# Patient Record
Sex: Female | Born: 1950 | Race: Black or African American | Hispanic: No | Smoking: Never smoker
Health system: Southern US, Community
[De-identification: ages and names within clinical notes are randomized; demographics above are authoritative.]

## PROBLEM LIST (undated history)

## (undated) DIAGNOSIS — H409 Unspecified glaucoma: Secondary | ICD-10-CM

## (undated) DIAGNOSIS — G4730 Sleep apnea, unspecified: Secondary | ICD-10-CM

## (undated) DIAGNOSIS — Z9889 Other specified postprocedural states: Secondary | ICD-10-CM

## (undated) DIAGNOSIS — J309 Allergic rhinitis, unspecified: Secondary | ICD-10-CM

## (undated) DIAGNOSIS — E119 Type 2 diabetes mellitus without complications: Secondary | ICD-10-CM

## (undated) DIAGNOSIS — R112 Nausea with vomiting, unspecified: Secondary | ICD-10-CM

## (undated) DIAGNOSIS — J42 Unspecified chronic bronchitis: Secondary | ICD-10-CM

## (undated) DIAGNOSIS — K219 Gastro-esophageal reflux disease without esophagitis: Secondary | ICD-10-CM

## (undated) DIAGNOSIS — I10 Essential (primary) hypertension: Secondary | ICD-10-CM

## (undated) DIAGNOSIS — Z950 Presence of cardiac pacemaker: Secondary | ICD-10-CM

## (undated) DIAGNOSIS — I509 Heart failure, unspecified: Secondary | ICD-10-CM

## (undated) DIAGNOSIS — M109 Gout, unspecified: Secondary | ICD-10-CM

## (undated) DIAGNOSIS — M1990 Unspecified osteoarthritis, unspecified site: Secondary | ICD-10-CM

## (undated) DIAGNOSIS — R001 Bradycardia, unspecified: Secondary | ICD-10-CM

## (undated) HISTORY — DX: Unspecified glaucoma: H40.9

## (undated) HISTORY — PX: DILATION AND CURETTAGE OF UTERUS: SHX78

## (undated) HISTORY — DX: Allergic rhinitis, unspecified: J30.9

## (undated) HISTORY — DX: Heart failure, unspecified: I50.9

## (undated) HISTORY — DX: Gout, unspecified: M10.9

## (undated) HISTORY — PX: BREAST BIOPSY: SHX20

## (undated) HISTORY — PX: TUBAL LIGATION: SHX77

## (undated) HISTORY — PX: BREAST LUMPECTOMY: SHX2

## (undated) HISTORY — PX: ABDOMINAL HYSTERECTOMY: SHX81

---

## 1998-09-15 DIAGNOSIS — Z9189 Other specified personal risk factors, not elsewhere classified: Secondary | ICD-10-CM | POA: Insufficient documentation

## 1998-12-29 ENCOUNTER — Emergency Department (HOSPITAL_COMMUNITY): Admission: EM | Admit: 1998-12-29 | Discharge: 1998-12-29 | Payer: Self-pay | Admitting: Emergency Medicine

## 1999-02-01 ENCOUNTER — Emergency Department (HOSPITAL_COMMUNITY): Admission: EM | Admit: 1999-02-01 | Discharge: 1999-02-01 | Payer: Self-pay | Admitting: Emergency Medicine

## 1999-11-26 ENCOUNTER — Emergency Department (HOSPITAL_COMMUNITY): Admission: EM | Admit: 1999-11-26 | Discharge: 1999-11-26 | Payer: Self-pay | Admitting: Emergency Medicine

## 1999-11-26 ENCOUNTER — Encounter: Payer: Self-pay | Admitting: Emergency Medicine

## 2000-08-19 ENCOUNTER — Emergency Department (HOSPITAL_COMMUNITY): Admission: EM | Admit: 2000-08-19 | Discharge: 2000-08-19 | Payer: Self-pay | Admitting: Emergency Medicine

## 2001-01-03 HISTORY — PX: GANGLION CYST EXCISION: SHX1691

## 2001-01-16 ENCOUNTER — Encounter: Payer: Self-pay | Admitting: *Deleted

## 2001-01-16 ENCOUNTER — Ambulatory Visit (HOSPITAL_COMMUNITY): Admission: RE | Admit: 2001-01-16 | Discharge: 2001-01-16 | Payer: Self-pay | Admitting: *Deleted

## 2001-01-27 ENCOUNTER — Emergency Department (HOSPITAL_COMMUNITY): Admission: EM | Admit: 2001-01-27 | Discharge: 2001-01-27 | Payer: Self-pay | Admitting: Emergency Medicine

## 2001-01-27 ENCOUNTER — Encounter: Payer: Self-pay | Admitting: Emergency Medicine

## 2001-04-23 ENCOUNTER — Ambulatory Visit (HOSPITAL_BASED_OUTPATIENT_CLINIC_OR_DEPARTMENT_OTHER): Admission: RE | Admit: 2001-04-23 | Discharge: 2001-04-23 | Payer: Self-pay | Admitting: General Surgery

## 2001-04-23 ENCOUNTER — Encounter (INDEPENDENT_AMBULATORY_CARE_PROVIDER_SITE_OTHER): Payer: Self-pay | Admitting: *Deleted

## 2001-08-18 ENCOUNTER — Emergency Department (HOSPITAL_COMMUNITY): Admission: EM | Admit: 2001-08-18 | Discharge: 2001-08-18 | Payer: Self-pay | Admitting: Emergency Medicine

## 2001-08-18 ENCOUNTER — Encounter: Payer: Self-pay | Admitting: Emergency Medicine

## 2001-12-23 ENCOUNTER — Encounter: Payer: Self-pay | Admitting: Emergency Medicine

## 2001-12-23 ENCOUNTER — Emergency Department (HOSPITAL_COMMUNITY): Admission: EM | Admit: 2001-12-23 | Discharge: 2001-12-23 | Payer: Self-pay | Admitting: Emergency Medicine

## 2002-03-24 ENCOUNTER — Emergency Department (HOSPITAL_COMMUNITY): Admission: EM | Admit: 2002-03-24 | Discharge: 2002-03-24 | Payer: Self-pay | Admitting: Emergency Medicine

## 2002-03-24 ENCOUNTER — Encounter: Payer: Self-pay | Admitting: Emergency Medicine

## 2002-11-03 ENCOUNTER — Emergency Department (HOSPITAL_COMMUNITY): Admission: EM | Admit: 2002-11-03 | Discharge: 2002-11-04 | Payer: Self-pay | Admitting: Emergency Medicine

## 2002-12-22 ENCOUNTER — Emergency Department (HOSPITAL_COMMUNITY): Admission: EM | Admit: 2002-12-22 | Discharge: 2002-12-22 | Payer: Self-pay | Admitting: Emergency Medicine

## 2003-03-16 ENCOUNTER — Emergency Department (HOSPITAL_COMMUNITY): Admission: AD | Admit: 2003-03-16 | Discharge: 2003-03-16 | Payer: Self-pay | Admitting: Family Medicine

## 2003-07-13 ENCOUNTER — Emergency Department (HOSPITAL_COMMUNITY): Admission: EM | Admit: 2003-07-13 | Discharge: 2003-07-13 | Payer: Self-pay | Admitting: Emergency Medicine

## 2003-09-12 ENCOUNTER — Ambulatory Visit: Payer: Self-pay | Admitting: Internal Medicine

## 2003-09-30 ENCOUNTER — Ambulatory Visit: Payer: Self-pay | Admitting: *Deleted

## 2003-10-27 ENCOUNTER — Ambulatory Visit: Payer: Self-pay | Admitting: Family Medicine

## 2003-12-06 ENCOUNTER — Emergency Department (HOSPITAL_COMMUNITY): Admission: EM | Admit: 2003-12-06 | Discharge: 2003-12-06 | Payer: Self-pay | Admitting: Family Medicine

## 2004-02-22 ENCOUNTER — Emergency Department (HOSPITAL_COMMUNITY): Admission: EM | Admit: 2004-02-22 | Discharge: 2004-02-22 | Payer: Self-pay | Admitting: Emergency Medicine

## 2004-05-06 ENCOUNTER — Ambulatory Visit: Payer: Self-pay | Admitting: Family Medicine

## 2004-08-28 ENCOUNTER — Emergency Department (HOSPITAL_COMMUNITY): Admission: EM | Admit: 2004-08-28 | Discharge: 2004-08-28 | Payer: Self-pay | Admitting: Family Medicine

## 2004-08-28 ENCOUNTER — Emergency Department (HOSPITAL_COMMUNITY): Admission: EM | Admit: 2004-08-28 | Discharge: 2004-08-28 | Payer: Self-pay | Admitting: Emergency Medicine

## 2004-10-28 ENCOUNTER — Ambulatory Visit: Payer: Self-pay | Admitting: Family Medicine

## 2005-02-09 ENCOUNTER — Emergency Department (HOSPITAL_COMMUNITY): Admission: EM | Admit: 2005-02-09 | Discharge: 2005-02-09 | Payer: Self-pay | Admitting: Emergency Medicine

## 2005-04-07 ENCOUNTER — Emergency Department (HOSPITAL_COMMUNITY): Admission: EM | Admit: 2005-04-07 | Discharge: 2005-04-07 | Payer: Self-pay | Admitting: Emergency Medicine

## 2005-06-24 ENCOUNTER — Ambulatory Visit: Payer: Self-pay | Admitting: Internal Medicine

## 2005-10-26 ENCOUNTER — Ambulatory Visit: Payer: Self-pay | Admitting: Family Medicine

## 2006-01-04 ENCOUNTER — Emergency Department (HOSPITAL_COMMUNITY): Admission: EM | Admit: 2006-01-04 | Discharge: 2006-01-04 | Payer: Self-pay | Admitting: Family Medicine

## 2006-04-12 ENCOUNTER — Ambulatory Visit: Payer: Self-pay | Admitting: Internal Medicine

## 2006-09-15 DIAGNOSIS — J309 Allergic rhinitis, unspecified: Secondary | ICD-10-CM | POA: Insufficient documentation

## 2006-09-15 DIAGNOSIS — I10 Essential (primary) hypertension: Secondary | ICD-10-CM | POA: Insufficient documentation

## 2006-09-19 DIAGNOSIS — J45909 Unspecified asthma, uncomplicated: Secondary | ICD-10-CM

## 2006-09-20 ENCOUNTER — Encounter (INDEPENDENT_AMBULATORY_CARE_PROVIDER_SITE_OTHER): Payer: Self-pay | Admitting: *Deleted

## 2006-10-25 ENCOUNTER — Emergency Department (HOSPITAL_COMMUNITY): Admission: EM | Admit: 2006-10-25 | Discharge: 2006-10-25 | Payer: Self-pay | Admitting: Family Medicine

## 2006-11-01 ENCOUNTER — Encounter: Payer: Self-pay | Admitting: Internal Medicine

## 2006-11-01 ENCOUNTER — Ambulatory Visit: Payer: Self-pay | Admitting: Nurse Practitioner

## 2007-06-05 ENCOUNTER — Telehealth (INDEPENDENT_AMBULATORY_CARE_PROVIDER_SITE_OTHER): Payer: Self-pay | Admitting: Internal Medicine

## 2007-06-13 ENCOUNTER — Encounter (INDEPENDENT_AMBULATORY_CARE_PROVIDER_SITE_OTHER): Payer: Self-pay | Admitting: *Deleted

## 2007-07-09 ENCOUNTER — Emergency Department (HOSPITAL_COMMUNITY): Admission: EM | Admit: 2007-07-09 | Discharge: 2007-07-09 | Payer: Self-pay | Admitting: Emergency Medicine

## 2007-07-26 ENCOUNTER — Ambulatory Visit: Payer: Self-pay | Admitting: Internal Medicine

## 2007-07-26 LAB — CONVERTED CEMR LAB
Blood in Urine, dipstick: NEGATIVE
Glucose, Urine, Semiquant: NEGATIVE
Nitrite: NEGATIVE
Protein, U semiquant: 30
Specific Gravity, Urine: 1.025
Urobilinogen, UA: 0.2

## 2007-07-30 ENCOUNTER — Emergency Department (HOSPITAL_COMMUNITY): Admission: EM | Admit: 2007-07-30 | Discharge: 2007-07-30 | Payer: Self-pay | Admitting: Emergency Medicine

## 2008-03-14 ENCOUNTER — Ambulatory Visit: Payer: Self-pay | Admitting: Internal Medicine

## 2008-03-14 DIAGNOSIS — B370 Candidal stomatitis: Secondary | ICD-10-CM

## 2008-06-17 ENCOUNTER — Ambulatory Visit: Payer: Self-pay | Admitting: Internal Medicine

## 2008-06-17 DIAGNOSIS — J019 Acute sinusitis, unspecified: Secondary | ICD-10-CM

## 2008-09-27 ENCOUNTER — Emergency Department (HOSPITAL_COMMUNITY): Admission: EM | Admit: 2008-09-27 | Discharge: 2008-09-27 | Payer: Self-pay | Admitting: Emergency Medicine

## 2009-01-18 ENCOUNTER — Emergency Department (HOSPITAL_COMMUNITY): Admission: EM | Admit: 2009-01-18 | Discharge: 2009-01-18 | Payer: Self-pay | Admitting: Emergency Medicine

## 2009-02-10 ENCOUNTER — Ambulatory Visit: Payer: Self-pay | Admitting: Internal Medicine

## 2009-02-10 DIAGNOSIS — J209 Acute bronchitis, unspecified: Secondary | ICD-10-CM

## 2009-06-22 ENCOUNTER — Ambulatory Visit: Payer: Self-pay | Admitting: Internal Medicine

## 2009-09-24 ENCOUNTER — Ambulatory Visit: Payer: Self-pay | Admitting: Internal Medicine

## 2010-01-05 ENCOUNTER — Ambulatory Visit
Admission: RE | Admit: 2010-01-05 | Discharge: 2010-01-05 | Payer: Self-pay | Source: Home / Self Care | Attending: Internal Medicine | Admitting: Internal Medicine

## 2010-01-20 ENCOUNTER — Ambulatory Visit: Admit: 2010-01-20 | Payer: Self-pay | Admitting: Internal Medicine

## 2010-01-27 ENCOUNTER — Emergency Department (HOSPITAL_COMMUNITY)
Admission: EM | Admit: 2010-01-27 | Discharge: 2010-01-27 | Payer: Self-pay | Source: Home / Self Care | Admitting: Family Medicine

## 2010-02-04 NOTE — Assessment & Plan Note (Signed)
Summary: F/U 2 MONTHS ASTHMA & HTN PER DR MULBERRY / NS   Vital Signs:  Patient profile:   60 year old female Height:      64.5 inches Weight:      209 pounds BMI:     35.45 Temp:     97.4 degrees F oral Pulse rate:   72 / minute Pulse rhythm:   regular BP sitting:   124 / 80  (left arm) Cuff size:   large  Vitals Entered By: CMA Student Linzie Collin CC: F/U for HTN and Asthma, patient has been conjested for about a week with cough Is Patient Diabetic? No Pain Assessment Patient in pain? no       Does patient need assistance? Functional Status Self care Ambulation Normal   CC:  F/U for HTN and Asthma and patient has been conjested for about a week with cough.  History of Present Illness: 1.  Hypertension:  Taking Lisinopril.  No problems.  Later, find out she is still taking HCTZ--was to change to as needed use only.  For some reason, is no longer having problems with frequent urination with this, so no problem now.  Has also cut back on eating.  No significant physical activity.  2.  Asthma:  Using Advair two times a day now.  Has helped, but still with fairly significant symptoms.  States using Singulair, nasal steroids and Xyzal daily as well.  When temp dropped lower last week, developed cough, some congestion, dypsnea, chest feels tighter more of the time.  Asthma History    Asthma Control Assessment:    Age range: 12+ years    Symptoms: 0-2 days/week    Nighttime Awakenings: 1-3/week    Interferes w/ normal activity: some limitations    SABA use (not for EIB): >2 days/week    Asthma Control Assessment: Not Well Controlled   Current Medications (verified): 1)  Singulair 10 Mg  Tabs (Montelukast Sodium) 2)  Advair Diskus 250-50 Mcg/dose  Misc (Fluticasone-Salmeterol) .... One Puff Two Times A Day 3)  Hydrochlorothiazide 25 Mg  Tabs (Hydrochlorothiazide) .Marland Kitchen.. 1 Tab By Mouth Daily As Needed Edema--As Needed Only 4)  Allegra 180 Mg  Tabs (Fexofenadine Hcl) ....  One By Mouth Qam 5)  Norvasc 10 Mg  Tabs (Amlodipine Besylate) .Marland Kitchen.. 1 Tab By Mouth Daily 6)  Ventolin Hfa 108 (90 Base) Mcg/act  Aers (Albuterol Sulfate) .... 2 Puffs Q 4 Hours As Needed Wheeze and Chest Tightness 7)  Nasonex 50 Mcg/act Susp (Mometasone Furoate) .... 2 Sprays Each Nostril Daily 8)  Lisinopril 10 Mg Tabs (Lisinopril) .Marland Kitchen.. 1 Tab By Mouth Daily  Allergies (verified): 1)  ! Pcn 2)  ! * Avelox  Physical Exam  General:  Crying after receiving an emergency call from family--20 yo ?grandson apparently arrested--pt. very upset and needed to leave quickly Ears:  External ear exam shows no significant lesions or deformities.  Otoscopic examination reveals clear canals, tympanic membranes are intact bilaterally without bulging, retraction, inflammation or discharge. Hearing is grossly normal bilaterally. Nose:  clear discharge, mild swelling of mucosa Mouth:  pharynx pink and moist.   Neck:  No deformities, masses, or tenderness noted. Lungs:  Normal respiratory effort, chest expands symmetrically. Lungs are clear to auscultation, no crackles or wheezes.   Heart:  Normal rate and regular rhythm. S1 and S2 normal without gallop, murmur, click, rub or other extra sounds.   Impression & Recommendations:  Problem # 1:  HYPERTENSION (ICD-401.9) Better control--stay on all 3  meds--to return at later date for labs Her updated medication list for this problem includes:    Hydrochlorothiazide 25 Mg Tabs (Hydrochlorothiazide) .Marland Kitchen... 1 tab by mouth daily    Norvasc 10 Mg Tabs (Amlodipine besylate) .Marland Kitchen... 1 tab by mouth daily    Lisinopril 10 Mg Tabs (Lisinopril) .Marland Kitchen... 1 tab by mouth daily  Problem # 2:  ASTHMA (ICD-493.90) Not controlled Increase to Advair 500/50 two times a day  The following medications were removed from the medication list:    Advair Diskus 250-50 Mcg/dose Misc (Fluticasone-salmeterol) ..... One puff two times a day Her updated medication list for this problem  includes:    Singulair 10 Mg Tabs (Montelukast sodium)    Ventolin Hfa 108 (90 Base) Mcg/act Aers (Albuterol sulfate) .Marland Kitchen... 2 puffs q 4 hours as needed wheeze and chest tightness    Flovent Diskus 250 Mcg/blist Aepb (Fluticasone propionate (inhal)) .Marland Kitchen... 1 inhalation two times a day with advair 250 until can get 500 mg disc advair    Advair Diskus 500-50 Mcg/dose Aepb (Fluticasone-salmeterol) .Marland Kitchen... 1 inhalation two times a day  Complete Medication List: 1)  Singulair 10 Mg Tabs (Montelukast sodium) 2)  Hydrochlorothiazide 25 Mg Tabs (Hydrochlorothiazide) .Marland Kitchen.. 1 tab by mouth daily 3)  Xyzal 5 Mg Tabs (Levocetirizine dihydrochloride) .Marland Kitchen.. 1 tab by mouth daily 4)  Norvasc 10 Mg Tabs (Amlodipine besylate) .Marland Kitchen.. 1 tab by mouth daily 5)  Ventolin Hfa 108 (90 Base) Mcg/act Aers (Albuterol sulfate) .... 2 puffs q 4 hours as needed wheeze and chest tightness 6)  Nasonex 50 Mcg/act Susp (Mometasone furoate) .... 2 sprays each nostril daily 7)  Lisinopril 10 Mg Tabs (Lisinopril) .Marland Kitchen.. 1 tab by mouth daily 8)  Flovent Diskus 250 Mcg/blist Aepb (Fluticasone propionate (inhal)) .Marland Kitchen.. 1 inhalation two times a day with advair 250 until can get 500 mg disc advair 9)  Advair Diskus 500-50 Mcg/dose Aepb (Fluticasone-salmeterol) .Marland Kitchen.. 1 inhalation two times a day  Patient Instructions: 1)  Lab visit in next 2 weeks for BMET--v58.69 2)  Follow up with Dr. Delrae Alfred in 2 months --asthma 3)  NO ONE SHOULD SMOKE IN THE HOUSE OR CAR Prescriptions: HYDROCHLOROTHIAZIDE 25 MG  TABS (HYDROCHLOROTHIAZIDE) 1 tab by mouth daily  #30 x 11   Entered and Authorized by:   Julieanne Manson MD   Signed by:   Julieanne Manson MD on 09/24/2009   Method used:   Faxed to ...       [facility] - [facility] (retail)       [address] [place], [place]  [address]       Ph: [phone]       Fax: [phone]   RxID:   [id number] ADVAIR DISKUS 500-50 MCG/DOSE AEPB (FLUTICASONE-SALMETEROL) 1  inhalation two times a day  #1 x 11   Entered and Authorized by:   Julieanne Manson MD   Signed by:   Julieanne Manson MD on 09/24/2009   Method used:   Faxed to ...       [facility] - [facility] (retail)       [address] [place], [place]  [address]       Ph: [phone]       Fax: [phone]   RxID:   [id number] FLOVENT DISKUS 250 MCG/BLIST AEPB (FLUTICASONE PROPIONATE (INHAL)) 1 inhalation two times a day with Advair 250 until can get 500 mg disc Advair  #2 x 0  Entered and Authorized by:   Julieanne Manson MD   Signed by:   Julieanne Manson MD on 09/24/2009   Method used:   Samples Given   RxID:   [id number]

## 2010-02-04 NOTE — Assessment & Plan Note (Signed)
Summary: FOLLOW UP VISIT/ GK   Vital Signs:  Patient profile:   60 year old female Weight:      216.5 pounds Temp:     97.7 degrees F Pulse rate:   79 / minute Pulse rhythm:   regular Resp:     20 per minute BP sitting:   156 / 99  (left arm) Cuff size:   large  Vitals Entered By: Vesta Mixer CMA (February 10, 2009 3:54 PM) CC: Went to ED 01/18/09 was told she had bronchitis and feels like it has not gone away completely. Is Patient Diabetic? No Pain Assessment Patient in pain? no       Does patient need assistance? Ambulation Normal   CC:  Went to ED 01/18/09 was told she had bronchitis and feels like it has not gone away completely.Marland Kitchen  History of Present Illness: Went to ED at [facility] 01/18/2009 for cough and pt. also felt she was having palpitations and chest discomfort.  Pt. diagnosed with Asthmatic bronchitis.  Was given prednisone burst and taper, doxycycline and nebulized albuterol.  Interestingly, no note of wheezing in ED provider PE.  Was better when was taking the Prednisone.  Coughing has worsened since about 2 days after prednisone.  Looking through med refill record from pharmacy, pt. has not filled meds at least 4 months in last year--she states she was using meds from previous refills--but those were also at [facility] pharmacy--my refills should have cancelled out the old.    No fever.  No further palpitations.  Discussed her use of rescue inhalers may have caused the palpitations--EKG reportedly okay in ED--she was told this was from the asthma.  Pt. does admit that she was a bit shaky/tremulous as well.  Allergies (verified): 1)  ! Pcn 2)  ! * Avelox  Physical Exam  Ears:  External ear exam shows no significant lesions or deformities.  Otoscopic examination reveals clear canals, tympanic membranes are intact bilaterally without bulging, retraction, inflammation or discharge. Hearing is grossly normal bilaterally. Nose:  External nasal examination shows  no deformity or inflammation. Nasal mucosa are pink and moist without lesions or exudates. Mouth:  pharynx pink and moist.   Neck:  No deformities, masses, or tenderness noted. Lungs:  Normal respiratory effort, chest expands symmetrically. Lungs are clear to auscultation, no crackles or wheezes. Heart:  Normal rate and regular rhythm. S1 and S2 normal without gallop, murmur, click, rub or other extra sounds.  Radial pulses normal and equal.   Impression & Recommendations:  Problem # 1:  ACUTE BRONCHITIS (ICD-466.0) No wheezing on exam today-feel she is improving and needs something to tame dry cough--start Tessalon perles. Pt. to get back on meds and stay on them. Her updated medication list for this problem includes:    Singulair 10 Mg Tabs (Montelukast sodium)    Advair Diskus 250-50 Mcg/dose Misc (Fluticasone-salmeterol) ..... One puff two times a day    Ventolin Hfa 108 (90 Base) Mcg/act Aers (Albuterol sulfate) .Marland Kitchen... 2 puffs q 4 hours as needed wheeze and chest tightness    Tessalon Perles 100 Mg Caps (Benzonatate) .Marland Kitchen... 1 perle by mouth every 8 hours as needed for cough  Problem # 2:  ASTHMA (ICD-493.90) As above Her updated medication list for this problem includes:    Singulair 10 Mg Tabs (Montelukast sodium)    Advair Diskus 250-50 Mcg/dose Misc (Fluticasone-salmeterol) ..... One puff two times a day    Ventolin Hfa 108 (90 Base) Mcg/act Aers (Albuterol  sulfate) .Marland Kitchen... 2 puffs q 4 hours as needed wheeze and chest tightness  Problem # 3:  HYPERTENSION (ICD-401.9) Again, discussed compliance. Her updated medication list for this problem includes:    Hydrochlorothiazide 25 Mg Tabs (Hydrochlorothiazide) .Marland Kitchen... 1 tab by mouth daily as needed edema    Norvasc 10 Mg Tabs (Amlodipine besylate) .Marland Kitchen... 1 tab by mouth daily  Complete Medication List: 1)  Singulair 10 Mg Tabs (Montelukast sodium) 2)  Advair Diskus 250-50 Mcg/dose Misc (Fluticasone-salmeterol) .... One puff two times a  day 3)  Hydrochlorothiazide 25 Mg Tabs (Hydrochlorothiazide) .Marland Kitchen.. 1 tab by mouth daily as needed edema 4)  Allegra 180 Mg Tabs (Fexofenadine hcl) .... One by mouth qam 5)  Norvasc 10 Mg Tabs (Amlodipine besylate) .Marland Kitchen.. 1 tab by mouth daily 6)  Ventolin Hfa 108 (90 Base) Mcg/act Aers (Albuterol sulfate) .... 2 puffs q 4 hours as needed wheeze and chest tightness 7)  Nasonex 50 Mcg/act Susp (Mometasone furoate) .... 2 sprays each nostril daily 8)  Nystatin 100000 Unit/ml Susp (Nystatin) .... 5 cc swish and swallow 4 times daily for 10 days 9)  Tessalon Perles 100 Mg Caps (Benzonatate) .Marland Kitchen.. 1 perle by mouth every 8 hours as needed for cough  Patient Instructions: 1)  Follow up with Dr. Delrae Alfred in 4 months --asthma Prescriptions: TESSALON PERLES 100 MG CAPS (BENZONATATE) 1 perle by mouth every 8 hours as needed for cough  #30 x 0   Entered and Authorized by:   Julieanne Manson MD   Signed by:   Julieanne Manson MD on 02/10/2009   Method used:   Faxed to ...       [facility] - [facility] (retail)       [address] [place], [place]  [address]       Ph: [phone]       Fax: [phone]   RxID:   [id number]    Vital Signs:  Patient profile:   60 year old female Weight:      216.5 pounds Temp:     97.7 degrees F Pulse rate:   79 / minute Pulse rhythm:   regular Resp:     20 per minute BP sitting:   156 / 99  (left arm) Cuff size:   large  Vitals Entered By: Vesta Mixer CMA (February 10, 2009 3:54 PM)

## 2010-02-04 NOTE — Assessment & Plan Note (Signed)
Summary: 4 month fu onasthma////kt//RESCH//GK   Vital Signs:  Patient profile:   60 year old female Weight:      215 pounds BMI:     36.47 Temp:     97.7 degrees F Pulse rate:   59 / minute Pulse rhythm:   regular Resp:     20 per minute BP sitting:   140 / 79  (left arm) Cuff size:   large  Vitals Entered By: Vesta Mixer CMA (June 22, 2009 10:58 AM)  Serial Vital Signs/Assessments:  Comments: Peak flows:  350 L/min   370 L/min  390 L/min By: Julieanne Manson MD   CC: f/u onasthma Is Patient Diabetic? No Pain Assessment Patient in pain? no       Does patient need assistance? Ambulation Normal   CC:  f/u onasthma.  History of Present Illness: 1.  Hypertension:  MIsses HCTZ frequently as it makes her urinate and she's afraid to leave home.   Cannot recall what meds she has tried in past--had a beta blocker that caused nausea and vomiting.  Cannot recall the name Lisinopril.  2.  Asthma:  Advair--only using once daily, sometimes twice.  Thinks she would use a rescue inhaler every day if she had one--out currently.  Still coughing a fair amt.  Feels short of breath a bit every day.  3.  Allergies:  has a lot of trouble with allergies currently and particularly when very hot.  States she is using her Singulair, Allegra, Nasocort regularly.    Asthma History    Initial Asthma Severity Rating:    Age range: 12+ years    Symptoms: daily    Nighttime Awakenings: >1/week but not nightly    Interferes w/ normal activity: some limitations    SABA use (not for EIB): daily    Asthma Severity Assessment: Moderate Persistent    Allergies (verified): 1)  ! Pcn 2)  ! * Avelox  Physical Exam  General:  NAD Eyes:  No corneal or conjunctival inflammation noted. EOMI. Perrla. Funduscopic exam benign, without hemorrhages, exudates or papilledema. Vision grossly normal. Ears:  External ear exam shows no significant lesions or deformities.  Otoscopic examination reveals  clear canals, tympanic membranes are intact bilaterally without bulging, retraction, inflammation or discharge. Hearing is grossly normal bilaterally. Nose:  nasal dischargemucosal pallor.  Some dryness and irritation of mucosa Mouth:  pharynx pink and moist.   Neck:  No deformities, masses, or tenderness noted. Lungs:  Normal respiratory effort, chest expands symmetrically. Lungs are clear to auscultation, no crackles or wheezes.  Air movement currently good. Heart:  Normal rate and regular rhythm. S1 and S2 normal without gallop, murmur, click, rub or other extra sounds.   Impression & Recommendations:  Problem # 1:  HYPERTENSION (ICD-401.9) HCTZ to be just as needed for edema Start Lisinopril--hopefully will tolerate so she can take daily Her updated medication list for this problem includes:    Hydrochlorothiazide 25 Mg Tabs (Hydrochlorothiazide) .Marland Kitchen... 1 tab by mouth daily as needed edema--as needed only    Norvasc 10 Mg Tabs (Amlodipine besylate) .Marland Kitchen... 1 tab by mouth daily    Lisinopril 10 Mg Tabs (Lisinopril) .Marland Kitchen... 1 tab by mouth daily  Problem # 2:  ASTHMA (ICD-493.90) Discussed needs to use Advair two times a day every day Plan sheet printed and given Her updated medication list for this problem includes:    Singulair 10 Mg Tabs (Montelukast sodium)    Advair Diskus 250-50 Mcg/dose Misc (Fluticasone-salmeterol) ..... One  puff two times a day    Ventolin Hfa 108 (90 Base) Mcg/act Aers (Albuterol sulfate) .Marland Kitchen... 2 puffs q 4 hours as needed wheeze and chest tightness  Problem # 3:  ALLERGIC RHINITIS (ICD-477.9) Add saline nasal spray as needed  Her updated medication list for this problem includes:    Allegra 180 Mg Tabs (Fexofenadine hcl) ..... One by mouth qam    Nasonex 50 Mcg/act Susp (Mometasone furoate) .Marland Kitchen... 2 sprays each nostril daily  Complete Medication List: 1)  Singulair 10 Mg Tabs (Montelukast sodium) 2)  Advair Diskus 250-50 Mcg/dose Misc (Fluticasone-salmeterol)  .... One puff two times a day 3)  Hydrochlorothiazide 25 Mg Tabs (Hydrochlorothiazide) .Marland Kitchen.. 1 tab by mouth daily as needed edema--as needed only 4)  Allegra 180 Mg Tabs (Fexofenadine hcl) .... One by mouth qam 5)  Norvasc 10 Mg Tabs (Amlodipine besylate) .Marland Kitchen.. 1 tab by mouth daily 6)  Ventolin Hfa 108 (90 Base) Mcg/act Aers (Albuterol sulfate) .... 2 puffs q 4 hours as needed wheeze and chest tightness 7)  Nasonex 50 Mcg/act Susp (Mometasone furoate) .... 2 sprays each nostril daily 8)  Lisinopril 10 Mg Tabs (Lisinopril) .Marland Kitchen.. 1 tab by mouth daily  Asthma Management Plan    Asthma Severity: Moderate Persistent    Personal best PEF: 390 liters/minute    Predicted PEF: 442 liters/minute    Working PEF: 390 liters/minute    Plan based on PEF formula: [place] [employer] Zone: (Range: 350 to 450) SINGULAIR 10 MG  TABS ADVAIR DISKUS 250-50 MCG/DOSE  MISC   Yellow Zone: VENTOLIN HFA 108 (90 BASE) MCG/ACT  AERS:  2 puffs every 4 hours as needed  Red Zone: VENTOLIN HFA 108 (90 BASE) MCG/ACT  AERS Call your physician for shortness of breath.    Patient Instructions: 1)  BP check with BMET in 2 weeks. 2)  Follow up with Dr. Delrae Alfred in 2 months-asthma and htn Prescriptions: VENTOLIN HFA 108 (90 BASE) MCG/ACT  AERS (ALBUTEROL SULFATE) 2 puffs q 4 hours as needed wheeze and chest tightness  #1 x 0   Entered and Authorized by:   Julieanne Manson MD   Signed by:   Julieanne Manson MD on 06/22/2009   Method used:   Faxed to ...       [facility] - [facility] (retail)       [address] [place], [place]  [address]       Ph: [phone]       Fax: [phone]   RxID:   [id number] HYDROCHLOROTHIAZIDE 25 MG  TABS (HYDROCHLOROTHIAZIDE) 1 tab by mouth daily as needed edema--as needed only  #30 x 1   Entered and Authorized by:   Julieanne Manson MD   Signed by:   Julieanne Manson MD on 06/22/2009   Method used:   Faxed to ...       [facility] - [facility] (retail)       [address] [place], [place]  [address]       Ph: [phone]       Fax: [phone]   RxID:   [id number] LISINOPRIL 10 MG TABS (LISINOPRIL) 1 tab by mouth daily  #30 x 6   Entered and Authorized by:   Julieanne Manson MD   Signed by:   Julieanne Manson MD on 06/22/2009   Method used:   Faxed to .Marland KitchenMarland Kitchen  [facility] - [facility] (retail)       [address] [place], [place]  [address]       Ph: [phone]       Fax: [phone]   RxID:   [id number]   Appended Document: 4 month fu onasthma////kt//RESCH//GK nonsmoker

## 2010-02-04 NOTE — Assessment & Plan Note (Signed)
Summary: FU FOR ASTHMA AND BMET///KT   Vital Signs:  Patient profile:   60 year old female Weight:      209.13 pounds O2 Sat:      98 % on Room air Temp:     98.2 degrees F oral Pulse rate:   84 / minute Pulse rhythm:   regular Resp:     20 per minute BP sitting:   142 / 100  (left arm) Cuff size:   regular  Vitals Entered By: Hale Drone CMA (January 05, 2010 3:30 PM)  O2 Flow:  Room air CC: f/u on asthma and BMET.  Is Patient Diabetic? No Pain Assessment Patient in pain? no       Does patient need assistance? Functional Status Self care Ambulation Normal   Serial Vital Signs/Assessments:                                PEF    PreRx  PostRx Time      O2 Sat  O2 Type     L/min  L/min  L/min   By 3:35 PM   98  %   Room air                          Hale Drone CMA  Comments: 3:35 PM Peak Flow: 400 - 380 - 480 By: Hale Drone CMA    CC:  f/u on asthma and BMET. Marland Kitchen  History of Present Illness: 1.  Asthma:  Feels she is doing better.  Feels doing much better since started higher dosing of Advair.  Using rescue inhaler only 3-4 times weekly. Was using daily previously.  Denies missing asthma meds.    2.  Hypertension:  Has been missing meds and eating a lot of salty foods.    Asthma History    Asthma Control Assessment:    Age range: 12+ years    Symptoms: >2 days/week    Nighttime Awakenings: 0-2/month    Interferes w/ normal activity: no limitations    SABA use (not for EIB): >2 days/week    Asthma Control Assessment: Not Well Controlled    Current Medications (verified): 1)  Singulair 10 Mg  Tabs (Montelukast Sodium) 2)  Hydrochlorothiazide 25 Mg  Tabs (Hydrochlorothiazide) .Marland Kitchen.. 1 Tab By Mouth Daily 3)  Xyzal 5 Mg Tabs (Levocetirizine Dihydrochloride) .Marland Kitchen.. 1 Tab By Mouth Daily 4)  Norvasc 10 Mg  Tabs (Amlodipine Besylate) .Marland Kitchen.. 1 Tab By Mouth Daily 5)  Ventolin Hfa 108 (90 Base) Mcg/act  Aers (Albuterol Sulfate) .... 2 Puffs Q 4 Hours As Needed Wheeze and  Chest Tightness 6)  Nasonex 50 Mcg/act Susp (Mometasone Furoate) .... 2 Sprays Each Nostril Daily 7)  Lisinopril 10 Mg Tabs (Lisinopril) .Marland Kitchen.. 1 Tab By Mouth Daily 8)  Flovent Diskus 250 Mcg/blist Aepb (Fluticasone Propionate (Inhal)) .Marland Kitchen.. 1 Inhalation Two Times A Day With Advair 250 Until Can Get 500 Mg Disc Advair 9)  Advair Diskus 500-50 Mcg/dose Aepb (Fluticasone-Salmeterol) .Marland Kitchen.. 1 Inhalation Two Times A Day  Allergies (verified): 1)  ! Pcn 2)  ! * Avelox  Physical Exam  General:  NAD Lungs:  Normal respiratory effort, chest expands symmetrically. Lungs are clear to auscultation, no crackles or wheezes. Heart:  Normal rate and regular rhythm. S1 and S2 normal without gallop, murmur, click, rub or other extra sounds.  Radial pulses normal and equal  Impression & Recommendations:  Problem # 1:  ASTHMA (ICD-493.90) Definite improvement The following medications were removed from the medication list:    Flovent Diskus 250 Mcg/blist Aepb (Fluticasone propionate (inhal)) .Marland Kitchen... 1 inhalation two times a day with advair 250 until can get 500 mg disc advair Her updated medication list for this problem includes:    Singulair 10 Mg Tabs (Montelukast sodium)    Ventolin Hfa 108 (90 Base) Mcg/act Aers (Albuterol sulfate) .Marland Kitchen... 2 puffs q 4 hours as needed wheeze and chest tightness    Advair Diskus 500-50 Mcg/dose Aepb (Fluticasone-salmeterol) .Marland Kitchen... 1 inhalation two times a day  Orders: Peak Flow Rate (94150) Pulse Oximetry (single measurment) (96045)  Problem # 2:  HYPERTENSION (ICD-401.9) To be more compliant with meds Her updated medication list for this problem includes:    Hydrochlorothiazide 25 Mg Tabs (Hydrochlorothiazide) .Marland Kitchen... 1 tab by mouth daily    Norvasc 10 Mg Tabs (Amlodipine besylate) .Marland Kitchen... 1 tab by mouth daily    Lisinopril 10 Mg Tabs (Lisinopril) .Marland Kitchen... 1 tab by mouth daily  Complete Medication List: 1)  Singulair 10 Mg Tabs (Montelukast sodium) 2)  Hydrochlorothiazide  25 Mg Tabs (Hydrochlorothiazide) .Marland Kitchen.. 1 tab by mouth daily 3)  Loratadine 10 Mg Tabs (Loratadine) .Marland Kitchen.. 1 tab by mouth daily 4)  Norvasc 10 Mg Tabs (Amlodipine besylate) .Marland Kitchen.. 1 tab by mouth daily 5)  Ventolin Hfa 108 (90 Base) Mcg/act Aers (Albuterol sulfate) .... 2 puffs q 4 hours as needed wheeze and chest tightness 6)  Nasonex 50 Mcg/act Susp (Mometasone furoate) .... 2 sprays each nostril daily 7)  Lisinopril 10 Mg Tabs (Lisinopril) .Marland Kitchen.. 1 tab by mouth daily 8)  Advair Diskus 500-50 Mcg/dose Aepb (Fluticasone-salmeterol) .Marland Kitchen.. 1 inhalation two times a day  Asthma Management Plan    Asthma Severity: Moderate Persistent    Control Assessment: Not Well Controlled    Personal best PEF: 480 liters/minute    Predicted PEF: 439 liters/minute    Working PEF: 480 liters/minute    Plan based on PEF formula: [place] [employer] Zone: (Range: 380 to 480) SINGULAIR 10 MG  TABS ADVAIR DISKUS 500-50 MCG/DOSE AEPB VENTOLIN HFA 108 (90 BASE) MCG/ACT  AERS only as needed  Yellow Zone: VENTOLIN HFA 108 (90 BASE) MCG/ACT  AERS:  2 puffs every 4 hours as needed  Red Zone:  Call your physician for shortness of breath.   VENTOLIN HFA 108 (90 BASE) MCG/ACT  AERS:  2 puffs every 20 minutes x 3 doses as needed for acute flare  Patient Instructions: 1)  nurse visit for bp check in 2 weeks.  Needs CMET and UA same day. 2)  Follow up with Dr. Delrae Alfred in 4- 6 months Prescriptions: ADVAIR DISKUS 500-50 MCG/DOSE AEPB (FLUTICASONE-SALMETEROL) 1 inhalation two times a day  #1 x 11   Entered and Authorized by:   Julieanne Manson MD   Signed by:   Julieanne Manson MD on 01/05/2010   Method used:   Faxed to ...       [facility] (retail)       [address] [place], [place]  [address]       Ph: [phone]       Fax: [phone]   RxID:   [id number] LISINOPRIL 10 MG TABS (LISINOPRIL) 1 tab by mouth daily  #30 x 11   Entered and Authorized by:    Julieanne Manson MD   Signed by:   Lanora Manis  Jaheim Canino MD on 01/05/2010   Method used:   Faxed to ...       [facility] - [facility] (retail)       [address] [place], [place]  [address]       Ph: [phone]       Fax: [phone]   RxID:   [id number] NASONEX 50 MCG/ACT SUSP (MOMETASONE FUROATE) 2 sprays each nostril daily  #1 x 11   Entered and Authorized by:   Julieanne Manson MD   Signed by:   Julieanne Manson MD on 01/05/2010   Method used:   Faxed to ...       [facility] - [facility] (retail)       [address] [place], [place]  [address]       Ph: [phone]       Fax: [phone]   RxID:   [id number] VENTOLIN HFA 108 (90 BASE) MCG/ACT  AERS (ALBUTEROL SULFATE) 2 puffs q 4 hours as needed wheeze and chest tightness  #1 x 0   Entered and Authorized by:   Julieanne Manson MD   Signed by:   Julieanne Manson MD on 01/05/2010   Method used:   Faxed to ...       [facility] - [facility] (retail)       [address] [place], [place]  [address]       Ph: [phone]       Fax: [phone]   RxID:   [id number] NORVASC 10 MG  TABS (AMLODIPINE BESYLATE) 1 tab by mouth daily  #30 x 11   Entered and Authorized by:   Julieanne Manson MD   Signed by:   Julieanne Manson MD on 01/05/2010   Method used:   Faxed to ...       [facility] - [facility] (retail)       [address] [place], [place]  [address]       Ph: [phone]       Fax: [phone]   RxID:   [id number] HYDROCHLOROTHIAZIDE 25 MG  TABS (HYDROCHLOROTHIAZIDE) 1 tab by mouth daily  #30 x 11   Entered and Authorized by:   Julieanne Manson MD   Signed by:   Julieanne Manson MD on 01/05/2010   Method used:   Faxed to ...       [facility] - [facility] (retail)       [address] [place], [place]  [address]        Ph: [phone]       Fax: [phone]   RxID:   [id number] SINGULAIR 10 MG  TABS (MONTELUKAST SODIUM)   #30 x 11   Entered and Authorized by:   Julieanne Manson MD   Signed by:   Julieanne Manson MD on 01/05/2010   Method used:   Faxed to ...       [facility] - [facility] (retail)       [address] [place], [place]  [address]       Ph: [phone]       Fax: [phone]   RxID:   [id number] LORATADINE 10 MG TABS (LORATADINE) 1 tab by mouth daily  #30 x 11   Entered and  Authorized by:   Julieanne Manson MD   Signed by:   Julieanne Manson MD on 01/05/2010   Method used:   Faxed to ...       [facility] - [facility] (retail)       [address] [place], [place]  [address]       Ph: [phone]       Fax: [phone]   RxID:   [id number]    Orders Added: 1)  Peak Flow Rate [94150] 2)  Pulse Oximetry (single measurment) [94760] 3)  Est. Patient Level III [25366]   Immunization History:  Influenza Immunization History:    Influenza:  fluvax 3+ (09/03/2009)   Immunization History:  Influenza Immunization History:    Influenza:  Fluvax 3+ (09/03/2009)

## 2010-02-08 ENCOUNTER — Emergency Department (HOSPITAL_COMMUNITY): Payer: Self-pay

## 2010-02-08 ENCOUNTER — Emergency Department (HOSPITAL_COMMUNITY)
Admission: EM | Admit: 2010-02-08 | Discharge: 2010-02-08 | Disposition: A | Discharge status: Home / Self Care | Payer: Self-pay | Attending: Emergency Medicine | Admitting: Emergency Medicine

## 2010-02-08 DIAGNOSIS — J45909 Unspecified asthma, uncomplicated: Secondary | ICD-10-CM | POA: Insufficient documentation

## 2010-02-08 DIAGNOSIS — J40 Bronchitis, not specified as acute or chronic: Secondary | ICD-10-CM | POA: Insufficient documentation

## 2010-02-08 DIAGNOSIS — R079 Chest pain, unspecified: Secondary | ICD-10-CM | POA: Insufficient documentation

## 2010-02-08 DIAGNOSIS — I10 Essential (primary) hypertension: Secondary | ICD-10-CM | POA: Insufficient documentation

## 2010-02-08 DIAGNOSIS — Z79899 Other long term (current) drug therapy: Secondary | ICD-10-CM | POA: Insufficient documentation

## 2010-02-08 DIAGNOSIS — R0602 Shortness of breath: Secondary | ICD-10-CM | POA: Insufficient documentation

## 2010-03-10 ENCOUNTER — Telehealth (INDEPENDENT_AMBULATORY_CARE_PROVIDER_SITE_OTHER): Payer: Self-pay | Admitting: Internal Medicine

## 2010-03-16 ENCOUNTER — Encounter (INDEPENDENT_AMBULATORY_CARE_PROVIDER_SITE_OTHER): Payer: Self-pay | Admitting: Nurse Practitioner

## 2010-03-16 ENCOUNTER — Encounter: Payer: Self-pay | Admitting: Internal Medicine

## 2010-03-16 ENCOUNTER — Encounter (INDEPENDENT_AMBULATORY_CARE_PROVIDER_SITE_OTHER): Payer: Self-pay | Admitting: Internal Medicine

## 2010-03-16 LAB — CONVERTED CEMR LAB
ALT: 15 units/L (ref 0–35)
AST: 14 units/L (ref 0–37)
Albumin: 4.4 g/dL (ref 3.5–5.2)
Bilirubin Urine: NEGATIVE
Blood in Urine, dipstick: NEGATIVE
CO2: 26 meq/L (ref 19–32)
Calcium: 9.7 mg/dL (ref 8.4–10.5)
Glucose, Urine, Semiquant: NEGATIVE
Nitrite: NEGATIVE
Total Protein: 7.5 g/dL (ref 6.0–8.3)
Urobilinogen, UA: 0.2
pH: 5

## 2010-03-16 NOTE — Progress Notes (Signed)
Summary: call for what type appt needed next  Phone Note Call from Patient   Summary of Call: pat states needs labs ordered, is confused of what appts and when exactly needed Initial call taken by: Ernestine Mcmurray,  March 10, 2010 8:46 AM  Follow-up for Phone Call        Per last visit, pt. needed f/u with triage for BP recheck, CMET and UA.  Appt. scheduled 03/16/10.  Dutch Quint RN  March 11, 2010 2:53 PM

## 2010-03-22 LAB — POCT CARDIAC MARKERS
CKMB, poc: <1 ng/mL — ABNORMAL LOW (ref 1.0–8.0)
Myoglobin, poc: 45 ng/mL (ref 12–200)

## 2010-03-22 LAB — DIFFERENTIAL
Basophils Absolute: 0.1 10*3/uL (ref 0.0–0.1)
Basophils Relative: 2 % — ABNORMAL HIGH (ref 0–1)
Eosinophils Absolute: 0.1 10*3/uL (ref 0.0–0.7)
Lymphs Abs: 3 10*3/uL (ref 0.7–4.0)
Monocytes Absolute: 0.7 10*3/uL (ref 0.1–1.0)
Neutrophils Relative %: 49 % (ref 43–77)

## 2010-03-22 LAB — CBC
MCHC: 32.8 g/dL (ref 30.0–36.0)
Platelets: 311 10*3/uL (ref 150–400)
RBC: 5.33 MIL/uL — ABNORMAL HIGH (ref 3.87–5.11)

## 2010-03-22 LAB — COMPREHENSIVE METABOLIC PANEL
ALT: 21 U/L (ref 0–35)
AST: 17 U/L (ref 0–37)
CO2: 24 mEq/L (ref 19–32)
Calcium: 9.2 mg/dL (ref 8.4–10.5)
GFR calc Af Amer: >60 mL/min (ref >60)
Glucose, Bld: 125 mg/dL — ABNORMAL HIGH (ref 70–99)
Total Protein: 7.7 g/dL (ref 6.0–8.3)

## 2010-03-23 NOTE — Assessment & Plan Note (Signed)
Summary: BP recheck, CMET, UA  Nurse Visit   Vital Signs:  Patient profile:   60 year old female Weight:      206.8 pounds Temp:     97.7 degrees F oral Pulse rate:   64 / minute Pulse rhythm:   regular Resp:     24 per minute BP sitting:   143 / 73  (left arm) Cuff size:   regular  Vitals Entered By: Dutch Quint RN (March 16, 2010 11:45 AM)  Patient Instructions: 1)  Your blood pressure is much better 2)  Continue taking medications as ordered -- do not miss doses.  Make sure you get refills on time. 3)  Watch for "hidden" sources of salt -- canned foods, processed meats and cheeses, breads/rolls.  Read the labels to help you. 4)  We will let you know the results of your lab work. 5)  Keep scheduled appointment with Dr. Delrae Alfred. 6)  Call if anything changes or if you have any questions.   CC:  BP recheck, CMET, and UA.  History of Present Illness: 01/05/10  BP 142/100, P84.  Was advised to increase compliance with meds, no change at that time.  States she has been taking as ordered, doing "much better with it."   Review of Systems CV:  Denies CP or SOB, HA.  States "just tired from running after grandkids and great-grandkids.".   Impression & Recommendations:  Problem # 1:  HYPERTENSION (ICD-401.9)  BP much better Continue meds as ordered, maintain compliance Monitor sodium intake Keep scheduled appt. with provider CMET, UA done  Her updated medication list for this problem includes:    Hydrochlorothiazide 25 Mg Tabs (Hydrochlorothiazide) .Marland Kitchen... 1 tab by mouth daily    Norvasc 10 Mg Tabs (Amlodipine besylate) .Marland Kitchen... 1 tab by mouth daily    Lisinopril 10 Mg Tabs (Lisinopril) .Marland Kitchen... 1 tab by mouth daily  Orders: T-Comprehensive Metabolic Panel ([address]130-86578) UA Dipstick w/o Micro (automated)  (81003)  Complete Medication List: 1)  Singulair 10 Mg Tabs (Montelukast sodium) 2)  Hydrochlorothiazide 25 Mg Tabs (Hydrochlorothiazide) .Marland Kitchen.. 1 tab by mouth daily 3)   Loratadine 10 Mg Tabs (Loratadine) .Marland Kitchen.. 1 tab by mouth daily 4)  Norvasc 10 Mg Tabs (Amlodipine besylate) .Marland Kitchen.. 1 tab by mouth daily 5)  Ventolin Hfa 108 (90 Base) Mcg/act Aers (Albuterol sulfate) .... 2 puffs q 4 hours as needed wheeze and chest tightness 6)  Nasonex 50 Mcg/act Susp (Mometasone furoate) .... 2 sprays each nostril daily 7)  Lisinopril 10 Mg Tabs (Lisinopril) .Marland Kitchen.. 1 tab by mouth daily 8)  Advair Diskus 500-50 Mcg/dose Aepb (Fluticasone-salmeterol) .Marland Kitchen.. 1 inhalation two times a day  CC: BP recheck, CMET, UA Is Patient Diabetic? No Pain Assessment Patient in pain? no       Does patient need assistance? Functional Status Self care Ambulation Normal   Allergies: 1)  ! Pcn 2)  ! * Avelox Laboratory Results   Urine Tests  Date/Time Received: March 16, 2010 12:09 PM   Routine Urinalysis   Color: yellow Glucose: negative   (Normal Range: Negative) Bilirubin: negative   (Normal Range: Negative) Ketone: trace (5)   (Normal Range: Negative) Spec. Gravity: >=1.030   (Normal Range: 1.003-1.035) Blood: negative   (Normal Range: Negative) pH: 5.0   (Normal Range: 5.0-8.0) Protein: 30   (Normal Range: Negative) Urobilinogen: 0.2   (Normal Range: 0-1) Nitrite: negative   (Normal Range: Negative) Leukocyte Esterace: moderate   (Normal Range: Negative)  Orders Added: 1)  Est. Patient Level I [19147] 2)  T-Comprehensive Metabolic Panel [80053-22900] 3)  UA Dipstick w/o Micro (automated)  [81003]

## 2010-05-21 NOTE — Op Note (Signed)
[place]  Patient:    LARRA, CRUNKLETON Visit Number: [id number] MRN: [id number]          Service Type: DSU Location: [facility] Attending Physician:  Henrene Dodge Dictated by:   Anselm Pancoast. Zachery Dakins, M.D. Proc. Date: 04/23/01 Admit Date:  04/23/2001 Discharge Date: 04/23/2001                             Operative Report  PREOPERATIVE DIAGNOSIS:  Ganglion of right wrist, volar and distal radius.  POSTOPERATIVE DIAGNOSIS:  Ganglion of right wrist, volar and distal radius.  OPERATION:  Excision of ganglion.  ANESTHESIA:  Bier block with sedation, local supplemental at end.  SURGEON:  Anselm Pancoast. Zachery Dakins, M.D.  HISTORY:  The patient is a 60 year old patient referred from [facility] for a ganglion on the right wrist, volar aspect that has been aspirated on one occasion, but then recurred promptly.  The ganglion measures a little over 2 cm in size.  I cannot feel the radial artery and she occasionally gets some tingling in the median nerve distribution, but it is not __________.  I recommended that we excise this with Bier block anesthesia and she was in agreement.  DESCRIPTION OF PROCEDURE:  Preoperatively, she was positioned on the OR table. An IV had already been started in the hand plus the other arm for sedation and the tourniquet was inflated after the arm had been expressed and the Xylocaine introduced by the anesthesiologist.  I then prepped the hand with Betadine surgical scrub and solution, and draped in a sterile manner.  A radial incision right over the ganglion was made.  Sharp dissection identifying a lift in the subcutaneous tissue off of the ganglion, and then making an incision long enough that I had about 1 cm proximal and distal that I could work in.  I then freed up and went right down to the ganglion.  The little volar retinaculum was divided, probably about an 1/2 inch distally, and the pedicle of the ganglion  went down there and to the radial carpal area.  Right about this time, when I nearly had the area completely excised, the contents did start coming forth, but fortunately I was freeing up the more proximal area at that time, and I could free it up completely.  The radial artery was immediately under the slightly volar to the ganglion location, and it was protected throughout the dissection.  The ganglion was completely removed.  A few little superficial small veins were ligated with 4-0 Vicryl and then I injected about 4 cc of Marcaine in the skin and subcutaneous tissue and then released the tourniquet and took it completely off the proximal arm. Tourniquet time was about 40 minutes and there was no evidence of any arterial bleeding and a little bit of venous bleeding that was held compressed of about three to four minutes.  Then, I closed the subcutaneous tissue with interrupted 4-0 Vicryl and then closed the skin with interrupted 4-0 nylon and antibiotic ointment was applied.  I then put 4 x 4s and a Kerlix, and then took an ABD that had been kind of placed, shaped like a tube, and put it on the volar aspect to make the wrist stiff for the next 24 hours.  The patient was instructed to keep her arm elevated for the next couple of days.  I will see her in the office on Friday for wound  check and we will take the stitches out approximately a week later.  She had a radial artery pulse at the completion of surgery and the dressing was dry. Dictated by:   Anselm Pancoast. Zachery Dakins, M.D. Attending Physician:  Henrene Dodge DD:  04/23/01 TD:  04/24/01 Job: [id number]

## 2010-09-16 ENCOUNTER — Ambulatory Visit: Payer: Self-pay | Attending: Family Medicine

## 2010-09-16 DIAGNOSIS — M6281 Muscle weakness (generalized): Secondary | ICD-10-CM | POA: Insufficient documentation

## 2010-09-16 DIAGNOSIS — R293 Abnormal posture: Secondary | ICD-10-CM | POA: Insufficient documentation

## 2010-09-16 DIAGNOSIS — M25539 Pain in unspecified wrist: Secondary | ICD-10-CM | POA: Insufficient documentation

## 2010-09-16 DIAGNOSIS — IMO0001 Reserved for inherently not codable concepts without codable children: Secondary | ICD-10-CM | POA: Insufficient documentation

## 2010-09-16 DIAGNOSIS — M25519 Pain in unspecified shoulder: Secondary | ICD-10-CM | POA: Insufficient documentation

## 2010-09-21 ENCOUNTER — Ambulatory Visit: Payer: Self-pay | Admitting: Physical Therapy

## 2010-09-22 ENCOUNTER — Ambulatory Visit (HOSPITAL_COMMUNITY)
Admission: RE | Admit: 2010-09-22 | Discharge: 2010-09-22 | Disposition: A | Discharge status: Home / Self Care | Payer: Self-pay | Source: Ambulatory Visit | Attending: Interventional Cardiology | Admitting: Interventional Cardiology

## 2010-09-22 DIAGNOSIS — R072 Precordial pain: Secondary | ICD-10-CM | POA: Insufficient documentation

## 2010-09-22 DIAGNOSIS — I10 Essential (primary) hypertension: Secondary | ICD-10-CM | POA: Insufficient documentation

## 2010-09-28 ENCOUNTER — Ambulatory Visit: Payer: Self-pay

## 2010-09-29 ENCOUNTER — Encounter: Payer: Self-pay | Admitting: Physical Therapy

## 2010-09-30 LAB — COMPREHENSIVE METABOLIC PANEL
Albumin: 4
Alkaline Phosphatase: 140 — ABNORMAL HIGH
BUN: 14
CO2: 29
Calcium: 9.7
Chloride: 105
Potassium: 4

## 2010-09-30 LAB — CBC
HCT: 45.9
MCHC: 32.9
MCV: 86.1
Platelets: 326
WBC: 6.6

## 2010-09-30 LAB — DIFFERENTIAL
Basophils Absolute: 0.1
Eosinophils Absolute: 0.2
Eosinophils Relative: 2
Lymphocytes Relative: 40

## 2010-10-07 ENCOUNTER — Ambulatory Visit: Payer: Self-pay | Attending: Family Medicine

## 2010-10-07 DIAGNOSIS — R293 Abnormal posture: Secondary | ICD-10-CM | POA: Insufficient documentation

## 2010-10-07 DIAGNOSIS — M25539 Pain in unspecified wrist: Secondary | ICD-10-CM | POA: Insufficient documentation

## 2010-10-07 DIAGNOSIS — M6281 Muscle weakness (generalized): Secondary | ICD-10-CM | POA: Insufficient documentation

## 2010-10-07 DIAGNOSIS — IMO0001 Reserved for inherently not codable concepts without codable children: Secondary | ICD-10-CM | POA: Insufficient documentation

## 2010-10-07 DIAGNOSIS — M25519 Pain in unspecified shoulder: Secondary | ICD-10-CM | POA: Insufficient documentation

## 2010-10-13 LAB — POCT URINALYSIS DIP (DEVICE)
Hgb urine dipstick: NEGATIVE
Nitrite: NEGATIVE
Operator id: 23701
Specific Gravity, Urine: >=1.03

## 2010-10-20 ENCOUNTER — Ambulatory Visit: Payer: Self-pay | Admitting: Physical Therapy

## 2010-10-21 ENCOUNTER — Ambulatory Visit: Payer: Self-pay

## 2011-02-01 ENCOUNTER — Encounter (HOSPITAL_COMMUNITY): Payer: Self-pay | Admitting: Emergency Medicine

## 2011-02-01 ENCOUNTER — Emergency Department (INDEPENDENT_AMBULATORY_CARE_PROVIDER_SITE_OTHER)
Admission: EM | Admit: 2011-02-01 | Discharge: 2011-02-01 | Disposition: A | Discharge status: Home / Self Care | Payer: Self-pay | Source: Home / Self Care | Attending: Family Medicine | Admitting: Family Medicine

## 2011-02-01 DIAGNOSIS — J209 Acute bronchitis, unspecified: Secondary | ICD-10-CM

## 2011-02-01 HISTORY — DX: Essential (primary) hypertension: I10

## 2011-02-01 MED ORDER — SULFAMETHOXAZOLE-TRIMETHOPRIM 800-160 MG PO TABS: 1.0000 | ORAL_TABLET | Freq: Two times a day (BID) | ORAL | Status: AC

## 2011-02-01 MED ORDER — NYSTATIN 100000 UNIT/ML MT SUSP: 500000.0000 [IU] | Freq: Four times a day (QID) | OROMUCOSAL | Status: AC

## 2011-02-01 NOTE — ED Provider Notes (Signed)
History     CSN: [id number]  Arrival date & time 02/01/11  1248   First MD Initiated Contact with Patient 02/01/11 1325      Chief Complaint  Patient presents with  . Cough    (Consider location/radiation/quality/duration/timing/severity/associated sxs/prior treatment) Patient is a 61 y.o. female presenting with cough. The history is provided by the patient.  Cough This is a new problem. The current episode started more than 1 week ago. The problem has been gradually worsening. The cough is productive of sputum. There has been no fever. Associated symptoms include rhinorrhea. Pertinent negatives include no wheezing. The treatment provided no relief. She is not a smoker. Her past medical history is significant for bronchitis.    Past Medical History  Diagnosis Date  . Hypertension   . Asthma     Past Surgical History  Procedure Date  . Abdominal hysterectomy   . Breast surgery     No family history on file.  History  Substance Use Topics  . Smoking status: Never Smoker   . Smokeless tobacco: Not on file  . Alcohol Use: No    OB History    Grav Para Term Preterm Abortions TAB SAB Ect Mult Living                  Review of Systems  Constitutional: Negative.   HENT: Positive for congestion, rhinorrhea, mouth sores and postnasal drip.   Respiratory: Positive for cough. Negative for wheezing.   Cardiovascular: Negative.   Gastrointestinal: Negative.   Skin: Negative.     Allergies  Moxifloxacin and Penicillins  Home Medications   Current Outpatient Rx  Name Route Sig Dispense Refill  . VENTOLIN HFA IN Inhalation Inhale into the lungs.    . AMLODIPINE BESYLATE 10 MG PO TABS Oral Take 10 mg by mouth daily.    Marland Kitchen FLUTICASONE-SALMETEROL 500-50 MCG/DOSE IN AEPB Inhalation Inhale 1 puff into the lungs every 12 (twelve) hours.    Marland Kitchen HYDROCHLOROTHIAZIDE 25 MG PO TABS Oral Take 25 mg by mouth daily.    Marland Kitchen LISINOPRIL PO Oral Take by mouth.    Marland Kitchen LORATADINE 10 MG PO  TABS Oral Take 10 mg by mouth daily.    . MOMETASONE FUROATE 50 MCG/ACT NA SUSP Nasal Place 2 sprays into the nose daily.    Marland Kitchen MONTELUKAST SODIUM 10 MG PO TABS Oral Take 10 mg by mouth at bedtime.    . NYSTATIN 100000 UNIT/ML MT SUSP Oral Take 5 mLs (500,000 Units total) by mouth 4 (four) times daily. 120 mL 0  . SULFAMETHOXAZOLE-TRIMETHOPRIM 800-160 MG PO TABS Oral Take 1 tablet by mouth 2 (two) times daily. 20 tablet 0    BP 134/76  Pulse 89  Temp(Src) 98.9 F (37.2 C) (Oral)  Resp 18  SpO2 99%  Physical Exam  Nursing note and vitals reviewed. Constitutional: She is oriented to person, place, and time. She appears well-developed and well-nourished.  HENT:  Head: Normocephalic.  Right Ear: External ear normal.  Left Ear: External ear normal.  Nose: Nose normal.  Mouth/Throat: Oropharynx is clear and moist.  Eyes: Pupils are equal, round, and reactive to light.  Neck: Normal range of motion. Neck supple.  Cardiovascular: Normal rate, regular rhythm, normal heart sounds and intact distal pulses.   Pulmonary/Chest: Effort normal and breath sounds normal.  Abdominal: Soft. Bowel sounds are normal.  Lymphadenopathy:    She has no cervical adenopathy.  Neurological: She is alert and oriented to person, place, and time.  Skin: Skin is warm and dry.  Psychiatric: She has a normal mood and affect.    ED Course  Procedures (including critical care time)  Labs Reviewed - No data to display No results found.   1. Bronchitis, acute       MDM          Barkley Bruns, MD 02/01/11 1354

## 2011-02-01 NOTE — ED Notes (Addendum)
Headache, coughing, sore throat, coughing up phlegm.  Onset last week.

## 2011-02-01 NOTE — ED Notes (Signed)
Patient coughing up phlegm, reports phlegm is greenish per patient.

## 2011-06-15 ENCOUNTER — Encounter (HOSPITAL_COMMUNITY): Payer: Self-pay | Admitting: *Deleted

## 2011-06-15 ENCOUNTER — Emergency Department (INDEPENDENT_AMBULATORY_CARE_PROVIDER_SITE_OTHER)
Admission: EM | Admit: 2011-06-15 | Discharge: 2011-06-15 | Disposition: A | Discharge status: Home / Self Care | Payer: Self-pay | Source: Home / Self Care | Attending: Emergency Medicine | Admitting: Emergency Medicine

## 2011-06-15 DIAGNOSIS — L309 Dermatitis, unspecified: Secondary | ICD-10-CM

## 2011-06-15 DIAGNOSIS — L259 Unspecified contact dermatitis, unspecified cause: Secondary | ICD-10-CM

## 2011-06-15 MED ORDER — GRISEOFULVIN MICROSIZE 500 MG PO TABS: 500.0000 mg | ORAL_TABLET | Freq: Every day | ORAL | Status: AC

## 2011-06-15 NOTE — Discharge Instructions (Signed)
Rash A rash is a change in the color or texture of your skin. There are many different types of rashes. You may have other problems that accompany your rash. CAUSES   Infections.   Allergic reactions. This can include allergies to pets or foods.   Certain medicines.   Exposure to certain chemicals, soaps, or cosmetics.   Heat.   Exposure to poisonous plants.   Tumors, both cancerous and noncancerous.  SYMPTOMS   Redness.   Scaly skin.   Itchy skin.   Dry or cracked skin.   Bumps.   Blisters.   Pain.  DIAGNOSIS  Your caregiver may do a physical exam to determine what type of rash you have. A skin sample (biopsy) may be taken and examined under a microscope. TREATMENT  Treatment depends on the type of rash you have. Your caregiver may prescribe certain medicines. For serious conditions, you may need to see a skin doctor (dermatologist). HOME CARE INSTRUCTIONS   Avoid the substance that caused your rash.   Do not scratch your rash. This can cause infection.   You may take cool baths to help stop itching.   Only take over-the-counter or prescription medicines as directed by your caregiver.   Keep all follow-up appointments as directed by your caregiver.  SEEK IMMEDIATE MEDICAL CARE IF:  You have increasing pain, swelling, or redness.   You have a fever.   You have new or severe symptoms.   You have body aches, diarrhea, or vomiting.   Your rash is not better after 3 days.  MAKE SURE YOU:  Understand these instructions.   Will watch your condition.   Will get help right away if you are not doing well or get worse.  Document Released: 12/10/2001 Document Revised: 12/09/2010 Document Reviewed: 10/04/2010 [employer] Patient Information 2012 [employer]. 

## 2011-06-15 NOTE — ED Provider Notes (Signed)
Chief Complaint  Patient presents with  . Recurrent Skin Infections    History of Present Illness:   The patient is a 61 year old female who presents today with a 4 week history of a rash that began on her chest and spread to her legs and her head. It's intensely itchy his brace at nighttime. She's been exposed to 2 great grandchildren who have a similar rash and has been diagnosed as being due to ringworm. She's come in she's got ringworm and wants to be treated for that.  Review of Systems:  Other than noted above, the patient denies any of the following symptoms: Systemic:  No fever, chills, sweats, weight loss, or fatigue. ENT:  No nasal congestion, rhinorrhea, sore throat, swelling of lips, tongue or throat. Resp:  No cough, wheezing, or shortness of breath. Skin:  No rash, itching, nodules, or suspicious lesions.  PMFSH:  Past medical history, family history, social history, meds, and allergies were reviewed.  Physical Exam:   Vital signs:  BP 153/79  Pulse 64  Temp 98.4 F (36.9 C) (Oral)  Resp 16  SpO2 98% Gen:  Alert, oriented, in no distress. ENT:  Pharynx clear, no intraoral lesions, moist mucous membranes. Lungs:  Clear to auscultation. Skin:  She has some scattered, scaly papules on her chest, abdomen, and legs. They're not in her arms, none of the wrist or in the web spaces between her fingers. There is nothing that looks like tenia corporis.  Assessment:  The encounter diagnosis was Dermatitis. I told her I thought this did not look like ringworm, but she was adamant that she wanted treatment for ringworm. I suggested it could be due to scabies but she vehemently denied it and stated she would not use any medication for scabies. I reluctantly gave her a prescription for griseofulvin, although I told her I did not think this would work. I gave her the option of returning in a month if no better.  Plan:   1.  The following meds were prescribed:   New Prescriptions   GRISEOFULVIN (GRIFULVIN V) 500 MG TABLET    Take 1 tablet (500 mg total) by mouth daily.   2.  The patient was instructed in symptomatic care and handouts were given. 3.  The patient was told to return if becoming worse in any way, if no better in 3 or 4 days, and given some red flag symptoms that would indicate earlier return.     Reuben Likes, MD 06/15/11 1754

## 2011-06-15 NOTE — ED Notes (Signed)
Pt  Reports  A  Rash    On  Chest  And  abd      X  1  Month  After she  Thinks  She  Was  Exposed  To     Ringworm   From  A   Grandchild   She  Reports  The  Rash  Itches          -  She  Is  In no   Severe  Distress   Displays  No  Angioedema        No  Shortness  Of  Breath        Skin is  Warm  And  Dry

## 2011-07-29 ENCOUNTER — Emergency Department (INDEPENDENT_AMBULATORY_CARE_PROVIDER_SITE_OTHER)
Admission: EM | Admit: 2011-07-29 | Discharge: 2011-07-29 | Disposition: A | Discharge status: Home / Self Care | Payer: Self-pay | Source: Home / Self Care

## 2011-07-29 ENCOUNTER — Encounter (HOSPITAL_COMMUNITY): Payer: Self-pay | Admitting: Emergency Medicine

## 2011-07-29 DIAGNOSIS — B370 Candidal stomatitis: Secondary | ICD-10-CM

## 2011-07-29 DIAGNOSIS — J329 Chronic sinusitis, unspecified: Secondary | ICD-10-CM

## 2011-07-29 MED ORDER — NYSTATIN 100000 UNIT/ML MT SUSP: 500000.0000 [IU] | Freq: Four times a day (QID) | OROMUCOSAL | Status: AC

## 2011-07-29 MED ORDER — DOXYCYCLINE HYCLATE 100 MG PO CAPS: 100.0000 mg | ORAL_CAPSULE | Freq: Two times a day (BID) | ORAL | Status: AC

## 2011-07-29 NOTE — ED Notes (Signed)
Pt here with c/o sinus/uri sx that started x 2 weeks ago with tension h/a then progressed with yellow sputum with cough,chest congestion,sore throat and slight l ear pain.pt has been taking otc zyrtec,lotradine and allegra but no relief.hx asthma.sats 100% r/a

## 2011-08-02 NOTE — ED Provider Notes (Signed)
History     CSN: [id number]  Arrival date & time 07/29/11  1338   None     Chief Complaint  Patient presents with  . Sinusitis  . URI    (Consider location/radiation/quality/duration/timing/severity/associated sxs/prior treatment) HPI Comments: PT sick for 2 weeks, started as a cold, tried allergy medicines but they didn't help.  Now has maxillary and frontal facial pain, yellow drainage from nose and cough. Also requests rx for thrush medicine- uses advair and although rinses mouth, feels like is getting yeast infection in mouth  Patient is a 61 y.o. female presenting with sinusitis. The history is provided by the patient.  Sinusitis  This is a new problem. Episode onset: 2 weeks ago. The problem has been gradually worsening. There has been no fever. The pain is at a severity of 5/10. The pain is moderate. The pain has been constant since onset. Associated symptoms include congestion, sinus pressure and cough. Pertinent negatives include no chills, no sore throat and no shortness of breath. Treatments tried: allergy medicines. The treatment provided no relief.    Past Medical History  Diagnosis Date  . Hypertension   . Asthma     Past Surgical History  Procedure Date  . Abdominal hysterectomy   . Breast surgery     History reviewed. No pertinent family history.  History  Substance Use Topics  . Smoking status: Never Smoker   . Smokeless tobacco: Not on file  . Alcohol Use: No    OB History    Grav Para Term Preterm Abortions TAB SAB Ect Mult Living                  Review of Systems  Constitutional: Negative for fever and chills.  HENT: Positive for congestion, postnasal drip and sinus pressure. Negative for sore throat and rhinorrhea.   Respiratory: Positive for cough. Negative for shortness of breath.   Neurological: Negative for headaches.    Allergies  Moxifloxacin and Penicillins  Home Medications   Current Outpatient Rx  Name Route Sig Dispense  Refill  . VENTOLIN HFA IN Inhalation Inhale into the lungs.    . AMLODIPINE BESYLATE 10 MG PO TABS Oral Take 10 mg by mouth daily.    Marland Kitchen DOXYCYCLINE HYCLATE 100 MG PO CAPS Oral Take 1 capsule (100 mg total) by mouth 2 (two) times daily. 20 capsule 0  . FLUTICASONE-SALMETEROL 500-50 MCG/DOSE IN AEPB Inhalation Inhale 1 puff into the lungs every 12 (twelve) hours.    Marland Kitchen HYDROCHLOROTHIAZIDE 25 MG PO TABS Oral Take 25 mg by mouth daily.    Marland Kitchen LISINOPRIL PO Oral Take by mouth.    Marland Kitchen LORATADINE 10 MG PO TABS Oral Take 10 mg by mouth daily.    . MOMETASONE FUROATE 50 MCG/ACT NA SUSP Nasal Place 2 sprays into the nose daily.    Marland Kitchen MONTELUKAST SODIUM 10 MG PO TABS Oral Take 10 mg by mouth at bedtime.    . NYSTATIN 100000 UNIT/ML MT SUSP Oral Take 5 mLs (500,000 Units total) by mouth 4 (four) times daily. swish the medicine in your mouth for as long as possible 200 mL 0    BP 143/67  Pulse 66  Temp 98 F (36.7 C) (Oral)  Resp 16  SpO2 100%  Physical Exam  Constitutional: She appears well-developed and well-nourished. No distress.  HENT:  Right Ear: Tympanic membrane, external ear and ear canal normal.  Left Ear: Tympanic membrane, external ear and ear canal normal.  Nose: No mucosal  edema or rhinorrhea. Right sinus exhibits maxillary sinus tenderness and frontal sinus tenderness. Left sinus exhibits maxillary sinus tenderness and frontal sinus tenderness.  Mouth/Throat: Oropharynx is clear and moist.       White patches on oral mucosa c/w thrush  Cardiovascular: Normal rate and regular rhythm.   Pulmonary/Chest: Effort normal and breath sounds normal.  Lymphadenopathy:       Head (right side): No submental, no submandibular, no tonsillar, no preauricular and no posterior auricular adenopathy present.       Head (left side): No submental, no tonsillar, no preauricular and no posterior auricular adenopathy present.    She has no cervical adenopathy.    ED Course  Procedures (including critical  care time)  Labs Reviewed - No data to display No results found.   1. Sinusitis   2. Thrush       MDM          Cathlyn Parsons, NP 08/02/11 1436

## 2011-08-04 NOTE — ED Provider Notes (Signed)
Medical screening examination/treatment/procedure(s) were performed by non-physician practitioner and as supervising physician I was immediately available for consultation/collaboration.  Leslee Home, M.D.   Reuben Likes, MD 08/04/11 2021

## 2012-08-17 DIAGNOSIS — J4540 Moderate persistent asthma, uncomplicated: Secondary | ICD-10-CM | POA: Insufficient documentation

## 2012-09-27 ENCOUNTER — Other Ambulatory Visit (HOSPITAL_COMMUNITY): Payer: Self-pay | Admitting: Nurse Practitioner

## 2012-09-27 ENCOUNTER — Ambulatory Visit (HOSPITAL_COMMUNITY)
Admission: RE | Admit: 2012-09-27 | Discharge: 2012-09-27 | Disposition: A | Discharge status: Home / Self Care | Payer: Self-pay | Source: Ambulatory Visit | Attending: Nurse Practitioner | Admitting: Nurse Practitioner

## 2012-09-27 DIAGNOSIS — M1710 Unilateral primary osteoarthritis, unspecified knee: Secondary | ICD-10-CM | POA: Insufficient documentation

## 2012-09-27 DIAGNOSIS — IMO0002 Reserved for concepts with insufficient information to code with codable children: Secondary | ICD-10-CM | POA: Insufficient documentation

## 2012-09-27 DIAGNOSIS — R52 Pain, unspecified: Secondary | ICD-10-CM

## 2012-10-15 DIAGNOSIS — E119 Type 2 diabetes mellitus without complications: Secondary | ICD-10-CM | POA: Insufficient documentation

## 2013-01-25 ENCOUNTER — Encounter (HOSPITAL_COMMUNITY): Payer: Self-pay | Admitting: Emergency Medicine

## 2013-01-25 ENCOUNTER — Emergency Department (HOSPITAL_COMMUNITY)
Admission: EM | Admit: 2013-01-25 | Discharge: 2013-01-25 | Disposition: A | Discharge status: Home / Self Care | Payer: No Typology Code available for payment source | Attending: Emergency Medicine | Admitting: Emergency Medicine

## 2013-01-25 ENCOUNTER — Other Ambulatory Visit: Payer: Self-pay

## 2013-01-25 DIAGNOSIS — I10 Essential (primary) hypertension: Secondary | ICD-10-CM | POA: Insufficient documentation

## 2013-01-25 DIAGNOSIS — R5381 Other malaise: Secondary | ICD-10-CM | POA: Insufficient documentation

## 2013-01-25 DIAGNOSIS — Z79899 Other long term (current) drug therapy: Secondary | ICD-10-CM | POA: Insufficient documentation

## 2013-01-25 DIAGNOSIS — I498 Other specified cardiac arrhythmias: Secondary | ICD-10-CM

## 2013-01-25 DIAGNOSIS — J45901 Unspecified asthma with (acute) exacerbation: Secondary | ICD-10-CM | POA: Insufficient documentation

## 2013-01-25 DIAGNOSIS — I441 Atrioventricular block, second degree: Secondary | ICD-10-CM | POA: Insufficient documentation

## 2013-01-25 DIAGNOSIS — IMO0002 Reserved for concepts with insufficient information to code with codable children: Secondary | ICD-10-CM | POA: Insufficient documentation

## 2013-01-25 DIAGNOSIS — R5383 Other fatigue: Secondary | ICD-10-CM

## 2013-01-25 DIAGNOSIS — Z880 Allergy status to penicillin: Secondary | ICD-10-CM | POA: Insufficient documentation

## 2013-01-25 LAB — BASIC METABOLIC PANEL
BUN: 31 mg/dL — ABNORMAL HIGH (ref 6–23)
CHLORIDE: 102 meq/L (ref 96–112)
CO2: 26 meq/L (ref 19–32)
CREATININE: 1.32 mg/dL — AB (ref 0.50–1.10)
Calcium: 9.6 mg/dL (ref 8.4–10.5)
GFR calc Af Amer: 48 mL/min — ABNORMAL LOW (ref >90)
GFR calc non Af Amer: 42 mL/min — ABNORMAL LOW (ref >90)
GLUCOSE: 101 mg/dL — AB (ref 70–99)
POTASSIUM: 3.8 meq/L (ref 3.7–5.3)
Sodium: 143 mEq/L (ref 137–147)

## 2013-01-25 LAB — CBC
HCT: 44.4 % (ref 36.0–46.0)
HEMOGLOBIN: 14.9 g/dL (ref 12.0–15.0)
MCH: 28.2 pg (ref 26.0–34.0)
MCHC: 33.6 g/dL (ref 30.0–36.0)
MCV: 84.1 fL (ref 78.0–100.0)
Platelets: 360 10*3/uL (ref 150–400)
RBC: 5.28 MIL/uL — ABNORMAL HIGH (ref 3.87–5.11)
RDW: 14.1 % (ref 11.5–15.5)
WBC: 7.8 10*3/uL (ref 4.0–10.5)

## 2013-01-25 LAB — POCT I-STAT TROPONIN I: TROPONIN I, POC: 0.01 ng/mL (ref 0.00–0.08)

## 2013-01-25 NOTE — ED Notes (Signed)
Pt reports that she was called today by her PCP and told to come in for a K of 7.5 Pt reports that she has felt weak over the past couple of days. Reports some mild chest pain at triage.

## 2013-01-25 NOTE — ED Notes (Signed)
Dr. Brackbill at bedside 

## 2013-01-25 NOTE — Progress Notes (Signed)
I was asked to see this patient in the emergency room.  She had been seen yesterday at her PCPs office.  Blood work was drawn.  The blood work which was drawn apparently had a spuriously high potassium and the patient has been told to come to the emergency room.  When she arrived at the emergency room her potassium was repeated and was normal.  However her EKG showed an irregular pulse for which we were asked to see her.  Her EKG shows normal sinus rhythm with second degree AV block.  It appears to be predominantly Mobitz 1 block.  Ventricular rate is 46.  The patient has not been experiencing any dizzy spells or syncope.  He has not had any chest pain or shortness of breath.  Her physical examination is unremarkable except for her bradycardia.  Review of her medications reveals that she has been taking a large quantity of generic Flexeril.  Flexeril (cyclobenzaprine) can cause cardiac conduction disturbance and arrhythmia.  The patient is anxious to be allowed to go home if possible.  I believe that this is reasonable and I have instructed her carefully not to take any more cyclobenzaprine.  I would like to see her in my office for followup office visit and EKG with myself or NP/PA next week.  Okay for discharge from the emergency room tonight.

## 2013-01-25 NOTE — Discharge Instructions (Signed)
Followup with cardiology on Monday as scheduled by Dr. Mare Ferrari.  Return to the emergency department if you develop chest pain, difficulty breathing, passing out spells, or other new or concerning symptoms.   Second Degree Atrioventricular Block Second degree atrioventricular block is a type of heart block. The heartbeat is a coordinated contraction between the upper and lower chambers of the heart. This coordinated contraction happens because of an electrical impulse that is sent from the upper chambers of the heart to the lower chambers of the heart. The electrical impulse causes the heart to beat and pump blood. Normally, this electrical impulse is transmitted without delay. In a second degree heart block, an interruption occurs in the heart's electrical impulse between the upper and lower chambers of the heart. When this happens, the heart does not beat in a timely manner, which affects the amount of blood pumped by the heart. There are two types of 2nd degree heart block:   Mobitz Type 1. In this type of 2nd degree heart block, the electrical impulse is gradually delayed more and more until the heart misses a beat. This type of 2nd degree heart block is less serious than Mobitz type 2.  Mobitz Type 2. This type of 2nd degree heart block is more serious and can become a more severe form of heart block. With Mobitz type 2, some of the electrical signals are blocked and do not reach the lower chambers of the heart. This can occur suddenly and without warning. Some people may need a permanent pacemaker with this type of heart block. CAUSES  Second degree heart block may be a result of:  Age. The heart's electrical system can degenerate due to the aging process.  Heart attack. A heart attack can cause scarring which can damage the heart's electrical system.  Open heart surgery can damage and scar areas of the heart which affect the heart' s electrical system.  Heart medications such as beta and  calcium channel blockers. These kinds of medications can affect the electrical impulse of the heart and can slow the heart rate if the dosage is too high. SYMPTOMS   Mobitz type 1 - Usually, no symptoms are noticed, but a person may have the same symptoms listed under Mobitz type 2.  Mobitz type 2 - Compared to Mobitz type 1, there is a greater likelihood of experiencing the following symptoms:  Fatigue.  Shortness of breath.  Dizziness or lightheadedness.  Fainting.  Chest pain. DIAGNOSIS   Electrocardiogram (EKG). An EKG is a tracing of the heartbeat and can show a 2nd degree heart block.  Holter monitor. A holter monitor is a continuous heart rhythm recording for 24 hours. This can be helpful in determining the kind of heart block you have and how it can be treated.  Electrophysiology (EP) study. This is a procedure that tests the electrical pathway of your heart. This type of test is done by a specialist who places long thin tubes (catheters) in your heart. The catheters are used to study your heart and record your heart's electrical signals. TREATMENT   Mobitz Type 1. Generally, no treatment is needed.  Mobitz Type 2. A permanent pacemaker may be needed.  Heart medications such as beta blockers or calcium channel blockers can slow the heart rate. Your caregiver may need to adjust your heart medication if this is the cause of your heart block. Adjusting your heart medication may reverse the heart block. SEEK MEDICAL CARE IF:   You have unexplained fatigue.  You feel lightheaded.  You feel faint.  You feel your heart skipping beats or your heart beats very fast. SEEK IMMEDIATE MEDICAL CARE IF:   You have severe chest pain, especially if the pain is crushing or pressure-like and spreads to the arms, back, neck, or jaw. This is an emergency. Do not wait to see if the pain will go away. Get medical help at once. Call your local emergency services (911 in the [place] Do not  drive yourself to the hospital.  You notice increasing shortness of breath during rest, sleeping, or with activity.  You "black out" or faint. MAKE SURE YOU:   Understand these instructions.  Will watch your condition.  Will get help right away if you are not doing well or get worse. Document Released: 12/03/2007 Document Revised: 03/14/2011 Document Reviewed: 12/03/2007 [facility] Patient Information 2014 [place].

## 2013-01-25 NOTE — ED Provider Notes (Signed)
CSN: [id number]     Arrival date & time 01/25/13  1541 History   First MD Initiated Contact with Patient 01/25/13 1608     Chief Complaint  Patient presents with  . Abnormal Lab   (Consider location/radiation/quality/duration/timing/severity/associated sxs/prior Treatment) HPI Comments: Patient is a 63 year old female with history of hypertension and asthma. She was sent here at the request of her primary Dr. to do an abnormal lab result. She states that she has felt very tired recently and was seen for this. After she left the office, she was called and informed that she had a high potassium and that she needed to come to the hospital. She denies she is having any chest pain.   Patient is a 63 y.o. female presenting with weakness. The history is provided by the patient.  Weakness This is a new problem. Episode onset: One week ago. The problem occurs constantly. The problem has been gradually worsening. Associated symptoms include shortness of breath. Pertinent negatives include no chest pain. Nothing aggravates the symptoms. Nothing relieves the symptoms. She has tried nothing for the symptoms. The treatment provided no relief.    Past Medical History  Diagnosis Date  . Hypertension   . Asthma    Past Surgical History  Procedure Laterality Date  . Abdominal hysterectomy    . Breast surgery     History reviewed. No pertinent family history. History  Substance Use Topics  . Smoking status: Never Smoker   . Smokeless tobacco: Not on file  . Alcohol Use: No   OB History   Grav Para Term Preterm Abortions TAB SAB Ect Mult Living                 Review of Systems  Respiratory: Positive for shortness of breath.   Cardiovascular: Negative for chest pain.  Neurological: Positive for weakness.  All other systems reviewed and are negative.    Allergies  Moxifloxacin and Penicillins  Home Medications   Current Outpatient Rx  Name  Route  Sig  Dispense  Refill  . Albuterol  Sulfate (VENTOLIN HFA IN)   Inhalation   Inhale into the lungs.         Marland Kitchen amLODipine (NORVASC) 10 MG tablet   Oral   Take 10 mg by mouth daily.         . Fluticasone-Salmeterol (ADVAIR) 500-50 MCG/DOSE AEPB   Inhalation   Inhale 1 puff into the lungs every 12 (twelve) hours.         . hydrochlorothiazide (HYDRODIURIL) 25 MG tablet   Oral   Take 25 mg by mouth daily.         Marland Kitchen LISINOPRIL PO   Oral   Take by mouth.         . loratadine (CLARITIN) 10 MG tablet   Oral   Take 10 mg by mouth daily.         . mometasone (NASONEX) 50 MCG/ACT nasal spray   Nasal   Place 2 sprays into the nose daily.         . montelukast (SINGULAIR) 10 MG tablet   Oral   Take 10 mg by mouth at bedtime.          There were no vitals taken for this visit. Physical Exam  Nursing note and vitals reviewed. Constitutional: She is oriented to person, place, and time. She appears well-developed and well-nourished. No distress.  HENT:  Head: Normocephalic and atraumatic.  Mouth/Throat: Oropharynx is clear and moist.  Neck: Normal range of motion. Neck supple.  Cardiovascular: Normal rate and regular rhythm.  Exam reveals no gallop and no friction rub.   No murmur heard. Pulmonary/Chest: Effort normal and breath sounds normal. No respiratory distress. She has no wheezes.  Abdominal: Soft. Bowel sounds are normal. She exhibits no distension. There is no tenderness.  Musculoskeletal: Normal range of motion. She exhibits no edema.  Neurological: She is alert and oriented to person, place, and time. No cranial nerve deficit. She exhibits normal muscle tone. Coordination normal.  Skin: Skin is warm and dry. She is not diaphoretic.    ED Course  Procedures (including critical care time) Labs Review Labs Reviewed  CBC  BASIC METABOLIC PANEL   Imaging Review No results found.  EKG Interpretation    Date/Time:  Friday January 25 2013 15:55:11 EST Ventricular Rate:  46 PR  Interval:  126 QRS Duration: 122 QT Interval:  458 QTC Calculation: 400 R Axis:   -37 Text Interpretation:  AV Block Sinus rhythm with 2nd degree A-V block (Mobitz II) with 2:1 A-V conduction Left axis deviation Right bundle branch block Moderate voltage criteria for LVH, may be normal variant Abnormal ECG Confirmed by DELOS  MD, Rivaan Kendall ([id number]) on 01/25/2013 4:19:32 PM            MDM  No diagnosis found. Patient is a 63 year old female who was sent here by her primary Dr. for evaluation of elevated potassium. This was drawn today because she presented there with complaints of weakness. She was told to come here for further evaluation. Her repeat potassium is 3.8 and I suspect that the prior result was a hemolyzed specimen. EKG did reveal a second-degree heart block with a rate of 45. I've discussed this with Dr. Mare Ferrari who will evaluate the patient in the emergency department.  The patient was evaluated by Dr. Mare Ferrari and he has determined that her heart block is likely the result of excessive Flexeril use. He feels as though she is stable for discharge to home. He has made arrangements for her to followup in the clinic on Monday for reevaluation. She understands to return if her symptoms worsen or change.    Veryl Speak, MD 01/25/13 2350

## 2013-01-25 NOTE — ED Notes (Signed)
MD at bedside. 

## 2013-01-29 ENCOUNTER — Encounter: Payer: Self-pay | Admitting: *Deleted

## 2013-01-31 ENCOUNTER — Encounter: Payer: Self-pay | Admitting: Cardiology

## 2013-01-31 ENCOUNTER — Encounter: Payer: Self-pay | Admitting: *Deleted

## 2013-01-31 ENCOUNTER — Ambulatory Visit (HOSPITAL_COMMUNITY): Payer: No Typology Code available for payment source | Attending: Cardiology | Admitting: Radiology

## 2013-01-31 ENCOUNTER — Other Ambulatory Visit: Payer: Self-pay

## 2013-01-31 ENCOUNTER — Ambulatory Visit (INDEPENDENT_AMBULATORY_CARE_PROVIDER_SITE_OTHER): Payer: No Typology Code available for payment source | Admitting: Cardiology

## 2013-01-31 ENCOUNTER — Other Ambulatory Visit: Payer: Self-pay | Admitting: *Deleted

## 2013-01-31 VITALS — BP 120/90 | HR 36 | Ht 64.5 in | Wt 205.8 lb

## 2013-01-31 DIAGNOSIS — I10 Essential (primary) hypertension: Secondary | ICD-10-CM

## 2013-01-31 DIAGNOSIS — I442 Atrioventricular block, complete: Secondary | ICD-10-CM

## 2013-01-31 DIAGNOSIS — E669 Obesity, unspecified: Secondary | ICD-10-CM | POA: Insufficient documentation

## 2013-01-31 DIAGNOSIS — F1411 Cocaine abuse, in remission: Secondary | ICD-10-CM | POA: Insufficient documentation

## 2013-01-31 DIAGNOSIS — I509 Heart failure, unspecified: Secondary | ICD-10-CM | POA: Insufficient documentation

## 2013-01-31 LAB — CBC WITH DIFFERENTIAL/PLATELET
Basophils Absolute: 0 10*3/uL (ref 0.0–0.1)
Basophils Relative: 0.5 % (ref 0.0–3.0)
Eosinophils Absolute: 0.1 10*3/uL (ref 0.0–0.7)
Eosinophils Relative: 1.5 % (ref 0.0–5.0)
HEMATOCRIT: 44.7 % (ref 36.0–46.0)
Hemoglobin: 14.3 g/dL (ref 12.0–15.0)
LYMPHS ABS: 3.8 10*3/uL (ref 0.7–4.0)
LYMPHS PCT: 51.6 % — AB (ref 12.0–46.0)
MCHC: 32 g/dL (ref 30.0–36.0)
MCV: 87 fl (ref 78.0–100.0)
MONOS PCT: 8.7 % (ref 3.0–12.0)
Monocytes Absolute: 0.6 10*3/uL (ref 0.1–1.0)
Neutro Abs: 2.8 10*3/uL (ref 1.4–7.7)
Neutrophils Relative %: 37.7 % — ABNORMAL LOW (ref 43.0–77.0)
PLATELETS: 318 10*3/uL (ref 150.0–400.0)
RBC: 5.14 Mil/uL — ABNORMAL HIGH (ref 3.87–5.11)
RDW: 14.3 % (ref 11.5–14.6)
WBC: 7.4 10*3/uL (ref 4.5–10.5)

## 2013-01-31 LAB — BRAIN NATRIURETIC PEPTIDE: PRO B NATRI PEPTIDE: 133 pg/mL — AB (ref 0.0–100.0)

## 2013-01-31 LAB — BASIC METABOLIC PANEL
BUN: 17 mg/dL (ref 6–23)
CO2: 27 meq/L (ref 19–32)
Calcium: 9.1 mg/dL (ref 8.4–10.5)
Chloride: 102 mEq/L (ref 96–112)
Creatinine, Ser: 1 mg/dL (ref 0.4–1.2)
GFR: 72.62 mL/min (ref >60.00)
Glucose, Bld: 129 mg/dL — ABNORMAL HIGH (ref 70–99)
Potassium: 3.2 mEq/L — ABNORMAL LOW (ref 3.5–5.1)
Sodium: 137 mEq/L (ref 135–145)

## 2013-01-31 LAB — TSH: TSH: 0.9 u[IU]/mL (ref 0.35–5.50)

## 2013-01-31 MED ORDER — POTASSIUM CHLORIDE CRYS ER 20 MEQ PO TBCR: 20.0000 meq | EXTENDED_RELEASE_TABLET | Freq: Every day | ORAL | Status: DC

## 2013-01-31 NOTE — Patient Instructions (Addendum)
Your physician has requested that you have an echocardiogram. Echocardiography is a painless test that uses sound waves to create images of your heart. It provides your doctor with information about the size and shape of your heart and how well your heart's chambers and valves are working. This procedure takes approximately one hour. There are no restrictions for this procedure. TODAY  Your physician recommends that you return for lab work today--BMET/BNP/TSH/CBCd  Claiborne Billings, Dr Hillery Jacks nurse will talk with you today about scheduling you to have a pacemaker.

## 2013-01-31 NOTE — Progress Notes (Signed)
Patient ID: Jessica Carpenter, female   DOB: 09-Feb-1950, 63 y.o.   MRN: [id number] 63 yo with history of HTN and diabetes presents to the office today for followup of bradycardia.  Patient says that she has been feeling "weak" for about 3 months now.  She tires easily with walking.  She can walk 100-200 feet then has to stop.  She tires with hills or stairs.  No lightheadedness, no syncope.  No chest pain. She gets occasional GERD-type symptoms at night when lying in bed after dinner.   She saw her PCP earlier this month and labs showed high potassium.  It turns out that this was likely hemolysis.  However, patient was sent to the ER for a recheck.  The K was normal, but patient was noted to be bradycardic. ECG showed 2:1 AV block with RBBB.  ECG was reviewed with cardiology and the patient was discharged.  She was told not to take anymore cyclobenzaprine.  She was given a followup in the office for today.  Today, patient continues to report fatigue with exertion.  HR today is 36 with complete heart block and a wide LBBB-type escape rhythm.  Blood pressure is preserved and patient has had no dizziness/lightheadedness or syncope.    ECG (1/23): sinus rhythm with 2:1 AV block and RBBB ECG (1/29): sinus rhythm with complete heart block and LBBB escape, HR 36  Labs (1/15): K 3.8, creatinine 1.32, HCT 44.4  PMH: 1. HTN 2. Type II diabetes 3. Asthma 4. Stress echo (9/12) was normal 5. Heart block: 2:1 heart block on ECG in 1/15, followup ECG with complete heart block.   SH: Nonsmoker, lives in [place], no ETOH.   FH: Sister and mother both with "heart disease."  Mother has had CVA.   ROS: All systems reviewed and negative except as per HPI.    Current Outpatient Prescriptions  Medication Sig Dispense Refill  . albuterol (PROVENTIL HFA;VENTOLIN HFA) 108 (90 BASE) MCG/ACT inhaler Inhale 1-2 puffs into the lungs every 4 (four) hours as needed for wheezing or shortness of breath.      Marland Kitchen amLODipine  (NORVASC) 10 MG tablet Take 10 mg by mouth daily.      Marland Kitchen aspirin EC 81 MG tablet Take 81 mg by mouth daily.      . cyclobenzaprine (FLEXERIL) 10 MG tablet Take 10 mg by mouth at bedtime as needed for muscle spasms.      . fluticasone (FLONASE) 50 MCG/ACT nasal spray Place 1 spray into both nostrils daily.      . Fluticasone-Salmeterol (ADVAIR) 250-50 MCG/DOSE AEPB Inhale 1 puff into the lungs 2 (two) times daily.      . hydrochlorothiazide (HYDRODIURIL) 25 MG tablet Take 12.5 mg by mouth daily.       Marland Kitchen loratadine (CLARITIN) 10 MG tablet Take 10 mg by mouth daily.      Marland Kitchen losartan (COZAAR) 25 MG tablet Take 25 mg by mouth daily.      . meloxicam (MOBIC) 7.5 MG tablet Take 7.5 mg by mouth daily.      . montelukast (SINGULAIR) 10 MG tablet Take 10 mg by mouth at bedtime.      Marland Kitchen nystatin (MYCOSTATIN) 100000 UNIT/ML suspension Take 4 mLs by mouth 4 (four) times daily.       No current facility-administered medications for this visit.    BP 120/90  Pulse 36  Ht 5' 4.5" (1.638 m)  Wt 93.35 kg (205 lb 12.8 oz)  BMI 34.79 kg/m2  General: NAD Neck: JVP 8-9 cm, no thyromegaly or thyroid nodule.  Lungs: Clear to auscultation bilaterally with normal respiratory effort. CV: Nondisplaced PMI.  Heart bradycardic,  regular S1/S2, no S3/S4, no murmur.  1+ ankle edema.  No carotid bruit.  Normal pedal pulses.  Abdomen: Soft, nontender, no hepatosplenomegaly, no distention.  Skin: Intact without lesions or rashes.  Neurologic: Alert and oriented x 3.  Psych: Normal affect. Extremities: No clubbing or cyanosis.  HEENT: Normal.   Assessment/Plan: 1. Bradycardia: Patient appears to have progressed from 2:1 AV block on 1/23 to complete heart block today with LBBB escape and HR 36. She is fatigued with exertion but has had no lightheadedness or syncope and blood pressure is preserved.   - She was seen today by Dr. Lovena Le.  He is going to set her up for pacemaker placement ASAP (his nurse will arrange). If she  develops lightheadedness or worsening symptoms in the interim, she was instructed to go immediately to the emergency room.  - No nodal blockers - Will get echo to assess EF.  If EF < 50%, will have LV lead placed.  - Check TSH today.  - Reasonable to assess for coronary disease with CHB.  If her EF is normal by echo, will plan on getting a stress Cardiolite after her PCM is placed.  2. CHF: Patient is mildly volume overloaded on exam.  Suspect she may have a mild degree of CHF due to bradycardia.  As above, she will be getting an echo today to look at her LV function. - Hold off on Lasix for now until she has PCM, will see how she is doing after that.  - Will check BNP today.  3. HTN: Stable blood pressure.    Loralie Champagne 01/31/2013 5:00 PM

## 2013-01-31 NOTE — Progress Notes (Signed)
Echocardiogram performed.  

## 2013-02-01 ENCOUNTER — Other Ambulatory Visit: Payer: Self-pay | Admitting: *Deleted

## 2013-02-01 DIAGNOSIS — I442 Atrioventricular block, complete: Secondary | ICD-10-CM

## 2013-02-03 DIAGNOSIS — J45909 Unspecified asthma, uncomplicated: Secondary | ICD-10-CM | POA: Diagnosis not present

## 2013-02-03 DIAGNOSIS — I10 Essential (primary) hypertension: Secondary | ICD-10-CM | POA: Diagnosis not present

## 2013-02-03 DIAGNOSIS — Z7982 Long term (current) use of aspirin: Secondary | ICD-10-CM | POA: Diagnosis not present

## 2013-02-03 DIAGNOSIS — E119 Type 2 diabetes mellitus without complications: Secondary | ICD-10-CM | POA: Diagnosis not present

## 2013-02-03 DIAGNOSIS — I498 Other specified cardiac arrhythmias: Secondary | ICD-10-CM | POA: Diagnosis present

## 2013-02-03 DIAGNOSIS — I442 Atrioventricular block, complete: Secondary | ICD-10-CM | POA: Diagnosis not present

## 2013-02-03 DIAGNOSIS — I509 Heart failure, unspecified: Secondary | ICD-10-CM | POA: Diagnosis not present

## 2013-02-03 MED ORDER — VANCOMYCIN HCL IN DEXTROSE 1-5 GM/200ML-% IV SOLN
1000.0000 mg | INTRAVENOUS | Status: DC
  Filled 2013-02-03: qty 200

## 2013-02-03 MED ORDER — SODIUM CHLORIDE 0.9 % IR SOLN
80.0000 mg | Status: DC
  Filled 2013-02-03: qty 2

## 2013-02-04 ENCOUNTER — Encounter (HOSPITAL_COMMUNITY)
Admission: RE | Disposition: A | Discharge status: Home / Self Care | Payer: Self-pay | Source: Ambulatory Visit | Attending: Internal Medicine

## 2013-02-04 ENCOUNTER — Encounter (HOSPITAL_COMMUNITY): Payer: Self-pay | Admitting: General Practice

## 2013-02-04 ENCOUNTER — Ambulatory Visit (HOSPITAL_COMMUNITY)
Admission: RE | Admit: 2013-02-04 | Discharge: 2013-02-05 | Disposition: A | Discharge status: Home / Self Care | Payer: Medicaid Other | Source: Ambulatory Visit | Attending: Internal Medicine | Admitting: Internal Medicine

## 2013-02-04 DIAGNOSIS — E119 Type 2 diabetes mellitus without complications: Secondary | ICD-10-CM | POA: Insufficient documentation

## 2013-02-04 DIAGNOSIS — J45909 Unspecified asthma, uncomplicated: Secondary | ICD-10-CM | POA: Insufficient documentation

## 2013-02-04 DIAGNOSIS — I498 Other specified cardiac arrhythmias: Secondary | ICD-10-CM | POA: Insufficient documentation

## 2013-02-04 DIAGNOSIS — I442 Atrioventricular block, complete: Secondary | ICD-10-CM | POA: Diagnosis not present

## 2013-02-04 DIAGNOSIS — Z7982 Long term (current) use of aspirin: Secondary | ICD-10-CM | POA: Insufficient documentation

## 2013-02-04 DIAGNOSIS — I10 Essential (primary) hypertension: Secondary | ICD-10-CM | POA: Insufficient documentation

## 2013-02-04 DIAGNOSIS — I509 Heart failure, unspecified: Secondary | ICD-10-CM | POA: Insufficient documentation

## 2013-02-04 HISTORY — DX: Type 2 diabetes mellitus without complications: E11.9

## 2013-02-04 HISTORY — PX: PERMANENT PACEMAKER INSERTION: SHX5480

## 2013-02-04 HISTORY — DX: Gastro-esophageal reflux disease without esophagitis: K21.9

## 2013-02-04 HISTORY — DX: Unspecified chronic bronchitis: J42

## 2013-02-04 HISTORY — DX: Unspecified osteoarthritis, unspecified site: M19.90

## 2013-02-04 HISTORY — DX: Presence of cardiac pacemaker: Z95.0

## 2013-02-04 HISTORY — DX: Other specified postprocedural states: Z98.890

## 2013-02-04 HISTORY — PX: PACEMAKER INSERTION: SHX728

## 2013-02-04 HISTORY — DX: Other specified postprocedural states: R11.2

## 2013-02-04 HISTORY — DX: Bradycardia, unspecified: R00.1

## 2013-02-04 LAB — POTASSIUM: Potassium: 4 mEq/L (ref 3.7–5.3)

## 2013-02-04 LAB — GLUCOSE, CAPILLARY
Glucose-Capillary: 87 mg/dL (ref 70–99)
Glucose-Capillary: 116 mg/dL — ABNORMAL HIGH (ref 70–99)

## 2013-02-04 LAB — SURGICAL PCR SCREEN
MRSA, PCR: NEGATIVE
Staphylococcus aureus: NEGATIVE

## 2013-02-04 SURGERY — PERMANENT PACEMAKER INSERTION
Anesthesia: LOCAL

## 2013-02-04 MED ORDER — POTASSIUM CHLORIDE CRYS ER 20 MEQ PO TBCR
20.0000 meq | EXTENDED_RELEASE_TABLET | Freq: Every day | ORAL | Status: DC
  Administered 2013-02-04: 21:00:00 20 meq via ORAL
  Filled 2013-02-04 (×2): qty 1

## 2013-02-04 MED ORDER — MUPIROCIN 2 % EX OINT
TOPICAL_OINTMENT | CUTANEOUS | Status: AC
  Administered 2013-02-04: 1 via NASAL
  Filled 2013-02-04: qty 22

## 2013-02-04 MED ORDER — VANCOMYCIN HCL 10 G IV SOLR
1500.0000 mg | Freq: Two times a day (BID) | INTRAVENOUS | Status: AC
  Administered 2013-02-05: 04:00:00 1500 mg via INTRAVENOUS
  Filled 2013-02-04: qty 1500

## 2013-02-04 MED ORDER — LORATADINE 10 MG PO TABS
10.0000 mg | ORAL_TABLET | Freq: Every day | ORAL | Status: DC
  Filled 2013-02-04: qty 1

## 2013-02-04 MED ORDER — NYSTATIN 100000 UNIT/ML MT SUSP
4.0000 mL | Freq: Four times a day (QID) | OROMUCOSAL | Status: DC
  Administered 2013-02-04: 4000000 [IU] via ORAL
  Filled 2013-02-04 (×6): qty 5

## 2013-02-04 MED ORDER — ACETAMINOPHEN 325 MG PO TABS
325.0000 mg | ORAL_TABLET | ORAL | Status: DC | PRN
  Administered 2013-02-04 – 2013-02-05 (×2): 650 mg via ORAL
  Filled 2013-02-04 (×2): qty 2

## 2013-02-04 MED ORDER — ALBUTEROL SULFATE (2.5 MG/3ML) 0.083% IN NEBU: 3.0000 mL | INHALATION_SOLUTION | RESPIRATORY_TRACT | Status: DC | PRN

## 2013-02-04 MED ORDER — AMLODIPINE BESYLATE 10 MG PO TABS
10.0000 mg | ORAL_TABLET | Freq: Every day | ORAL | Status: DC
  Administered 2013-02-04: 10 mg via ORAL
  Filled 2013-02-04 (×3): qty 1

## 2013-02-04 MED ORDER — MELOXICAM 7.5 MG PO TABS
7.5000 mg | ORAL_TABLET | Freq: Every day | ORAL | Status: DC
  Administered 2013-02-04: 19:00:00 7.5 mg via ORAL
  Filled 2013-02-04 (×2): qty 1

## 2013-02-04 MED ORDER — LOSARTAN POTASSIUM 25 MG PO TABS
25.0000 mg | ORAL_TABLET | Freq: Every day | ORAL | Status: DC
  Administered 2013-02-04: 25 mg via ORAL
  Filled 2013-02-04 (×2): qty 1

## 2013-02-04 MED ORDER — FLUTICASONE PROPIONATE 50 MCG/ACT NA SUSP
1.0000 | Freq: Every day | NASAL | Status: DC
  Administered 2013-02-04: 1 via NASAL
  Filled 2013-02-04: qty 16

## 2013-02-04 MED ORDER — MONTELUKAST SODIUM 10 MG PO TABS
10.0000 mg | ORAL_TABLET | Freq: Every day | ORAL | Status: DC
  Administered 2013-02-04: 10 mg via ORAL
  Filled 2013-02-04 (×2): qty 1

## 2013-02-04 MED ORDER — SODIUM CHLORIDE 0.9 % IV SOLN
INTRAVENOUS | Status: DC
  Administered 2013-02-04: 11:00:00 via INTRAVENOUS

## 2013-02-04 MED ORDER — MOMETASONE FURO-FORMOTEROL FUM 100-5 MCG/ACT IN AERO
2.0000 | INHALATION_SPRAY | Freq: Two times a day (BID) | RESPIRATORY_TRACT | Status: DC
  Administered 2013-02-04 – 2013-02-05 (×2): 2 via RESPIRATORY_TRACT
  Filled 2013-02-04: qty 8.8

## 2013-02-04 MED ORDER — CHLORHEXIDINE GLUCONATE 4 % EX LIQD: 60.0000 mL | Freq: Once | CUTANEOUS | Status: DC

## 2013-02-04 MED ORDER — ASPIRIN EC 81 MG PO TBEC
81.0000 mg | DELAYED_RELEASE_TABLET | Freq: Every day | ORAL | Status: DC
  Administered 2013-02-04: 81 mg via ORAL
  Filled 2013-02-04 (×2): qty 1

## 2013-02-04 MED ORDER — HYDROCHLOROTHIAZIDE 12.5 MG PO CAPS
12.5000 mg | ORAL_CAPSULE | Freq: Every day | ORAL | Status: DC
  Filled 2013-02-04: qty 1

## 2013-02-04 MED ORDER — MIDAZOLAM HCL 5 MG/5ML IJ SOLN
INTRAMUSCULAR | Status: AC
Start: 2013-02-04 — End: 2013-02-04
  Filled 2013-02-04: qty 5

## 2013-02-04 MED ORDER — MUPIROCIN 2 % EX OINT
TOPICAL_OINTMENT | Freq: Two times a day (BID) | CUTANEOUS | Status: DC
  Administered 2013-02-04: 1 via NASAL

## 2013-02-04 MED ORDER — CYCLOBENZAPRINE HCL 10 MG PO TABS
10.0000 mg | ORAL_TABLET | Freq: Every evening | ORAL | Status: DC | PRN
  Administered 2013-02-04: 10 mg via ORAL
  Filled 2013-02-04: qty 1

## 2013-02-04 MED ORDER — FENTANYL CITRATE 0.05 MG/ML IJ SOLN
INTRAMUSCULAR | Status: AC
  Filled 2013-02-04: qty 2

## 2013-02-04 MED ORDER — HYDROCHLOROTHIAZIDE 25 MG PO TABS
12.5000 mg | ORAL_TABLET | Freq: Every day | ORAL | Status: DC
  Administered 2013-02-04: 12.5 mg via ORAL
  Filled 2013-02-04: qty 0.5

## 2013-02-04 MED ORDER — HEPARIN (PORCINE) IN NACL 2-0.9 UNIT/ML-% IJ SOLN
INTRAMUSCULAR | Status: AC
Start: 2013-02-04 — End: 2013-02-04
  Filled 2013-02-04: qty 500

## 2013-02-04 MED ORDER — ONDANSETRON HCL 4 MG/2ML IJ SOLN: 4.0000 mg | Freq: Four times a day (QID) | INTRAMUSCULAR | Status: DC | PRN

## 2013-02-04 MED ORDER — LIDOCAINE HCL (PF) 1 % IJ SOLN
INTRAMUSCULAR | Status: AC
  Filled 2013-02-04: qty 60

## 2013-02-04 MED ORDER — YOU HAVE A PACEMAKER BOOK
Freq: Once | Status: AC
  Administered 2013-02-04: 21:00:00
  Filled 2013-02-04: qty 1

## 2013-02-04 MED ORDER — LIVING WELL WITH DIABETES BOOK
Freq: Once | Status: AC
  Administered 2013-02-04: 21:00:00
  Filled 2013-02-04: qty 1

## 2013-02-04 NOTE — Discharge Summary (Signed)
ELECTROPHYSIOLOGY PROCEDURE DISCHARGE SUMMARY    Patient ID: Jessica Carpenter,  MRN: [id number], DOB/AGE: 1950/08/19 63 y.o.  Admit date: 02/04/2013 Discharge date: 02/05/2013  Primary Care Physician: Wendie Simmer, MD Primary Cardiologist: Loralie Champagne, MD  Primary Discharge Diagnosis:  Symptomatic bradycardia  Secondary Discharge Diagnosis:  1.  Hypertension 2.  Type II diabetes 3.  Asthma  Allergies  Allergen Reactions  . Moxifloxacin Shortness Of Breath and Other (See Comments)    Caused syncope and throat tightness  . Penicillins Hives, Shortness Of Breath and Other (See Comments)    Caused syncope and throat tightness     Procedures This Admission:  1.  Implantation of dual chamber pacemaker on 02-04-2013 by Dr Lovena Le.  The patient received a STJ Assurity pacemaker with model number 1688 right atrial and right ventricular leads.  There were no early apparent complications. 2.  CXR on 02-05-2013 demonstrated  Brief HPI: Jessica Carpenter is a 63 year old female with a past medical history as outlined above.  She has had progressive weakness and was found to have 2:1 AV block with no reversible causes.  She was evaluated by Dr Lovena Le who recommended pacemaker implantation.  Risks, benefits, and alternatives were reviewed with the patient who wished to proceed.    Hospital Course:  The patient was admitted and underwent implantation of a STJ dual chamber pacemaker with details as outlined above.   She was monitored on telemetry overnight which demonstrated atrial pacing with intrinsic ventricular conduction.  Left chest was without hematoma or ecchymosis.  The device was interrogated and found to be functioning normally.  CXR was obtained and demonstrated no pneumothorax status post device implantation.  Wound care, arm mobility, and restrictions were reviewed with the patient.  Dr Lovena Le examined the patient and considered them stable for discharge to home.    Discharge  Vitals: Blood pressure 131/58, pulse 60, temperature 97.7 F (36.5 C), temperature source Oral, resp. rate 17, height 5' 4.5" (1.638 m), weight 206 lb 2.1 oz (93.5 kg), SpO2 94.00%.    CV - rrr, Lungs - clear, incision - no hematoma  Labs:   Lab Results  Component Value Date   WBC 7.4 01/31/2013   HGB 14.3 01/31/2013   HCT 44.7 01/31/2013   MCV 87.0 01/31/2013   PLT 318.0 01/31/2013     Recent Labs Lab 01/31/13 1246 02/04/13 1025  NA 137  --   K 3.2* 4.0  CL 102  --   CO2 27  --   BUN 17  --   CREATININE 1.0  --   CALCIUM 9.1  --   GLUCOSE 129*  --     Discharge Medications:    Medication List    ASK your doctor about these medications       albuterol 108 (90 BASE) MCG/ACT inhaler  Commonly known as:  PROVENTIL HFA;VENTOLIN HFA  Inhale 1-2 puffs into the lungs every 4 (four) hours as needed for wheezing or shortness of breath.     amLODipine 10 MG tablet  Commonly known as:  NORVASC  Take 10 mg by mouth daily.     aspirin EC 81 MG tablet  Take 81 mg by mouth daily.     cyclobenzaprine 10 MG tablet  Commonly known as:  FLEXERIL  Take 10 mg by mouth at bedtime as needed for muscle spasms.     fluticasone 50 MCG/ACT nasal spray  Commonly known as:  FLONASE  Place 1 spray into both  nostrils daily.     Fluticasone-Salmeterol 250-50 MCG/DOSE Aepb  Commonly known as:  ADVAIR  Inhale 1 puff into the lungs 2 (two) times daily.     hydrochlorothiazide 25 MG tablet  Commonly known as:  HYDRODIURIL  Take 12.5 mg by mouth daily.     loratadine 10 MG tablet  Commonly known as:  CLARITIN  Take 10 mg by mouth daily.     losartan 25 MG tablet  Commonly known as:  COZAAR  Take 25 mg by mouth daily.     meloxicam 7.5 MG tablet  Commonly known as:  MOBIC  Take 7.5 mg by mouth daily.     montelukast 10 MG tablet  Commonly known as:  SINGULAIR  Take 10 mg by mouth at bedtime.     nystatin 100000 UNIT/ML suspension  Commonly known as:  MYCOSTATIN  Take 4 mLs by  mouth 4 (four) times daily.     potassium chloride SA 20 MEQ tablet  Commonly known as:  K-DUR,KLOR-CON  Take 1 tablet (20 mEq total) by mouth daily.        Disposition:   Future Appointments Provider Department Dept Phone   02/14/2013 3:30 PM [facility] [place] [facility] [phone]   05/07/2013 3:15 PM Evans Lance, MD [place] Office [phone]       Duration of Discharge Encounter: Greater than 30 minutes including physician time.  Signed,  Mikle Bosworth.D.

## 2013-02-04 NOTE — Interval H&P Note (Signed)
History and Physical Interval Note: patient seen and examined. Agree with Dr. Oleh Genin note. I have discussed the treatment options with the patient regarding the risks/benefits/goals/expectations of DDD PM and she wishes to proceed.   02/04/2013 3:20 PM  Jessica Carpenter  has presented today for surgery, with the diagnosis of Complete heart block  The various methods of treatment have been discussed with the patient and family. After consideration of risks, benefits and other options for treatment, the patient has consented to  Procedure(s): PERMANENT PACEMAKER INSERTION (N/A) as a surgical intervention .  The patient's history has been reviewed, patient examined, no change in status, stable for surgery.  I have reviewed the patient's chart and labs.  Questions were answered to the patient's satisfaction.     Mikle Bosworth.D.

## 2013-02-04 NOTE — CV Procedure (Signed)
SURGEON:  Cristopher Peru, MD     PREPROCEDURE DIAGNOSIS:  Symptomatic Bradycardia due to complete heart block    POSTPROCEDURE DIAGNOSIS:  Same as preprocedure diagnosis     PROCEDURES:    1. Pacemaker implantation.     INTRODUCTION: Jessica Carpenter is a 63 y.o. female  with a history of bradycardia due to complete heart block who presents today for pacemaker implantation.  The patient reports intermittent episodes of dizziness and sob over the past few weeks.  No reversible causes have been identified.  The patient therefore presents today for pacemaker implantation.     DESCRIPTION OF PROCEDURE:  Informed written consent was obtained, and   the patient was brought to the electrophysiology lab in a fasting state.  The patient was sedated with IV fentanyl and versed.  The patients left chest was prepped and draped in the usual sterile fashion by the EP lab staff. The skin overlying the left deltopectoral region was infiltrated with lidocaine for local analgesia.  A 5-cm incision was made over the left deltopectoral region.  A left subcutaneous pacemaker pocket was fashioned using a combination of sharp and blunt dissection. Electrocautery was required to assure hemostasis.     RA/RV Lead Placement: The left subclavian vein was punctured twice and to 7 french sheaths were placed.  Through the left sublcavian vein, a St. Jude Tendril model X233739, (serial number [id number]) right atrial lead and a St. Jude Tendril X233739 (serial number [id number]) right ventricular lead were advanced with fluoroscopic visualization into the right atrial appendage and right ventricular apical septal positions respectively.  Initial atrial lead P- waves measured 3.6 mV with impedance of 462 ohms and a threshold of 0.8 V at 0.5 msec.  Right ventricular lead R-waves measured 9.8 mV with an impedance of 714 ohms and a threshold of 0.8 V at 0.5 msec.  Both leads were secured to the pectoralis fascia using #2-0 silk over the suture  sleeves.   Device Placement:  The leads were then connected to a St. Jude Assurity DDD (serial number [id number]) pacemaker.  The pocket was irrigated with copious gentamicin solution.  The pacemaker was then placed into the pocket.  The pocket was then closed in 2 layers with 2.0 Vicryl suture for the subcutaneous and subcuticular layers.  Steri-Strips and a sterile dressing were then applied.  There were no early apparent complications.     CONCLUSIONS:   1. Successful implantation of a St. Jude dual-chamber pacemaker for symptomatic bradycardia due to intermittant complete heart block  2. No early apparent complications.           Cristopher Peru, MD 02/04/2013 4:43 PM

## 2013-02-04 NOTE — H&P (View-Only) (Signed)
Patient ID: Jessica Carpenter, female   DOB: 09-Feb-1950, 63 y.o.   MRN: [id number] 63 y.o. with history of HTN and diabetes presents to the office today for followup of bradycardia.  Patient says that she has been feeling "weak" for about 3 months now.  She tires easily with walking.  She can walk 100-200 feet then has to stop.  She tires with hills or stairs.  No lightheadedness, no syncope.  No chest pain. She gets occasional GERD-type symptoms at night when lying in bed after dinner.   She saw her PCP earlier this month and labs showed high potassium.  It turns out that this was likely hemolysis.  However, patient was sent to the ER for a recheck.  The K was normal, but patient was noted to be bradycardic. ECG showed 2:1 AV block with RBBB.  ECG was reviewed with cardiology and the patient was discharged.  She was told not to take anymore cyclobenzaprine.  She was given a followup in the office for today.  Today, patient continues to report fatigue with exertion.  HR today is 36 with complete heart block and a wide LBBB-type escape rhythm.  Blood pressure is preserved and patient has had no dizziness/lightheadedness or syncope.    ECG (1/23): sinus rhythm with 2:1 AV block and RBBB ECG (1/29): sinus rhythm with complete heart block and LBBB escape, HR 36  Labs (1/15): K 3.8, creatinine 1.32, HCT 44.4  PMH: 1. HTN 2. Type II diabetes 3. Asthma 4. Stress echo (9/12) was normal 5. Heart block: 2:1 heart block on ECG in 1/15, followup ECG with complete heart block.   SH: Nonsmoker, lives in [place], no ETOH.   FH: Sister and mother both with "heart disease."  Mother has had CVA.   ROS: All systems reviewed and negative except as per HPI.    Current Outpatient Prescriptions  Medication Sig Dispense Refill  . albuterol (PROVENTIL HFA;VENTOLIN HFA) 108 (90 BASE) MCG/ACT inhaler Inhale 1-2 puffs into the lungs every 4 (four) hours as needed for wheezing or shortness of breath.      Marland Kitchen amLODipine  (NORVASC) 10 MG tablet Take 10 mg by mouth daily.      Marland Kitchen aspirin EC 81 MG tablet Take 81 mg by mouth daily.      . cyclobenzaprine (FLEXERIL) 10 MG tablet Take 10 mg by mouth at bedtime as needed for muscle spasms.      . fluticasone (FLONASE) 50 MCG/ACT nasal spray Place 1 spray into both nostrils daily.      . Fluticasone-Salmeterol (ADVAIR) 250-50 MCG/DOSE AEPB Inhale 1 puff into the lungs 2 (two) times daily.      . hydrochlorothiazide (HYDRODIURIL) 25 MG tablet Take 12.5 mg by mouth daily.       Marland Kitchen loratadine (CLARITIN) 10 MG tablet Take 10 mg by mouth daily.      Marland Kitchen losartan (COZAAR) 25 MG tablet Take 25 mg by mouth daily.      . meloxicam (MOBIC) 7.5 MG tablet Take 7.5 mg by mouth daily.      . montelukast (SINGULAIR) 10 MG tablet Take 10 mg by mouth at bedtime.      Marland Kitchen nystatin (MYCOSTATIN) 100000 UNIT/ML suspension Take 4 mLs by mouth 4 (four) times daily.       No current facility-administered medications for this visit.    BP 120/90  Pulse 36  Ht 5' 4.5" (1.638 m)  Wt 93.35 kg (205 lb 12.8 oz)  BMI 34.79 kg/m2  General: NAD Neck: JVP 8-9 cm, no thyromegaly or thyroid nodule.  Lungs: Clear to auscultation bilaterally with normal respiratory effort. CV: Nondisplaced PMI.  Heart bradycardic,  regular S1/S2, no S3/S4, no murmur.  1+ ankle edema.  No carotid bruit.  Normal pedal pulses.  Abdomen: Soft, nontender, no hepatosplenomegaly, no distention.  Skin: Intact without lesions or rashes.  Neurologic: Alert and oriented x 3.  Psych: Normal affect. Extremities: No clubbing or cyanosis.  HEENT: Normal.   Assessment/Plan: 1. Bradycardia: Patient appears to have progressed from 2:1 AV block on 1/23 to complete heart block today with LBBB escape and HR 36. She is fatigued with exertion but has had no lightheadedness or syncope and blood pressure is preserved.   - She was seen today by Dr. Taylor.  He is going to set her up for pacemaker placement ASAP (his nurse will arrange). If she  develops lightheadedness or worsening symptoms in the interim, she was instructed to go immediately to the emergency room.  - No nodal blockers - Will get echo to assess EF.  If EF < 50%, will have LV lead placed.  - Check TSH today.  - Reasonable to assess for coronary disease with CHB.  If her EF is normal by echo, will plan on getting a stress Cardiolite after her PCM is placed.  2. CHF: Patient is mildly volume overloaded on exam.  Suspect she may have a mild degree of CHF due to bradycardia.  As above, she will be getting an echo today to look at her LV function. - Hold off on Lasix for now until she has PCM, will see how she is doing after that.  - Will check BNP today.  3. HTN: Stable blood pressure.    Jerauld Bostwick 01/31/2013 5:00 PM   

## 2013-02-05 ENCOUNTER — Ambulatory Visit (HOSPITAL_COMMUNITY): Payer: Medicaid Other

## 2013-02-05 DIAGNOSIS — I442 Atrioventricular block, complete: Secondary | ICD-10-CM | POA: Diagnosis not present

## 2013-02-05 DIAGNOSIS — E119 Type 2 diabetes mellitus without complications: Secondary | ICD-10-CM | POA: Diagnosis not present

## 2013-02-05 DIAGNOSIS — I498 Other specified cardiac arrhythmias: Secondary | ICD-10-CM | POA: Diagnosis not present

## 2013-02-05 DIAGNOSIS — I10 Essential (primary) hypertension: Secondary | ICD-10-CM | POA: Diagnosis not present

## 2013-02-05 LAB — GLUCOSE, CAPILLARY
Glucose-Capillary: 126 mg/dL — ABNORMAL HIGH (ref 70–99)
Glucose-Capillary: 151 mg/dL — ABNORMAL HIGH (ref 70–99)

## 2013-02-05 NOTE — Discharge Instructions (Signed)
° °  Supplemental Discharge Instructions for  Pacemaker/Defibrillator Patients  Activity No heavy lifting or vigorous activity with your left/right arm for 6 to 8 weeks.  Do not raise your left/right arm above your head for one week.  Gradually raise your affected arm as drawn below.           02/05                      02/06                       02/07                      02/08       NO DRIVING for 1 week; you may begin driving on 37/90/2409. WOUND CARE   Keep the wound area clean and dry.  Do not get this area wet for one week. No showers for one week; you may shower on 02/13/2013.   The tape/steri-strips on your wound will fall off; do not pull them off.  No bandage is needed on the site.  DO  NOT apply any creams, oils, or ointments to the wound area.   If you notice any drainage or discharge from the wound, any swelling or bruising at the site, or you develop a fever > 101? F after you are discharged home, call the office at once.  Special Instructions   You are still able to use cellular telephones; use the ear opposite the side where you have your pacemaker/defibrillator.  Avoid carrying your cellular phone near your device.   When traveling through airports, show security personnel your identification card to avoid being screened in the metal detectors.  Ask the security personnel to use the hand wand.   Avoid arc welding equipment, MRI testing (magnetic resonance imaging), TENS units (transcutaneous nerve stimulators).  Call the office for questions about other devices.   Avoid electrical appliances that are in poor condition or are not properly grounded.   Microwave ovens are safe to be near or to operate.

## 2013-02-08 ENCOUNTER — Encounter (HOSPITAL_COMMUNITY): Payer: Self-pay | Admitting: *Deleted

## 2013-02-14 ENCOUNTER — Ambulatory Visit (INDEPENDENT_AMBULATORY_CARE_PROVIDER_SITE_OTHER): Payer: No Typology Code available for payment source | Admitting: *Deleted

## 2013-02-14 DIAGNOSIS — I442 Atrioventricular block, complete: Secondary | ICD-10-CM

## 2013-02-14 LAB — MDC_IDC_ENUM_SESS_TYPE_INCLINIC
Battery Remaining Longevity: 97.2 mo
Battery Voltage: 3.08 V
Date Time Interrogation Session: 20150212154854
Implantable Pulse Generator Model: 2240
Implantable Pulse Generator Serial Number: 7588973
Lead Channel Impedance Value: 462.5 Ohm
Lead Channel Impedance Value: 487.5 Ohm
Lead Channel Pacing Threshold Amplitude: 0.5 V
Lead Channel Pacing Threshold Amplitude: 0.5 V
Lead Channel Pacing Threshold Amplitude: 0.75 V
Lead Channel Pacing Threshold Pulse Width: 0.4 ms
Lead Channel Pacing Threshold Pulse Width: 0.4 ms
Lead Channel Pacing Threshold Pulse Width: 0.4 ms
Lead Channel Sensing Intrinsic Amplitude: 6.7 mV
Lead Channel Setting Pacing Amplitude: 3.5 V
Lead Channel Setting Pacing Amplitude: 3.5 V
Lead Channel Setting Pacing Pulse Width: 0.4 ms
MDC IDC MSMT LEADCHNL RA PACING THRESHOLD AMPLITUDE: 0.75 V
MDC IDC MSMT LEADCHNL RA PACING THRESHOLD PULSEWIDTH: 0.4 ms
MDC IDC MSMT LEADCHNL RA SENSING INTR AMPL: 5 mV
MDC IDC SET LEADCHNL RV SENSING SENSITIVITY: 2 mV
MDC IDC STAT BRADY RA PERCENT PACED: 34 %
MDC IDC STAT BRADY RV PERCENT PACED: 37 %

## 2013-02-14 NOTE — Progress Notes (Signed)

## 2013-02-28 ENCOUNTER — Telehealth: Payer: Self-pay | Admitting: Physician Assistant

## 2013-02-28 NOTE — Telephone Encounter (Signed)
Jessica Carpenter is a 63 y.o. female is s/p recent pacemaker implant. She called today b/c her power just went out due to the recent snowfall.  Her remote transmitter is out.  I reassured her.  She can call [place] or our office once her power comes on to reboot the machine. She has no complaints.  Richardson Dopp, PA-C   02/28/2013 7:32 AM

## 2013-03-05 ENCOUNTER — Encounter: Payer: Self-pay | Admitting: Internal Medicine

## 2013-05-07 ENCOUNTER — Encounter: Payer: Self-pay | Admitting: Internal Medicine

## 2013-05-07 ENCOUNTER — Ambulatory Visit (INDEPENDENT_AMBULATORY_CARE_PROVIDER_SITE_OTHER): Payer: No Typology Code available for payment source | Admitting: Internal Medicine

## 2013-05-07 VITALS — BP 128/75 | HR 72 | Ht 64.5 in | Wt 206.0 lb

## 2013-05-07 DIAGNOSIS — I442 Atrioventricular block, complete: Secondary | ICD-10-CM

## 2013-05-07 DIAGNOSIS — I10 Essential (primary) hypertension: Secondary | ICD-10-CM

## 2013-05-07 DIAGNOSIS — Z950 Presence of cardiac pacemaker: Secondary | ICD-10-CM | POA: Insufficient documentation

## 2013-05-07 LAB — MDC_IDC_ENUM_SESS_TYPE_INCLINIC
Battery Remaining Longevity: 116.4 mo
Battery Voltage: 3.02 V
Brady Statistic RA Percent Paced: 32 %
Brady Statistic RV Percent Paced: 76 %
Implantable Pulse Generator Model: 2240
Implantable Pulse Generator Serial Number: 7588973
Lead Channel Impedance Value: 537.5 Ohm
Lead Channel Pacing Threshold Amplitude: 0.75 V
Lead Channel Pacing Threshold Amplitude: 0.75 V
Lead Channel Pacing Threshold Pulse Width: 0.4 ms
Lead Channel Pacing Threshold Pulse Width: 0.4 ms
Lead Channel Sensing Intrinsic Amplitude: 7.7 mV
Lead Channel Setting Pacing Pulse Width: 0.4 ms
MDC IDC MSMT LEADCHNL RA IMPEDANCE VALUE: 525 Ohm
MDC IDC MSMT LEADCHNL RA PACING THRESHOLD PULSEWIDTH: 0.4 ms
MDC IDC MSMT LEADCHNL RA SENSING INTR AMPL: 5 mV
MDC IDC MSMT LEADCHNL RV PACING THRESHOLD AMPLITUDE: 0.75 V
MDC IDC MSMT LEADCHNL RV PACING THRESHOLD AMPLITUDE: 0.75 V
MDC IDC MSMT LEADCHNL RV PACING THRESHOLD PULSEWIDTH: 0.4 ms
MDC IDC SESS DTM: 20150505172439
MDC IDC SET LEADCHNL RA PACING AMPLITUDE: 2 V
MDC IDC SET LEADCHNL RV PACING AMPLITUDE: 2.5 V
MDC IDC SET LEADCHNL RV SENSING SENSITIVITY: 2 mV

## 2013-05-07 NOTE — Patient Instructions (Addendum)
Remote monitoring is used to monitor your pacemaker from home. This monitoring reduces the number of office visits required to check your device to one time per year. It allows [place] to keep an eye on the functioning of your device to ensure it is working properly. You are scheduled for a device check from home on 08-08-2013. You may send your transmission at any time that day. If you have a wireless device, the transmission will be sent automatically. After your physician reviews your transmission, you will receive a postcard with your next transmission date.  Your physician recommends that you schedule a follow-up appointment in: Febuary 2016 with Dr.Gregg Lovena Le

## 2013-05-07 NOTE — Assessment & Plan Note (Signed)
Her St. Jude DDD PM is working normally. Will recheck in several months.  

## 2013-05-07 NOTE — Assessment & Plan Note (Signed)
Her blood pressure is well controlled. She will continue her current meds. I have asked the patient to lose weight and reduce her sodium intake.

## 2013-05-07 NOTE — Progress Notes (Signed)
HPI Jessica Carpenter returns today for followup. She is a pleasant 63 yo woman with a h/o HTN who developed CHB, and is s/p PPM insertion. In the interim, she has done well although she has recently been bothered by a cough and bronchitis. No fever or chills.  Allergies  Allergen Reactions  . Moxifloxacin Shortness Of Breath and Other (See Comments)    Caused syncope and throat tightness  . Penicillins Hives, Shortness Of Breath and Other (See Comments)    Caused syncope and throat tightness     Current Outpatient Prescriptions  Medication Sig Dispense Refill  . albuterol (PROVENTIL HFA;VENTOLIN HFA) 108 (90 BASE) MCG/ACT inhaler Inhale 1-2 puffs into the lungs every 4 (four) hours as needed for wheezing or shortness of breath.      Marland Kitchen amLODipine (NORVASC) 10 MG tablet Take 10 mg by mouth daily.      Marland Kitchen aspirin EC 81 MG tablet Take 81 mg by mouth daily.      . cyclobenzaprine (FLEXERIL) 10 MG tablet Take 10 mg by mouth at bedtime as needed for muscle spasms.      . fluticasone (FLONASE) 50 MCG/ACT nasal spray Place 1 spray into both nostrils daily.      . Fluticasone-Salmeterol (ADVAIR) 250-50 MCG/DOSE AEPB Inhale 1 puff into the lungs 2 (two) times daily.      . hydrochlorothiazide (HYDRODIURIL) 25 MG tablet Take 12.5 mg by mouth daily.       Marland Kitchen loratadine (CLARITIN) 10 MG tablet Take 10 mg by mouth daily.      Marland Kitchen losartan (COZAAR) 25 MG tablet Take 25 mg by mouth daily.      . meloxicam (MOBIC) 7.5 MG tablet Take 7.5 mg by mouth daily.      . montelukast (SINGULAIR) 10 MG tablet Take 10 mg by mouth at bedtime.      Marland Kitchen nystatin (MYCOSTATIN) 100000 UNIT/ML suspension Take 4 mLs by mouth 4 (four) times daily.      . potassium chloride SA (K-DUR,KLOR-CON) 20 MEQ tablet Take 20 mEq by mouth daily. Pt states she has been taking 2 tablets at bedtime (05/07/13)       No current facility-administered medications for this visit.     Past Medical History  Diagnosis Date  . Hypertension   .  Asthma   . Gout     "used to; haven't had it lately" (02/04/2013)  . PONV (postoperative nausea and vomiting)   . Chronic bronchitis     "usually get it q yr" (02/04/2013)  . Type II diabetes mellitus   . GERD (gastroesophageal reflux disease)   . Arthritis     "left leg" (02/04/2013)  . Symptomatic bradycardia     s/p STJ dual chamber pacemaker by Dr Lovena Le 02/2013    ROS:   All systems reviewed and negative except as noted in the HPI.   Past Surgical History  Procedure Laterality Date  . Ganglion cyst excision Right 2003    Ganglion of wrist, volar and distal radius  . Pacemaker insertion  02/04/2013    STJ Assurity dual chamber pacemaker implanted by Dr Lovena Le for symptomatic bradycardia  . Abdominal hysterectomy  ?1980's  . Dilation and curettage of uterus    . Tubal ligation    . Breast biopsy Right     "benign"  . Breast lumpectomy Right     "benign"     No family history on file.   History   Social History  .  Marital Status: Divorced    Spouse Name: N/A    Number of Children: N/A  . Years of Education: N/A   Occupational History  . Not on file.   Social History Main Topics  . Smoking status: Never Smoker   . Smokeless tobacco: Never Used  . Alcohol Use: Yes     Comment: 02/04/2013 "2 shots of liquor; maybe q 3 wks"  . Drug Use: No  . Sexual Activity: No   Other Topics Concern  . Not on file   Social History Narrative  . No narrative on file     BP 128/75  Pulse 72  Ht 5' 4.5" (1.638 m)  Wt 206 lb (93.441 kg)  BMI 34.83 kg/m2  Physical Exam:  Well appearing middle aged woman, NAD HEENT: Unremarkable Neck:  No JVD, no thyromegally Back:  No CVA tenderness Lungs:  Clear with no wheezes HEART:  Regular rate rhythm, no murmurs, no rubs, no clicks Abd:  soft, positive bowel sounds, no organomegally, no rebound, no guarding Ext:  2 plus pulses, no edema, no cyanosis, no clubbing Skin:  No rashes no nodules Neuro:  CN II through XII intact, motor  grossly intact   DEVICE  Normal device function.  See PaceArt for details.   Assess/Plan:

## 2013-07-04 ENCOUNTER — Ambulatory Visit (HOSPITAL_COMMUNITY)
Admission: RE | Admit: 2013-07-04 | Discharge: 2013-07-04 | Disposition: A | Discharge status: Home / Self Care | Payer: Medicaid Other | Source: Ambulatory Visit | Attending: Nurse Practitioner | Admitting: Nurse Practitioner

## 2013-07-04 ENCOUNTER — Other Ambulatory Visit (HOSPITAL_COMMUNITY): Payer: Self-pay | Admitting: Nurse Practitioner

## 2013-07-04 DIAGNOSIS — M79609 Pain in unspecified limb: Secondary | ICD-10-CM | POA: Diagnosis not present

## 2013-07-04 DIAGNOSIS — R52 Pain, unspecified: Secondary | ICD-10-CM

## 2013-08-07 ENCOUNTER — Other Ambulatory Visit: Payer: Self-pay | Admitting: Nurse Practitioner

## 2013-08-07 DIAGNOSIS — Z1231 Encounter for screening mammogram for malignant neoplasm of breast: Secondary | ICD-10-CM

## 2013-08-08 ENCOUNTER — Ambulatory Visit (INDEPENDENT_AMBULATORY_CARE_PROVIDER_SITE_OTHER): Payer: No Typology Code available for payment source | Admitting: *Deleted

## 2013-08-08 ENCOUNTER — Encounter: Payer: Self-pay | Admitting: Internal Medicine

## 2013-08-08 DIAGNOSIS — I442 Atrioventricular block, complete: Secondary | ICD-10-CM

## 2013-08-08 LAB — MDC_IDC_ENUM_SESS_TYPE_REMOTE
Battery Remaining Longevity: 109 mo
Brady Statistic AP VS Percent: 1 %
Brady Statistic AS VP Percent: 83 %
Brady Statistic AS VS Percent: 1 %
Brady Statistic RA Percent Paced: 16 %
Lead Channel Impedance Value: 490 Ohm
Lead Channel Pacing Threshold Amplitude: 0.75 V
Lead Channel Sensing Intrinsic Amplitude: 7.5 mV
Lead Channel Setting Pacing Amplitude: 2 V
Lead Channel Setting Pacing Amplitude: 2.5 V
Lead Channel Setting Pacing Pulse Width: 0.4 ms
Lead Channel Setting Sensing Sensitivity: 2 mV
MDC IDC MSMT BATTERY REMAINING PERCENTAGE: 95.5 %
MDC IDC MSMT BATTERY VOLTAGE: 3.02 V
MDC IDC MSMT LEADCHNL RA IMPEDANCE VALUE: 490 Ohm
MDC IDC MSMT LEADCHNL RA PACING THRESHOLD AMPLITUDE: 0.75 V
MDC IDC MSMT LEADCHNL RA PACING THRESHOLD PULSEWIDTH: 0.4 ms
MDC IDC MSMT LEADCHNL RA SENSING INTR AMPL: 5 mV
MDC IDC MSMT LEADCHNL RV PACING THRESHOLD PULSEWIDTH: 0.4 ms
MDC IDC PG SERIAL: 7588973
MDC IDC SESS DTM: 20150806074343
MDC IDC STAT BRADY AP VP PERCENT: 16 %
MDC IDC STAT BRADY RV PERCENT PACED: 99 %

## 2013-08-08 NOTE — Progress Notes (Signed)
Remote pacemaker transmission.   

## 2013-08-15 ENCOUNTER — Encounter (HOSPITAL_COMMUNITY): Payer: Self-pay | Admitting: Emergency Medicine

## 2013-08-15 ENCOUNTER — Emergency Department (HOSPITAL_COMMUNITY)
Admission: EM | Admit: 2013-08-15 | Discharge: 2013-08-15 | Disposition: A | Discharge status: Home / Self Care | Payer: PRIVATE HEALTH INSURANCE | Source: Home / Self Care | Attending: Emergency Medicine | Admitting: Emergency Medicine

## 2013-08-15 DIAGNOSIS — J40 Bronchitis, not specified as acute or chronic: Secondary | ICD-10-CM

## 2013-08-15 MED ORDER — AZITHROMYCIN 250 MG PO TABS: 250.0000 mg | ORAL_TABLET | Freq: Every day | ORAL | Status: DC

## 2013-08-15 MED ORDER — TRAMADOL HCL 50 MG PO TABS: 50.0000 mg | ORAL_TABLET | Freq: Two times a day (BID) | ORAL | Status: DC | PRN

## 2013-08-15 NOTE — Discharge Instructions (Signed)

## 2013-08-15 NOTE — ED Notes (Signed)
Patient c/o "bronchitis" and aching onset 2 weeks ago

## 2013-08-15 NOTE — ED Provider Notes (Signed)
CSN: [id number]     Arrival date & time 08/15/13  2409 History   First MD Initiated Contact with Patient 08/15/13 [phone]     Chief Complaint  Patient presents with  . Bronchitis   (Consider location/radiation/quality/duration/timing/severity/associated sxs/prior Treatment) HPI Comments: Non-smoker PCP: [facility] @ [facility]  Patient is a 63 y.o. female presenting with URI. The history is provided by the patient.  URI Presenting symptoms: congestion, cough, fatigue and rhinorrhea   Severity:  Moderate Onset quality:  Gradual Duration:  2 weeks Timing:  Constant Progression:  Unchanged Chronicity:  New Ineffective treatments:  Inhaler and OTC medications Associated symptoms: no arthralgias, no headaches, no myalgias, no neck pain, no sinus pain, no sneezing, no swollen glands and no wheezing   Associated symptoms comment:  +states back and shoulders are sore from coughing   Past Medical History  Diagnosis Date  . Hypertension   . Asthma   . Gout     "used to; haven't had it lately" (02/04/2013)  . PONV (postoperative nausea and vomiting)   . Chronic bronchitis     "usually get it q yr" (02/04/2013)  . Type II diabetes mellitus   . GERD (gastroesophageal reflux disease)   . Arthritis     "left leg" (02/04/2013)  . 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 Ganglion cyst excision Right 2003    Ganglion of wrist, volar and distal radius  . Pacemaker insertion  02/04/2013    STJ Assurity dual chamber pacemaker implanted by Dr Lovena Le for symptomatic bradycardia  . Abdominal hysterectomy  ?1980's  . Dilation and curettage of uterus    . Tubal ligation    . Breast biopsy Right     "benign"  . Breast lumpectomy Right     "benign"   History reviewed. No pertinent family history. History  Substance Use Topics  . Smoking status: Never Smoker   . Smokeless tobacco: Never Used  . Alcohol Use: Yes      Comment: 02/04/2013 "2 shots of liquor; maybe q 3 wks"   OB History   Grav Para Term Preterm Abortions TAB SAB Ect Mult Living                 Review of Systems  Constitutional: Positive for fatigue.  HENT: Positive for congestion and rhinorrhea. Negative for sneezing.   Respiratory: Positive for cough. Negative for wheezing.   Musculoskeletal: Negative for arthralgias, myalgias and neck pain.  Neurological: Negative for headaches.  All other systems reviewed and are negative.   Allergies  Moxifloxacin and Penicillins  Home Medications   Prior to Admission medications   Medication Sig Start Date End Date Taking? Authorizing Provider  METFORMIN HCL PO Take by mouth.   Yes Historical Provider, MD  albuterol (PROVENTIL HFA;VENTOLIN HFA) 108 (90 BASE) MCG/ACT inhaler Inhale 1-2 puffs into the lungs every 4 (four) hours as needed for wheezing or shortness of breath.    Historical Provider, MD  amLODipine (NORVASC) 10 MG tablet Take 10 mg by mouth daily.    Historical Provider, MD  aspirin EC 81 MG tablet Take 81 mg by mouth daily.    Historical Provider, MD  azithromycin (ZITHROMAX) 250 MG tablet Take 1 tablet (250 mg total) by mouth daily. Take first 2 tablets together, then 1 every day until finished. 08/15/13   Jessica Hives Knoxx Boeding, PA  cyclobenzaprine (FLEXERIL) 10 MG tablet  Take 10 mg by mouth at bedtime as needed for muscle spasms.    Historical Provider, MD  fluticasone (FLONASE) 50 MCG/ACT nasal spray Place 1 spray into both nostrils daily.    Historical Provider, MD  Fluticasone-Salmeterol (ADVAIR) 250-50 MCG/DOSE AEPB Inhale 1 puff into the lungs 2 (two) times daily.    Historical Provider, MD  hydrochlorothiazide (HYDRODIURIL) 25 MG tablet Take 12.5 mg by mouth daily.     Historical Provider, MD  loratadine (CLARITIN) 10 MG tablet Take 10 mg by mouth daily.    Historical Provider, MD  losartan (COZAAR) 25 MG tablet Take 25 mg by mouth daily.    Historical Provider, MD   meloxicam (MOBIC) 7.5 MG tablet Take 7.5 mg by mouth daily.    Historical Provider, MD  montelukast (SINGULAIR) 10 MG tablet Take 10 mg by mouth at bedtime.    Historical Provider, MD  nystatin (MYCOSTATIN) 100000 UNIT/ML suspension Take 4 mLs by mouth 4 (four) times daily.    Historical Provider, MD  potassium chloride SA (K-DUR,KLOR-CON) 20 MEQ tablet Take 20 mEq by mouth daily. Pt states she has been taking 2 tablets at bedtime (05/07/13) 01/31/13   Larey Dresser, MD  traMADol (ULTRAM) 50 MG tablet Take 1 tablet (50 mg total) by mouth every 12 (twelve) hours as needed for moderate pain (as for persistent cough). 08/15/13   Jessica Gula H Neamiah Sciarra, PA   BP 159/86  Pulse 72  Temp(Src) 98 F (36.7 C) (Oral)  Resp 20  SpO2 99% Physical Exam  Nursing note and vitals reviewed. Constitutional: She is oriented to person, place, and time. She appears well-developed and well-nourished.  HENT:  Head: Normocephalic and atraumatic.  Right Ear: External ear normal.  Left Ear: External ear normal.  Nose: Nose normal.  Mouth/Throat: Oropharynx is clear and moist.  Eyes: Conjunctivae are normal. No scleral icterus.  Cardiovascular: Normal rate, regular rhythm and normal heart sounds.   Pulmonary/Chest: Effort normal and breath sounds normal. No respiratory distress. She has no wheezes.  Abdominal: Soft. Bowel sounds are normal. She exhibits no distension. There is no tenderness.  Musculoskeletal: Normal range of motion.  Neurological: She is alert and oriented to person, place, and time.  Skin: Skin is warm and dry. No rash noted. No erythema.  Psychiatric: She has a normal mood and affect. Her behavior is normal.    ED Course  Procedures (including critical care time) Labs Review Labs Reviewed - No data to display  Imaging Review No results found.   MDM   1. Bronchitis    Given prolonged course of illness and underlying co-morbidities, will provide patient with 5 day course of  azithromycin for possible atypical pulmonary infection and tramadol for shoulder discomfort and cough suppression. Advise close follow up with PCP if no improvement.     Lutricia Feil, [place] 08/15/13 1019

## 2013-08-15 NOTE — ED Provider Notes (Signed)
Medical screening examination/treatment/procedure(s) were performed by resident physician or non-physician practitioner and as supervising physician I was immediately available for consultation/collaboration.  Maryruth Eve, MD     Melony Overly, MD 08/15/13 [phone]

## 2013-08-16 ENCOUNTER — Encounter: Payer: Self-pay | Admitting: Cardiology

## 2013-11-11 ENCOUNTER — Telehealth: Payer: Self-pay | Admitting: Cardiology

## 2013-11-11 ENCOUNTER — Ambulatory Visit (INDEPENDENT_AMBULATORY_CARE_PROVIDER_SITE_OTHER): Payer: PRIVATE HEALTH INSURANCE | Admitting: *Deleted

## 2013-11-11 DIAGNOSIS — I442 Atrioventricular block, complete: Secondary | ICD-10-CM

## 2013-11-11 LAB — MDC_IDC_ENUM_SESS_TYPE_REMOTE
Battery Remaining Percentage: 95.5 %
Battery Voltage: 3.02 V
Brady Statistic AP VP Percent: 16 %
Brady Statistic RA Percent Paced: 16 %
Date Time Interrogation Session: 20151109195805
Implantable Pulse Generator Serial Number: 7588973
Lead Channel Impedance Value: 460 Ohm
Lead Channel Sensing Intrinsic Amplitude: 3.4 mV
Lead Channel Sensing Intrinsic Amplitude: 6.9 mV
Lead Channel Setting Pacing Amplitude: 2 V
Lead Channel Setting Pacing Amplitude: 2.5 V
Lead Channel Setting Pacing Pulse Width: 0.4 ms
Lead Channel Setting Sensing Sensitivity: 2 mV
MDC IDC MSMT BATTERY REMAINING LONGEVITY: 107 mo
MDC IDC MSMT LEADCHNL RV IMPEDANCE VALUE: 490 Ohm
MDC IDC STAT BRADY AP VS PERCENT: 1 %
MDC IDC STAT BRADY AS VP PERCENT: 83 %
MDC IDC STAT BRADY AS VS PERCENT: 1 %
MDC IDC STAT BRADY RV PERCENT PACED: 99 %

## 2013-11-11 NOTE — Progress Notes (Signed)
Remote pacemaker transmission.   

## 2013-11-11 NOTE — Telephone Encounter (Signed)
Spoke with pt and reminded pt of remote transmission that is due today. Pt verbalized understanding.   

## 2013-11-11 NOTE — Telephone Encounter (Signed)
Left message with pt friend for pt to return call.

## 2013-11-21 ENCOUNTER — Encounter: Payer: Self-pay | Admitting: Cardiology

## 2013-11-26 ENCOUNTER — Encounter: Payer: Self-pay | Admitting: Internal Medicine

## 2013-12-12 ENCOUNTER — Encounter (HOSPITAL_COMMUNITY): Payer: Self-pay | Admitting: Internal Medicine

## 2014-01-28 ENCOUNTER — Other Ambulatory Visit: Payer: Self-pay | Admitting: Primary Care

## 2014-01-28 ENCOUNTER — Ambulatory Visit
Admission: RE | Admit: 2014-01-28 | Discharge: 2014-01-28 | Disposition: A | Discharge status: Home / Self Care | Payer: Medicaid Other | Source: Ambulatory Visit | Attending: Primary Care | Admitting: Primary Care

## 2014-01-28 DIAGNOSIS — R059 Cough, unspecified: Secondary | ICD-10-CM

## 2014-01-28 DIAGNOSIS — R05 Cough: Secondary | ICD-10-CM

## 2014-02-04 ENCOUNTER — Ambulatory Visit (INDEPENDENT_AMBULATORY_CARE_PROVIDER_SITE_OTHER): Payer: Medicare Other | Admitting: Internal Medicine

## 2014-02-04 ENCOUNTER — Encounter: Payer: Self-pay | Admitting: Internal Medicine

## 2014-02-04 VITALS — BP 144/86 | HR 76 | Ht 64.0 in | Wt 211.0 lb

## 2014-02-04 DIAGNOSIS — J209 Acute bronchitis, unspecified: Secondary | ICD-10-CM

## 2014-02-04 DIAGNOSIS — Z950 Presence of cardiac pacemaker: Secondary | ICD-10-CM

## 2014-02-04 DIAGNOSIS — I10 Essential (primary) hypertension: Secondary | ICD-10-CM

## 2014-02-04 DIAGNOSIS — I442 Atrioventricular block, complete: Secondary | ICD-10-CM

## 2014-02-04 LAB — MDC_IDC_ENUM_SESS_TYPE_INCLINIC
Battery Voltage: 3.02 V
Brady Statistic RV Percent Paced: 99.37 %
Date Time Interrogation Session: 20160202104655
Implantable Pulse Generator Model: 2240
Implantable Pulse Generator Serial Number: 7588973
Lead Channel Impedance Value: 550 Ohm
Lead Channel Pacing Threshold Amplitude: 0.375 V
Lead Channel Pacing Threshold Amplitude: 0.75 V
Lead Channel Pacing Threshold Pulse Width: 0.4 ms
Lead Channel Sensing Intrinsic Amplitude: 5 mV
Lead Channel Sensing Intrinsic Amplitude: 7.5 mV
Lead Channel Setting Pacing Amplitude: 0.625
Lead Channel Setting Pacing Pulse Width: 0.4 ms
MDC IDC MSMT BATTERY REMAINING LONGEVITY: 134.4 mo
MDC IDC MSMT LEADCHNL RA IMPEDANCE VALUE: 475 Ohm
MDC IDC MSMT LEADCHNL RA PACING THRESHOLD AMPLITUDE: 0.75 V
MDC IDC MSMT LEADCHNL RA PACING THRESHOLD PULSEWIDTH: 0.4 ms
MDC IDC MSMT LEADCHNL RV PACING THRESHOLD PULSEWIDTH: 0.4 ms
MDC IDC SET LEADCHNL RA PACING AMPLITUDE: 2 V
MDC IDC SET LEADCHNL RV SENSING SENSITIVITY: 2 mV
MDC IDC STAT BRADY RA PERCENT PACED: 16 %

## 2014-02-04 NOTE — Progress Notes (Signed)
HPI Jessica Carpenter returns today for followup. She is a pleasant 64 yo woman with a h/o HTN who developed CHB, and is s/p PPM insertion. In the interim, she has done well. No fever or chills.  Allergies  Allergen Reactions  . Moxifloxacin Shortness Of Breath and Other (See Comments)    Caused syncope and throat tightness  . Penicillins Hives, Shortness Of Breath and Other (See Comments)    Caused syncope and throat tightness     Current Outpatient Prescriptions  Medication Sig Dispense Refill  . albuterol (PROVENTIL HFA;VENTOLIN HFA) 108 (90 BASE) MCG/ACT inhaler Inhale 1-2 puffs into the lungs every 4 (four) hours as needed for wheezing or shortness of breath.    Marland Kitchen amLODipine (NORVASC) 10 MG tablet Take 10 mg by mouth daily.    Marland Kitchen aspirin EC 81 MG tablet Take 81 mg by mouth daily.    . cyclobenzaprine (FLEXERIL) 10 MG tablet Take 10 mg by mouth at bedtime as needed for muscle spasms.    . fluticasone (FLONASE) 50 MCG/ACT nasal spray Place 1 spray into both nostrils daily.    . Fluticasone-Salmeterol (ADVAIR) 250-50 MCG/DOSE AEPB Inhale 1 puff into the lungs 2 (two) times daily.    . hydrochlorothiazide (HYDRODIURIL) 25 MG tablet Take 12.5 mg by mouth daily.     Marland Kitchen losartan (COZAAR) 100 MG tablet Take 100 mg by mouth daily.    . meloxicam (MOBIC) 7.5 MG tablet Take 7.5 mg by mouth daily.    . metFORMIN (GLUCOPHAGE-XR) 500 MG 24 hr tablet Take 500 mg by mouth daily with breakfast.    . nystatin (MYCOSTATIN) 100000 UNIT/ML suspension Take 4 mLs by mouth 4 (four) times daily as needed (mouth sores).     . traMADol (ULTRAM) 50 MG tablet Take 1 tablet (50 mg total) by mouth every 12 (twelve) hours as needed for moderate pain (as for persistent cough). 15 tablet 0   No current facility-administered medications for this visit.     Past Medical History  Diagnosis Date  . Hypertension   . Asthma   . Gout     "used to; haven't had it lately" (02/04/2013)  . PONV (postoperative nausea and  vomiting)   . Chronic bronchitis     "usually get it q yr" (02/04/2013)  . Type II diabetes mellitus   . GERD (gastroesophageal reflux disease)   . Arthritis     "left leg" (02/04/2013)  . Symptomatic bradycardia     s/p STJ dual chamber pacemaker by Dr Lovena Le 02/2013    ROS:   All systems reviewed and negative except as noted in the HPI.   Past Surgical History  Procedure Laterality Date  . Ganglion cyst excision Right 2003    Ganglion of wrist, volar and distal radius  . Pacemaker insertion  02/04/2013    STJ Assurity dual chamber pacemaker implanted by Dr Lovena Le for symptomatic bradycardia  . Abdominal hysterectomy  ?1980's  . Dilation and curettage of uterus    . Tubal ligation    . Breast biopsy Right     "benign"  . Breast lumpectomy Right     "benign"  . Permanent pacemaker insertion N/A 02/04/2013    Procedure: PERMANENT PACEMAKER INSERTION;  Surgeon: Evans Lance, MD;  Location: [facility] CATH LAB;  Service: Cardiovascular;  Laterality: N/A;     No family history on file.   History   Social History  . Marital Status: Divorced    Spouse Name: N/A  Number of Children: N/A  . Years of Education: N/A   Occupational History  . Not on file.   Social History Main Topics  . Smoking status: Never Smoker   . Smokeless tobacco: Never Used  . Alcohol Use: Yes     Comment: 02/04/2013 "2 shots of liquor; maybe q 3 wks"  . Drug Use: No  . Sexual Activity: No   Other Topics Concern  . Not on file   Social History Narrative     BP 144/86 mmHg  Pulse 76  Ht 5\' 4"  (1.626 m)  Wt 211 lb (95.709 kg)  BMI 36.20 kg/m2  Physical Exam:  Well appearing middle aged woman, NAD HEENT: Unremarkable Neck:  No JVD, no thyromegally Back:  No CVA tenderness Lungs:  Clear with no wheezes HEART:  Regular rate rhythm, no murmurs, no rubs, no clicks Abd:  soft, positive bowel sounds, no organomegally, no rebound, no guarding Ext:  2 plus pulses, no edema, no cyanosis, no  clubbing Skin:  No rashes no nodules Neuro:  CN II through XII intact, motor grossly intact   DEVICE  Normal device function.  See PaceArt for details.   Assess/Plan:

## 2014-02-04 NOTE — Patient Instructions (Signed)
Your physician wants you to follow-up in: 12 months with Dr. Knox Saliva will receive a reminder letter in the mail two months in advance. If you don't receive a letter, please call our office to schedule the follow-up appointment.  Remote monitoring is used to monitor your Pacemaker of ICD from home. This monitoring reduces the number of office visits required to check your device to one time per year. It allows [place] to keep an eye on the functioning of your device to ensure it is working properly. You are scheduled for a device check from home on 05/06/14. You may send your transmission at any time that day. If you have a wireless device, the transmission will be sent automatically. After your physician reviews your transmission, you will receive a postcard with your next transmission date.

## 2014-02-04 NOTE — Assessment & Plan Note (Signed)
She is currently without productive cough. She will continue her bronchodilators.

## 2014-02-04 NOTE — Assessment & Plan Note (Signed)
Her St. Jude DDD PM is working normally. Will recheck in several months.  

## 2014-02-04 NOTE — Assessment & Plan Note (Signed)
Her blood pressure is slightly elevated. She will continue her current meds. I have asked her to try and lose weight and reduce the sodium intake.

## 2014-02-05 ENCOUNTER — Encounter: Payer: Self-pay | Admitting: Internal Medicine

## 2014-02-22 ENCOUNTER — Emergency Department (INDEPENDENT_AMBULATORY_CARE_PROVIDER_SITE_OTHER)
Admission: EM | Admit: 2014-02-22 | Discharge: 2014-02-22 | Disposition: A | Discharge status: Home / Self Care | Payer: Medicare Other | Source: Home / Self Care | Attending: Emergency Medicine | Admitting: Emergency Medicine

## 2014-02-22 ENCOUNTER — Encounter (HOSPITAL_COMMUNITY): Payer: Self-pay | Admitting: *Deleted

## 2014-02-22 DIAGNOSIS — J40 Bronchitis, not specified as acute or chronic: Secondary | ICD-10-CM | POA: Diagnosis not present

## 2014-02-22 MED ORDER — PREDNISONE 10 MG PO TABS: ORAL_TABLET | ORAL | Status: DC

## 2014-02-22 MED ORDER — NYSTATIN 100000 UNIT/ML MT SUSP: 5.0000 mL | Freq: Four times a day (QID) | OROMUCOSAL | Status: DC | PRN

## 2014-02-22 NOTE — ED Provider Notes (Signed)
CSN: [id number]     Arrival date & time 02/22/14  4132 History   First MD Initiated Contact with Patient 02/22/14 1018     Chief Complaint  Patient presents with  . Cough   (Consider location/radiation/quality/duration/timing/severity/associated sxs/prior Treatment) HPI She is a 64 year old woman here for evaluation of cough. She states her cough started about 2 weeks ago. At that time she was treated with a Z-Pak, which did not seem to help much. She reports some mild rhinorrhea. Her primary complaint is the cough and chest congestion. She will cough up green sputum. She has been taking Mucinex with temporary improvement. She has a history of asthma and has been using her Advair daily and her albuterol inhaler as needed.  She also complains of mouth and tongue soreness which she attributes to her Advair inhaler.  Past Medical History  Diagnosis Date  . Hypertension   . Asthma   . Gout     "used to; haven't had it lately" (02/04/2013)  . PONV (postoperative nausea and vomiting)   . Chronic bronchitis     "usually get it q yr" (02/04/2013)  . Type II diabetes mellitus   . GERD (gastroesophageal reflux disease)   . Arthritis     "left leg" (02/04/2013)  . 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 Ganglion cyst excision Right 2003    Ganglion of wrist, volar and distal radius  . Pacemaker insertion  02/04/2013    STJ Assurity dual chamber pacemaker implanted by Dr Lovena Le for symptomatic bradycardia  . Abdominal hysterectomy  ?1980's  . Dilation and curettage of uterus    . Tubal ligation    . Breast biopsy Right     "benign"  . Breast lumpectomy Right     "benign"  . Permanent pacemaker insertion N/A 02/04/2013    Procedure: PERMANENT PACEMAKER INSERTION;  Surgeon: Evans Lance, MD;  Location: [facility] CATH LAB;  Service: Cardiovascular;  Laterality: N/A;   No family history on file. History   Substance Use Topics  . Smoking status: Never Smoker   . Smokeless tobacco: Never Used  . Alcohol Use: Yes   OB History    No data available     Review of Systems  Constitutional: Negative for fever.  HENT: Positive for rhinorrhea. Negative for congestion and sore throat.   Respiratory: Positive for cough. Negative for shortness of breath and wheezing.     Allergies  Moxifloxacin and Penicillins  Home Medications   Prior to Admission medications   Medication Sig Start Date End Date Taking? Authorizing Provider  albuterol (PROVENTIL HFA;VENTOLIN HFA) 108 (90 BASE) MCG/ACT inhaler Inhale 1-2 puffs into the lungs every 4 (four) hours as needed for wheezing or shortness of breath.   Yes Historical Provider, MD  amLODipine (NORVASC) 10 MG tablet Take 10 mg by mouth daily.   Yes Historical Provider, MD  aspirin EC 81 MG tablet Take 81 mg by mouth daily.   Yes Historical Provider, MD  Fluticasone-Salmeterol (ADVAIR) 250-50 MCG/DOSE AEPB Inhale 1 puff into the lungs 2 (two) times daily.   Yes Historical Provider, MD  hydrochlorothiazide (HYDRODIURIL) 25 MG tablet Take 12.5 mg by mouth daily.    Yes Historical Provider, MD  losartan (COZAAR) 100 MG tablet Take 100 mg by mouth daily.   Yes Historical Provider, MD  meloxicam (MOBIC) 7.5 MG tablet Take 7.5 mg by mouth daily.  Yes Historical Provider, MD  metFORMIN (GLUCOPHAGE-XR) 500 MG 24 hr tablet Take 500 mg by mouth daily with breakfast.   Yes Historical Provider, MD  cyclobenzaprine (FLEXERIL) 10 MG tablet Take 10 mg by mouth at bedtime as needed for muscle spasms.    Historical Provider, MD  fluticasone (FLONASE) 50 MCG/ACT nasal spray Place 1 spray into both nostrils daily.    Historical Provider, MD  nystatin (MYCOSTATIN) 100000 UNIT/ML suspension Take 5 mLs (500,000 Units total) by mouth 4 (four) times daily as needed (mouth sores). 02/22/14   Melony Overly, MD  predniSONE (DELTASONE) 10 MG tablet Take 4 tablets for 3 days, then 3  tablets for 3 days, then 2 tablets for 3 days, then 1 tablet for 3 days. 02/22/14   Melony Overly, MD  traMADol (ULTRAM) 50 MG tablet Take 1 tablet (50 mg total) by mouth every 12 (twelve) hours as needed for moderate pain (as for persistent cough). 08/15/13   Annett Gula H Presson, PA   BP 148/84 mmHg  Pulse 60  Temp(Src) 98.1 F (36.7 C) (Oral)  Resp 20  SpO2 98% Physical Exam  Constitutional: She is oriented to person, place, and time. She appears well-developed and well-nourished. No distress.  HENT:  Mouth/Throat: Oropharynx is clear and moist.  Neck: Neck supple.  Cardiovascular: Normal rate, regular rhythm and normal heart sounds.   No murmur heard. Pulmonary/Chest: Effort normal and breath sounds normal. No respiratory distress. She has no wheezes. She has no rales.  Neurological: She is alert and oriented to person, place, and time.    ED Course  Procedures (including critical care time) Labs Review Labs Reviewed - No data to display  Imaging Review No results found.   MDM   1. Bronchitis    Her current symptoms are likely due to residual airway inflammation. We'll treat with a prednisone taper. Also provided refill of nystatin solution to use for the next week for possible thrush. Follow-up as needed.   Melony Overly, MD 02/22/14 [phone]

## 2014-02-22 NOTE — ED Notes (Signed)
C/O cough x 2 wks - completed Z-pak at start of cough without any improvement.  Has also been taking Mucinex and using albuterol regularly without significant relief.  Denies fevers.  C/O productive cough with "dark" sputum.

## 2014-02-22 NOTE — Discharge Instructions (Signed)
You likely have some lingering airway inflammation from your bronchitis. Take that prednisone taper as prescribed. Use the nystatin powder 4 times a day for the next week. After you use Advair, please rinse your mouth out to prevent thrush in the future. Follow-up as needed.

## 2014-05-06 ENCOUNTER — Ambulatory Visit (INDEPENDENT_AMBULATORY_CARE_PROVIDER_SITE_OTHER): Payer: Medicare Other | Admitting: *Deleted

## 2014-05-06 DIAGNOSIS — I442 Atrioventricular block, complete: Secondary | ICD-10-CM | POA: Diagnosis not present

## 2014-05-06 NOTE — Progress Notes (Signed)
Remote pacemaker transmission.   

## 2014-05-10 LAB — CUP PACEART REMOTE DEVICE CHECK
Battery Remaining Longevity: 130 mo
Battery Remaining Percentage: 95.5 %
Brady Statistic AP VS Percent: 1 %
Brady Statistic AS VP Percent: 84 %
Brady Statistic AS VS Percent: 1 %
Brady Statistic RA Percent Paced: 15 %
Brady Statistic RV Percent Paced: 99 %
Date Time Interrogation Session: 20160503074902
Lead Channel Impedance Value: 490 Ohm
Lead Channel Pacing Threshold Amplitude: 0.5 V
Lead Channel Pacing Threshold Amplitude: 0.75 V
Lead Channel Pacing Threshold Pulse Width: 0.4 ms
Lead Channel Sensing Intrinsic Amplitude: 8.1 mV
Lead Channel Setting Pacing Amplitude: 0.75 V
Lead Channel Setting Pacing Amplitude: 2 V
Lead Channel Setting Sensing Sensitivity: 2 mV
MDC IDC MSMT BATTERY VOLTAGE: 3.02 V
MDC IDC MSMT LEADCHNL RA IMPEDANCE VALUE: 460 Ohm
MDC IDC MSMT LEADCHNL RA SENSING INTR AMPL: 4.8 mV
MDC IDC MSMT LEADCHNL RV PACING THRESHOLD PULSEWIDTH: 0.4 ms
MDC IDC SET LEADCHNL RV PACING PULSEWIDTH: 0.4 ms
MDC IDC STAT BRADY AP VP PERCENT: 15 %
Pulse Gen Model: 2240
Pulse Gen Serial Number: 7588973

## 2014-05-15 ENCOUNTER — Encounter: Payer: Self-pay | Admitting: Cardiology

## 2014-05-22 ENCOUNTER — Encounter: Payer: Self-pay | Admitting: Internal Medicine

## 2014-06-24 ENCOUNTER — Ambulatory Visit: Payer: Medicare Other | Admitting: Emergency Medicine

## 2014-06-24 ENCOUNTER — Encounter: Payer: Self-pay | Admitting: *Deleted

## 2014-06-24 VITALS — BP 118/80 | HR 64 | Ht 64.0 in | Wt 206.0 lb

## 2014-06-24 DIAGNOSIS — J4520 Mild intermittent asthma, uncomplicated: Secondary | ICD-10-CM

## 2014-06-24 MED ORDER — OMEPRAZOLE 20 MG PO CPDR: 20.0000 mg | DELAYED_RELEASE_CAPSULE | Freq: Two times a day (BID) | ORAL | Status: DC

## 2014-06-24 NOTE — Patient Instructions (Signed)
Please continue your Advair twice a day as you have been taking it. Remember to gargle and rinse after using this medicine Stop Qvar for now Continue your Proventil 2 puffs as needed Please start omeprazole 20 mg twice a day. Take this medication either 1 hour before or 1 hour after eating Restart your fluticasone nasal spray, 2 sprays each nostril once a day We will perform full pulmonary function testing at your next office visit Follow with Dr Lamonte Sakai in 1 month with full PFT

## 2014-06-24 NOTE — Progress Notes (Signed)
Subjective:    Patient ID: Jessica Carpenter, female    DOB: 09/30/1950, 64 y.o.   MRN: [id number]  HPI 64 year old never smoker with a history of childhood asthma,  hypertension, symptomatic bradycardia status post pacemaker placement, allergic rhinitis, GERD. She carries a history of asthma and chronic bronchitis since ~2004. She has been on advair 500 and recently added QVAR. Also on singulair. Uses albuterol prn about 2-3 x a day/   She is having dyspnea on exertion that causes her to need to sit and rest. She has freq cough most bothersome at night, can wake her from sleep. Has hoarseness.  She has episodes when the weather changes, mid chest discomfort, dyspnea, increased cough. She flares about 3-5 x a year, sometimes needs pred. She has nasal congestion. She was on fluticasone but ran out a week ago. She has GERD symptoms, not on meds. She doesn't also rinse after taking her Advair.    Review of Systems  Constitutional: Negative for fever and unexpected weight change.  HENT: Negative for congestion, dental problem, ear pain, postnasal drip, rhinorrhea, sinus pressure, sneezing, sore throat and trouble swallowing.   Eyes: Negative for redness and itching.  Respiratory: Positive for cough, chest tightness, shortness of breath and wheezing.   Cardiovascular: Negative for palpitations and leg swelling.  Gastrointestinal: Negative for nausea and vomiting.  Genitourinary: Negative for dysuria.  Musculoskeletal: Negative for joint swelling.  Neurological: Negative for headaches.  Hematological: Bruises/bleeds easily.  Psychiatric/Behavioral: Negative for dysphoric mood. The patient is not nervous/anxious.    Past Medical History  Diagnosis Date  . Hypertension   . Asthma   . Gout     "used to; haven't had it lately" (02/04/2013)  . PONV (postoperative nausea and vomiting)   . Chronic bronchitis     "usually get it q yr" (02/04/2013)  . Type II diabetes mellitus   . GERD  (gastroesophageal reflux disease)   . Arthritis     "left leg" (02/04/2013)  . Symptomatic bradycardia     s/p STJ dual chamber pacemaker by Dr Lovena Le 02/2013  . Allergic rhinitis      Family History  Problem Relation Age of Onset  . Emphysema Father   . Breast cancer Mother      History   Social History  . Marital Status: Divorced    Spouse Name: N/A  . Number of Children: N/A  . Years of Education: N/A   Occupational History  . disabled     Social History Main Topics  . Smoking status: Never Smoker   . Smokeless tobacco: Never Used  . Alcohol Use: 0.0 oz/week    0 Standard drinks or equivalent per week     Comment: 1 rarely   . Drug Use: No  . Sexual Activity: Not on file   Other Topics Concern  . Not on file   Social History Narrative     Allergies  Allergen Reactions  . Moxifloxacin Shortness Of Breath and Other (See Comments)    Caused syncope and throat tightness  . Penicillins Hives, Shortness Of Breath and Other (See Comments)    Caused syncope and throat tightness     Outpatient Prescriptions Prior to Visit  Medication Sig Dispense Refill  . albuterol (PROVENTIL HFA;VENTOLIN HFA) 108 (90 BASE) MCG/ACT inhaler Inhale 1-2 puffs into the lungs every 4 (four) hours as needed for wheezing or shortness of breath.    Marland Kitchen amLODipine (NORVASC) 10 MG tablet Take 10 mg by mouth daily.    Marland Kitchen  aspirin EC 81 MG tablet Take 81 mg by mouth daily.    . fluticasone (FLONASE) 50 MCG/ACT nasal spray Place 1 spray into both nostrils daily.    . Fluticasone-Salmeterol (ADVAIR) 250-50 MCG/DOSE AEPB Inhale 1 puff into the lungs 2 (two) times daily.    . hydrochlorothiazide (HYDRODIURIL) 25 MG tablet Take 12.5 mg by mouth daily.     Marland Kitchen losartan (COZAAR) 100 MG tablet Take 100 mg by mouth daily.    . meloxicam (MOBIC) 7.5 MG tablet Take 7.5 mg by mouth daily.    . metFORMIN (GLUCOPHAGE-XR) 500 MG 24 hr tablet Take 500 mg by mouth daily with breakfast.    . nystatin (MYCOSTATIN)  100000 UNIT/ML suspension Take 5 mLs (500,000 Units total) by mouth 4 (four) times daily as needed (mouth sores). 180 mL 0  . cyclobenzaprine (FLEXERIL) 10 MG tablet Take 10 mg by mouth at bedtime as needed for muscle spasms.    . traMADol (ULTRAM) 50 MG tablet Take 1 tablet (50 mg total) by mouth every 12 (twelve) hours as needed for moderate pain (as for persistent cough). (Patient not taking: Reported on 06/24/2014) 15 tablet 0  . predniSONE (DELTASONE) 10 MG tablet Take 4 tablets for 3 days, then 3 tablets for 3 days, then 2 tablets for 3 days, then 1 tablet for 3 days. (Patient not taking: Reported on 06/24/2014) 30 tablet 0   No facility-administered medications prior to visit.         Objective:   Physical Exam Filed Vitals:   06/24/14 1545  BP: 118/80  Pulse: 64  Height: 5\' 4"  (1.626 m)  Weight: 206 lb (93.441 kg)  SpO2: 98%   Gen: Pleasant, well-nourished, in no distress,  normal affect  ENT: No lesions,  mouth clear,  oropharynx clear, no postnasal drip  Neck: No JVD, no TMG, no carotid bruits  Lungs: No use of accessory muscles, no dullness to percussion, clear without rales or rhonchi  Cardiovascular: RRR, heart sounds normal, no murmur or gallops, no peripheral edema  Musculoskeletal: No deformities, no cyanosis or clubbing  Neuro: alert, non focal  Skin: Warm, no lesions or rashes     Assessment & Plan:  No problem-specific assessment & plan notes found for this encounter.

## 2014-07-30 ENCOUNTER — Ambulatory Visit (INDEPENDENT_AMBULATORY_CARE_PROVIDER_SITE_OTHER): Payer: Medicare Other | Admitting: Emergency Medicine

## 2014-07-30 DIAGNOSIS — J4520 Mild intermittent asthma, uncomplicated: Secondary | ICD-10-CM

## 2014-07-30 LAB — PULMONARY FUNCTION TEST
DL/VA % pred: 100 %
DL/VA: 4.95 ml/min/mmHg/L
DLCO unc % pred: 42 %
DLCO unc: 10.97 ml/min/mmHg
FEF 25-75 Post: 2.31 L/sec
FEF 25-75 Pre: 2.16 L/sec
FEF2575-%Change-Post: 6 %
FEF2575-%Pred-Post: 119 %
FEF2575-%Pred-Pre: 111 %
FEV1-%Change-Post: 4 %
FEV1-%Pred-Post: 92 %
FEV1-%Pred-Pre: 88 %
FEV1-Post: 1.89 L
FEV1-Pre: 1.81 L
FEV1FVC-%Change-Post: 2 %
FEV1FVC-%Pred-Pre: 105 %
FEV6-%Change-Post: 1 %
FEV6-%Pred-Post: 88 %
FEV6-%Pred-Pre: 87 %
FEV6-Post: 2.24 L
FEV6-Pre: 2.19 L
FEV6FVC-%Pred-Post: 104 %
FEV6FVC-%Pred-Pre: 104 %
FVC-%Change-Post: 1 %
FVC-%Pred-Post: 85 %
FVC-%Pred-Pre: 83 %
FVC-Post: 2.24 L
FVC-Pre: 2.19 L
Post FEV1/FVC ratio: 85 %
Post FEV6/FVC ratio: 100 %
Pre FEV1/FVC ratio: 83 %
Pre FEV6/FVC Ratio: 100 %

## 2014-07-30 NOTE — Progress Notes (Signed)
PFT done today. 

## 2014-07-31 ENCOUNTER — Encounter: Payer: Self-pay | Admitting: Emergency Medicine

## 2014-07-31 ENCOUNTER — Ambulatory Visit (INDEPENDENT_AMBULATORY_CARE_PROVIDER_SITE_OTHER): Payer: Medicare Other | Admitting: Emergency Medicine

## 2014-07-31 VITALS — BP 132/82 | HR 64 | Ht 64.5 in | Wt 209.0 lb

## 2014-07-31 DIAGNOSIS — J4520 Mild intermittent asthma, uncomplicated: Secondary | ICD-10-CM | POA: Diagnosis not present

## 2014-07-31 MED ORDER — FLUTICASONE PROPIONATE 50 MCG/ACT NA SUSP: 2.0000 | Freq: Every day | NASAL | Status: DC

## 2014-07-31 NOTE — Assessment & Plan Note (Signed)
Minimal obstruction on pulmonary function testing performed 07/30/14. Based on her symptoms and her request I will leave her on scheduled medication. We will continue Advair twice a day, albuterol when necessary, treat her allergies and GERD as aggressively as possible

## 2014-07-31 NOTE — Progress Notes (Signed)
Subjective:    Patient ID: Jessica Carpenter, female    DOB: May 20, 1950, 64 y.o.   MRN: [id number]  HPI 64 year old never smoker with a history of childhood asthma,  hypertension, symptomatic bradycardia status post pacemaker placement, allergic rhinitis, GERD. She carries a history of asthma and chronic bronchitis since ~2004. She has been on advair 500 and recently added QVAR. Also on singulair. Uses albuterol prn about 2-3 x a day/   She is having dyspnea on exertion that causes her to need to sit and rest. She has freq cough most bothersome at night, can wake her from sleep. Has hoarseness.  She has episodes when the weather changes, mid chest discomfort, dyspnea, increased cough. She flares about 3-5 x a year, sometimes needs pred. She has nasal congestion. She was on fluticasone but ran out a week ago. She has GERD symptoms, not on meds. She doesn't also rinse after taking her Advair.   Follow-up visit in 07/31/14 -- patient follows up for asthma, has been poorly controlled. At her last visit we continued Advair but stopped Qvar. She underwent pulmonary function testing on 07/30/14 that I have reviewed myself, showed normal airflows without a bronchodilator response. The flow volume loop did have a concave curve suggestive of mild obstruction.  We tried to initiate better therapy for GERD and also restarted fluticasone at her first visit - she never got the flonase script. She is using albuterol bid, Advair bid. She describes cough, phlegm. She believes her GERD sx are significantly better.           Review of Systems  Constitutional: Negative for fever and unexpected weight change.  HENT: Negative for congestion, dental problem, ear pain, postnasal drip, rhinorrhea, sinus pressure, sneezing, sore throat and trouble swallowing.   Eyes: Negative for redness and itching.  Respiratory: Positive for cough, chest tightness, shortness of breath and wheezing.   Cardiovascular: Negative for  palpitations and leg swelling.  Gastrointestinal: Negative for nausea and vomiting.  Genitourinary: Negative for dysuria.  Musculoskeletal: Negative for joint swelling.  Neurological: Negative for headaches.  Hematological: Bruises/bleeds easily.  Psychiatric/Behavioral: Negative for dysphoric mood. The patient is not nervous/anxious.    Past Medical History  Diagnosis Date  . Hypertension   . Asthma   . Gout     "used to; haven't had it lately" (02/04/2013)  . PONV (postoperative nausea and vomiting)   . Chronic bronchitis     "usually get it q yr" (02/04/2013)  . Type II diabetes mellitus   . GERD (gastroesophageal reflux disease)   . Arthritis     "left leg" (02/04/2013)  . Symptomatic bradycardia     s/p STJ dual chamber pacemaker by Dr Lovena Le 02/2013  . Allergic rhinitis      Family History  Problem Relation Age of Onset  . Emphysema Father   . Breast cancer Mother      History   Social History  . Marital Status: Divorced    Spouse Name: N/A  . Number of Children: N/A  . Years of Education: N/A   Occupational History  . disabled     Social History Main Topics  . Smoking status: Never Smoker   . Smokeless tobacco: Never Used  . Alcohol Use: 0.0 oz/week    0 Standard drinks or equivalent per week     Comment: 1 rarely   . Drug Use: No  . Sexual Activity: Not on file   Other Topics Concern  . Not on file  Social History Narrative     Allergies  Allergen Reactions  . Moxifloxacin Shortness Of Breath and Other (See Comments)    Caused syncope and throat tightness  . Penicillins Hives, Shortness Of Breath and Other (See Comments)    Caused syncope and throat tightness     Outpatient Prescriptions Prior to Visit  Medication Sig Dispense Refill  . albuterol (PROVENTIL HFA;VENTOLIN HFA) 108 (90 BASE) MCG/ACT inhaler Inhale 1-2 puffs into the lungs every 4 (four) hours as needed for wheezing or shortness of breath.    Marland Kitchen amLODipine (NORVASC) 10 MG tablet Take  10 mg by mouth daily.    Marland Kitchen aspirin EC 81 MG tablet Take 81 mg by mouth daily.    . fluticasone (FLONASE) 50 MCG/ACT nasal spray Place 1 spray into both nostrils daily.    . Fluticasone-Salmeterol (ADVAIR) 250-50 MCG/DOSE AEPB Inhale 1 puff into the lungs 2 (two) times daily.    . hydrochlorothiazide (HYDRODIURIL) 25 MG tablet Take 12.5 mg by mouth daily.     Marland Kitchen losartan (COZAAR) 100 MG tablet Take 100 mg by mouth daily.    . metFORMIN (GLUCOPHAGE-XR) 500 MG 24 hr tablet Take 500 mg by mouth daily with breakfast.    . nystatin (MYCOSTATIN) 100000 UNIT/ML suspension Take 5 mLs (500,000 Units total) by mouth 4 (four) times daily as needed (mouth sores). 180 mL 0  . omeprazole (PRILOSEC) 20 MG capsule Take 1 capsule (20 mg total) by mouth 2 (two) times daily before a meal. 30 capsule 5  . cyclobenzaprine (FLEXERIL) 10 MG tablet Take 10 mg by mouth at bedtime as needed for muscle spasms.    . meloxicam (MOBIC) 7.5 MG tablet Take 7.5 mg by mouth daily.    . traMADol (ULTRAM) 50 MG tablet Take 1 tablet (50 mg total) by mouth every 12 (twelve) hours as needed for moderate pain (as for persistent cough). (Patient not taking: Reported on 07/31/2014) 15 tablet 0   No facility-administered medications prior to visit.         Objective:   Physical Exam Filed Vitals:   07/31/14 1613  BP: 132/82  Pulse: 64  Height: 5' 4.5" (1.638 m)  Weight: 209 lb (94.802 kg)  SpO2: 98%   Gen: Pleasant, well-nourished, in no distress,  normal affect  ENT: No lesions,  mouth clear,  oropharynx clear, no postnasal drip  Neck: No JVD, no TMG, no carotid bruits  Lungs: No use of accessory muscles, clear without rales or rhonchi  Cardiovascular: RRR, heart sounds normal, no murmur or gallops, no peripheral edema  Musculoskeletal: No deformities, no cyanosis or clubbing  Neuro: alert, non focal  Skin: Warm, no lesions or rashes     Assessment & Plan:  Intrinsic asthma Minimal obstruction on pulmonary  function testing performed 07/30/14. Based on her symptoms and her request I will leave her on scheduled medication. We will continue Advair twice a day, albuterol when necessary, treat her allergies and GERD as aggressively as possible

## 2014-07-31 NOTE — Patient Instructions (Signed)
Please continue Advair twice a day.  Please use albuterol as needed for shortness of breath Continue your omeprazole daily Restart fluticasone nasal spray, 2 sprays each side daily Follow with Dr Lamonte Sakai in 6 months or sooner if you have any problems

## 2014-08-05 ENCOUNTER — Telehealth: Payer: Self-pay | Admitting: Cardiology

## 2014-08-05 ENCOUNTER — Encounter: Payer: Self-pay | Admitting: Internal Medicine

## 2014-08-05 ENCOUNTER — Encounter: Payer: Medicare Other | Admitting: *Deleted

## 2014-08-05 ENCOUNTER — Ambulatory Visit (INDEPENDENT_AMBULATORY_CARE_PROVIDER_SITE_OTHER): Payer: Medicare Other | Admitting: *Deleted

## 2014-08-05 DIAGNOSIS — I442 Atrioventricular block, complete: Secondary | ICD-10-CM

## 2014-08-05 NOTE — Telephone Encounter (Signed)
Confirmed remote transmission w/ pt son.    

## 2014-08-06 ENCOUNTER — Encounter: Payer: Self-pay | Admitting: Cardiology

## 2014-08-13 ENCOUNTER — Telehealth: Payer: Self-pay | Admitting: Internal Medicine

## 2014-08-13 NOTE — Telephone Encounter (Signed)
New Message  Pt received letter to send in transmission that was "no show" on 08/05/14. Pt does not know how to send in signal. Please call back and discuss.

## 2014-08-13 NOTE — Telephone Encounter (Signed)
Spoke w/ pt and instructed her how to send manual transmission. Pt verbalized understanding.

## 2014-08-14 ENCOUNTER — Telehealth: Payer: Self-pay | Admitting: Internal Medicine

## 2014-08-14 NOTE — Telephone Encounter (Signed)
ERROR

## 2014-08-21 NOTE — Progress Notes (Signed)
Remote pacemaker transmission.   

## 2014-08-22 LAB — CUP PACEART REMOTE DEVICE CHECK
Battery Remaining Longevity: 128 mo
Battery Remaining Percentage: 95.5 %
Brady Statistic AP VS Percent: 1 %
Brady Statistic RV Percent Paced: 99 %
Date Time Interrogation Session: 20160802102922
Lead Channel Impedance Value: 460 Ohm
Lead Channel Pacing Threshold Amplitude: 0.5 V
Lead Channel Pacing Threshold Pulse Width: 0.4 ms
Lead Channel Sensing Intrinsic Amplitude: 5 mV
Lead Channel Setting Pacing Amplitude: 0.75 V
Lead Channel Setting Pacing Pulse Width: 0.4 ms
MDC IDC MSMT BATTERY VOLTAGE: 3.02 V
MDC IDC MSMT LEADCHNL RA PACING THRESHOLD AMPLITUDE: 0.75 V
MDC IDC MSMT LEADCHNL RV IMPEDANCE VALUE: 490 Ohm
MDC IDC MSMT LEADCHNL RV PACING THRESHOLD PULSEWIDTH: 0.4 ms
MDC IDC MSMT LEADCHNL RV SENSING INTR AMPL: 7 mV
MDC IDC SET LEADCHNL RA PACING AMPLITUDE: 2 V
MDC IDC SET LEADCHNL RV SENSING SENSITIVITY: 2 mV
MDC IDC STAT BRADY AP VP PERCENT: 16 %
MDC IDC STAT BRADY AS VP PERCENT: 83 %
MDC IDC STAT BRADY AS VS PERCENT: 1 %
MDC IDC STAT BRADY RA PERCENT PACED: 16 %
Pulse Gen Serial Number: 7588973

## 2014-08-26 ENCOUNTER — Telehealth: Payer: Self-pay | Admitting: Internal Medicine

## 2014-08-26 NOTE — Telephone Encounter (Signed)
New Message  Pt calling to speak w/ Device concerning her remote equipment. Please call back and discuss.

## 2014-08-26 NOTE — Telephone Encounter (Signed)
LMOVM w/ direct # to device clinic.  

## 2014-08-27 NOTE — Telephone Encounter (Signed)
Returned patient's call.  Patient wanted to know if remote transmission from earlier this month was received.  Received transmission from 08/05/14, patient states that this is accurate and that her adapter is working.  Patient aware to call with worsening symptoms, questions, or concerns.  Patient appreciative of call.

## 2014-08-27 NOTE — Telephone Encounter (Signed)
Pt returning [facility] phone call- pt stated her phone is broken and she could not understand VM. Please call back and discuss.

## 2014-09-03 ENCOUNTER — Telehealth: Payer: Self-pay | Admitting: Cardiology

## 2014-09-03 NOTE — Telephone Encounter (Signed)
LMOVM for pt informing her that her 08-05-14 transmission was received.

## 2014-09-04 ENCOUNTER — Encounter: Payer: Self-pay | Admitting: Cardiology

## 2014-09-28 IMAGING — CR DG FOOT 2V*R*
2 series · 2 of 2 positions shown · non-contrast
Comparison: None.

CLINICAL DATA: Foot pain

EXAM:
RIGHT FOOT - 2 VIEW

[t foot ap right]
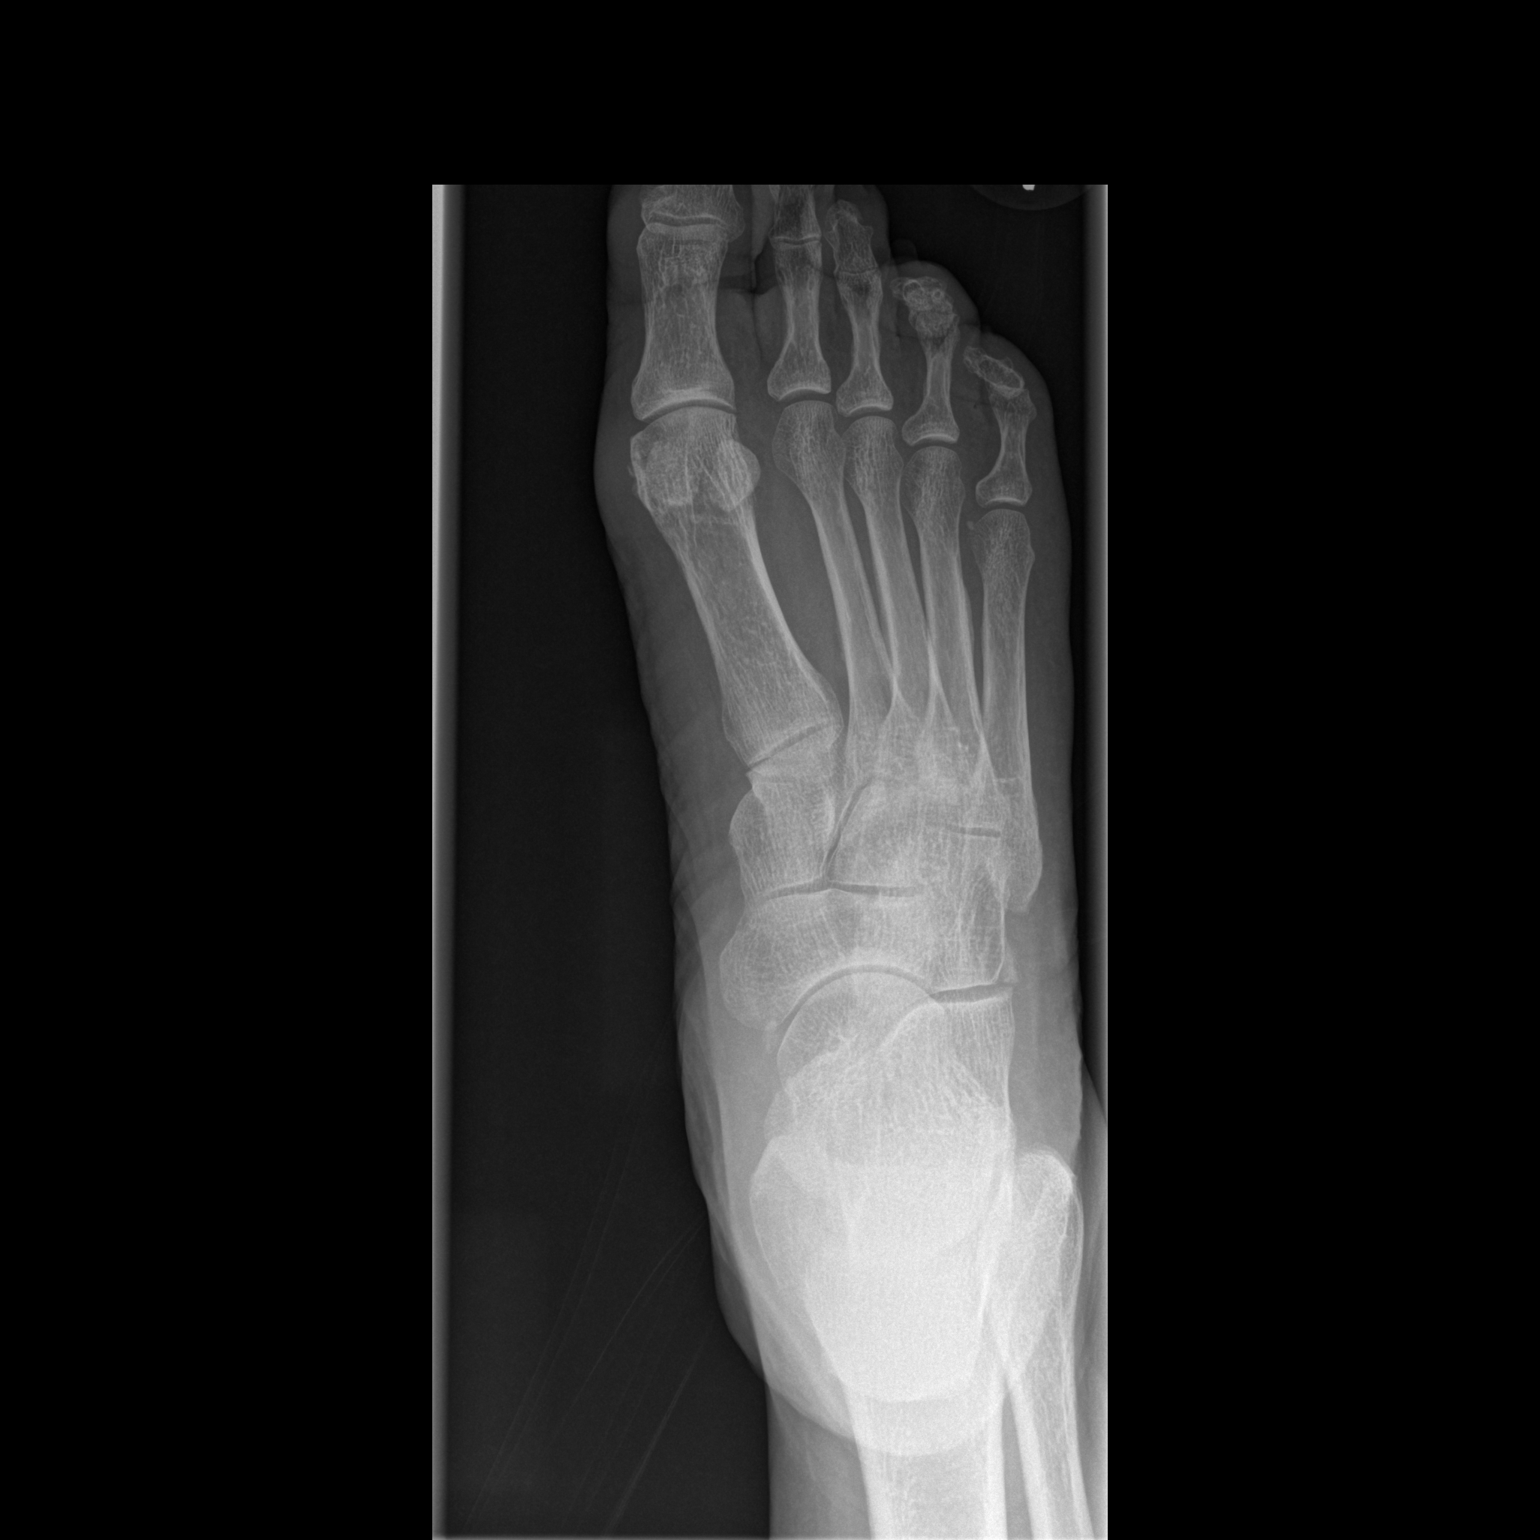

[t foot lat right]
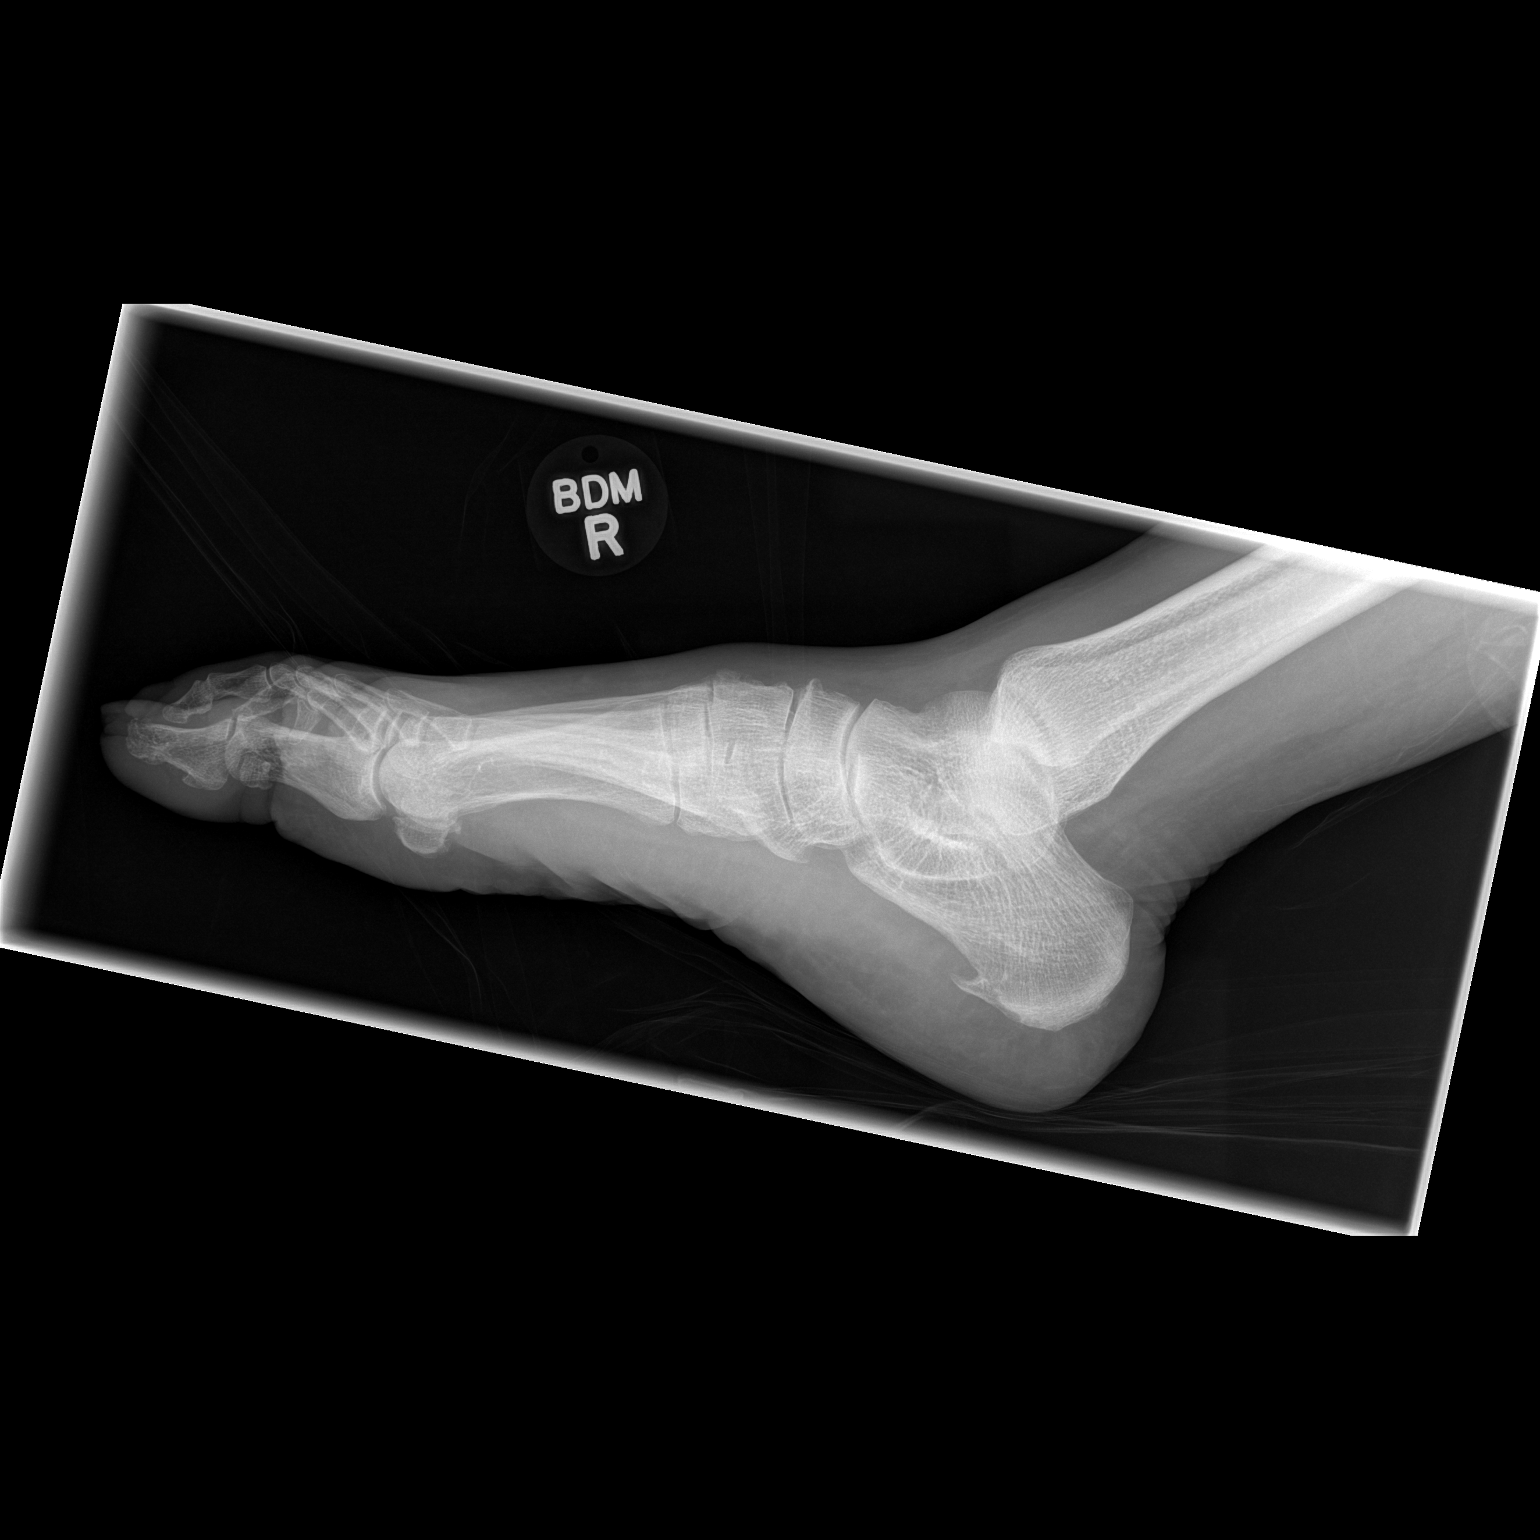

[2 of 2 positions shown; findings below may reference images not displayed]

FINDINGS: Normal alignment no fracture. No significant arthropathy. Mild
calcaneal spurring on the plantar surface.
IMPRESSION: No acute abnormality.  Mild calcaneal spurring.

## 2014-11-04 ENCOUNTER — Ambulatory Visit (INDEPENDENT_AMBULATORY_CARE_PROVIDER_SITE_OTHER): Payer: Medicare Other | Admitting: *Deleted

## 2014-11-04 DIAGNOSIS — I442 Atrioventricular block, complete: Secondary | ICD-10-CM | POA: Diagnosis not present

## 2014-11-05 NOTE — Progress Notes (Signed)
Remote pacemaker transmission.   

## 2014-11-19 ENCOUNTER — Other Ambulatory Visit: Payer: Self-pay | Admitting: *Deleted

## 2014-11-19 MED ORDER — FLUTICASONE PROPIONATE 50 MCG/ACT NA SUSP: 2.0000 | Freq: Every day | NASAL | Status: DC

## 2014-11-20 LAB — CUP PACEART REMOTE DEVICE CHECK
Battery Voltage: 3.02 V
Brady Statistic AP VP Percent: 17 %
Brady Statistic AP VS Percent: 1 %
Brady Statistic AS VP Percent: 83 %
Brady Statistic RA Percent Paced: 17 %
Brady Statistic RV Percent Paced: 99 %
Implantable Lead Implant Date: 20150202
Implantable Lead Location: 753859
Lead Channel Impedance Value: 540 Ohm
Lead Channel Pacing Threshold Amplitude: 0.625 V
Lead Channel Pacing Threshold Pulse Width: 0.4 ms
Lead Channel Sensing Intrinsic Amplitude: 5 mV
Lead Channel Setting Pacing Amplitude: 0.875
Lead Channel Setting Pacing Pulse Width: 0.4 ms
Lead Channel Setting Sensing Sensitivity: 2 mV
MDC IDC LEAD IMPLANT DT: 20150202
MDC IDC LEAD LOCATION: 753860
MDC IDC MSMT BATTERY REMAINING LONGEVITY: 127 mo
MDC IDC MSMT BATTERY REMAINING PERCENTAGE: 95.5 %
MDC IDC MSMT LEADCHNL RA IMPEDANCE VALUE: 460 Ohm
MDC IDC MSMT LEADCHNL RA PACING THRESHOLD AMPLITUDE: 0.75 V
MDC IDC MSMT LEADCHNL RA PACING THRESHOLD PULSEWIDTH: 0.4 ms
MDC IDC MSMT LEADCHNL RV SENSING INTR AMPL: 6.8 mV
MDC IDC SESS DTM: 20161101091439
MDC IDC SET LEADCHNL RA PACING AMPLITUDE: 2 V
MDC IDC STAT BRADY AS VS PERCENT: 1 %
Pulse Gen Model: 2240
Pulse Gen Serial Number: 7588973

## 2014-11-21 ENCOUNTER — Encounter: Payer: Self-pay | Admitting: Cardiology

## 2014-12-20 ENCOUNTER — Emergency Department (INDEPENDENT_AMBULATORY_CARE_PROVIDER_SITE_OTHER)
Admission: EM | Admit: 2014-12-20 | Discharge: 2014-12-20 | Disposition: A | Discharge status: Home / Self Care | Payer: Medicare Other | Source: Home / Self Care | Attending: Emergency Medicine | Admitting: Emergency Medicine

## 2014-12-20 ENCOUNTER — Encounter (HOSPITAL_COMMUNITY): Payer: Self-pay | Admitting: Emergency Medicine

## 2014-12-20 DIAGNOSIS — J3489 Other specified disorders of nose and nasal sinuses: Secondary | ICD-10-CM

## 2014-12-20 DIAGNOSIS — J069 Acute upper respiratory infection, unspecified: Secondary | ICD-10-CM | POA: Diagnosis not present

## 2014-12-20 MED ORDER — IPRATROPIUM BROMIDE 0.06 % NA SOLN: 2.0000 | Freq: Four times a day (QID) | NASAL | Status: DC

## 2014-12-20 NOTE — Discharge Instructions (Signed)
Upper Respiratory Infection, Adult For drainage may use Allegra, Claritin or Zyrtec. At nighttime, if needed for drainage may use Chlor-Trimeton 2 mg. This may cause drowsiness. Use saline nasal spray frequently. Atrovent nasal spray as directed. Drink plan fluids and stay well-hydrated Most upper respiratory infections (URIs) are a viral infection of the air passages leading to the lungs. A URI affects the nose, throat, and upper air passages. The most common type of URI is nasopharyngitis and is typically referred to as "the common cold." URIs run their course and usually go away on their own. Most of the time, a URI does not require medical attention, but sometimes a bacterial infection in the upper airways can follow a viral infection. This is called a secondary infection. Sinus and middle ear infections are common types of secondary upper respiratory infections. Bacterial pneumonia can also complicate a URI. A URI can worsen asthma and chronic obstructive pulmonary disease (COPD). Sometimes, these complications can require emergency medical care and may be life threatening.  CAUSES Almost all URIs are caused by viruses. A virus is a type of germ and can spread from one person to another.  RISKS FACTORS You may be at risk for a URI if:   You smoke.   You have chronic heart or lung disease.  You have a weakened defense (immune) system.   You are very young or very old.   You have nasal allergies or asthma.  You work in crowded or poorly ventilated areas.  You work in health care facilities or schools. SIGNS AND SYMPTOMS  Symptoms typically develop 2-3 days after you come in contact with a cold virus. Most viral URIs last 7-10 days. However, viral URIs from the influenza virus (flu virus) can last 14-18 days and are typically more severe. Symptoms may include:   Runny or stuffy (congested) nose.   Sneezing.   Cough.   Sore throat.   Headache.   Fatigue.   Fever.    Loss of appetite.   Pain in your forehead, behind your eyes, and over your cheekbones (sinus pain).  Muscle aches.  DIAGNOSIS  Your health care provider may diagnose a URI by:  Physical exam.  Tests to check that your symptoms are not due to another condition such as:  Strep throat.  Sinusitis.  Pneumonia.  Asthma. TREATMENT  A URI goes away on its own with time. It cannot be cured with medicines, but medicines may be prescribed or recommended to relieve symptoms. Medicines may help:  Reduce your fever.  Reduce your cough.  Relieve nasal congestion. HOME CARE INSTRUCTIONS   Take medicines only as directed by your health care provider.   Gargle warm saltwater or take cough drops to comfort your throat as directed by your health care provider.  Use a warm mist humidifier or inhale steam from a shower to increase air moisture. This may make it easier to breathe.  Drink enough fluid to keep your urine clear or pale yellow.   Eat soups and other clear broths and maintain good nutrition.   Rest as needed.   Return to work when your temperature has returned to normal or as your health care provider advises. You may need to stay home longer to avoid infecting others. You can also use a face mask and careful hand washing to prevent spread of the virus.  Increase the usage of your inhaler if you have asthma.   Do not use any tobacco products, including cigarettes, chewing tobacco, or electronic  cigarettes. If you need help quitting, ask your health care provider. PREVENTION  The best way to protect yourself from getting a cold is to practice good hygiene.   Avoid oral or hand contact with people with cold symptoms.   Wash your hands often if contact occurs.  There is no clear evidence that vitamin C, vitamin E, echinacea, or exercise reduces the chance of developing a cold. However, it is always recommended to get plenty of rest, exercise, and practice good  nutrition.  SEEK MEDICAL CARE IF:   You are getting worse rather than better.   Your symptoms are not controlled by medicine.   You have chills.  You have worsening shortness of breath.  You have brown or red mucus.  You have yellow or brown nasal discharge.  You have pain in your face, especially when you bend forward.  You have a fever.  You have swollen neck glands.  You have pain while swallowing.  You have white areas in the back of your throat. SEEK IMMEDIATE MEDICAL CARE IF:   You have severe or persistent:  Headache.  Ear pain.  Sinus pain.  Chest pain.  You have chronic lung disease and any of the following:  Wheezing.  Prolonged cough.  Coughing up blood.  A change in your usual mucus.  You have a stiff neck.  You have changes in your:  Vision.  Hearing.  Thinking.  Mood. MAKE SURE YOU:   Understand these instructions.  Will watch your condition.  Will get help right away if you are not doing well or get worse.   This information is not intended to replace advice given to you by your health care provider. Make sure you discuss any questions you have with your health care provider.   Document Released: 06/15/2000 Document Revised: 05/06/2014 Document Reviewed: 03/27/2013 Elsevier Interactive Patient Education 2016 [employer]  Cough, Adult A cough helps to clear your throat and lungs. A cough may last only 2-3 weeks (acute), or it may last longer than 8 weeks (chronic). Many different things can cause a cough. A cough may be a sign of an illness or another medical condition. HOME CARE  Pay attention to any changes in your cough.  Take medicines only as told by your doctor.  If you were prescribed an antibiotic medicine, take it as told by your doctor. Do not stop taking it even if you start to feel better.  Talk with your doctor before you try using a cough medicine.  Drink enough fluid to keep your pee (urine) clear or  pale yellow.  If the air is dry, use a cold steam vaporizer or humidifier in your home.  Stay away from things that make you cough at work or at home.  If your cough is worse at night, try using extra pillows to raise your head up higher while you sleep.  Do not smoke, and try not to be around smoke. If you need help quitting, ask your doctor.  Do not have caffeine.  Do not drink alcohol.  Rest as needed. GET HELP IF:  You have new problems (symptoms).  You cough up yellow fluid (pus).  Your cough does not get better after 2-3 weeks, or your cough gets worse.  Medicine does not help your cough and you are not sleeping well.  You have pain that gets worse or pain that is not helped with medicine.  You have a fever.  You are losing weight and you  do not know why.  You have night sweats. GET HELP RIGHT AWAY IF:  You cough up blood.  You have trouble breathing.  Your heartbeat is very fast.   This information is not intended to replace advice given to you by your health care provider. Make sure you discuss any questions you have with your health care provider.   Document Released: 09/02/2010 Document Revised: 09/10/2014 Document Reviewed: 02/26/2014 Elsevier Interactive Patient Education [employer].

## 2014-12-20 NOTE — ED Provider Notes (Signed)
CSN: [id number]     Arrival date & time 12/20/14  1301 History   First MD Initiated Contact with Patient 12/20/14 1319     Chief Complaint  Patient presents with  . URI   (Consider location/radiation/quality/duration/timing/severity/associated sxs/prior Treatment) HPI Comments: 64 year old female complaining of cold symptoms between 1 and 2 weeks. She has a history of asthma. Her symptoms include cough, nasal discharge, nasal stuffiness, PND, occasional intermittent sore throat. Symptoms are worse at night. She is using her albuterol 3 times a day. Denies fever or chills. In the exam room she is constantly sniffing and clearing her throat. She is looking at a magazine during the interview and exam.   Past Medical History  Diagnosis Date  . Hypertension   . Asthma   . Gout     "used to; haven't had it lately" (02/04/2013)  . PONV (postoperative nausea and vomiting)   . Chronic bronchitis ([place])     "usually get it q yr" (02/04/2013)  . Type II diabetes mellitus ([place])   . GERD (gastroesophageal reflux disease)   . Arthritis     "left leg" (02/04/2013)  . Symptomatic bradycardia     s/p STJ dual chamber pacemaker by Dr Lovena Le 02/2013  . Allergic rhinitis    Past Surgical History  Procedure Laterality Date  . Ganglion cyst excision Right 2003    Ganglion of wrist, volar and distal radius  . Pacemaker insertion  02/04/2013    STJ [employer] dual chamber pacemaker implanted by Dr Lovena Le for symptomatic bradycardia  . Abdominal hysterectomy  ?1980's  . Dilation and curettage of uterus    . Tubal ligation    . Breast biopsy Right     "benign"  . Breast lumpectomy Right     "benign"  . Permanent pacemaker insertion N/A 02/04/2013    Procedure: PERMANENT PACEMAKER INSERTION;  Surgeon: Evans Lance, MD;  Location: [facility] CATH LAB;  Service: Cardiovascular;  Laterality: N/A;   Family History  Problem Relation Age of Onset  . Emphysema Father   . Breast cancer Mother    Social History   Substance Use Topics  . Smoking status: Never Smoker   . Smokeless tobacco: Never Used  . Alcohol Use: 0.0 oz/week    0 Standard drinks or equivalent per week     Comment: 1 rarely    OB History    No data available     Review of Systems  Constitutional: Positive for activity change. Negative for fever, chills, appetite change and fatigue.  HENT: Positive for congestion, postnasal drip and rhinorrhea. Negative for facial swelling and trouble swallowing.   Eyes: Positive for redness.  Respiratory: Positive for cough and shortness of breath.   Cardiovascular: Negative.   Gastrointestinal: Negative.   Musculoskeletal: Negative for neck pain and neck stiffness.  Skin: Negative for pallor and rash.  Neurological: Negative.     Allergies  Moxifloxacin and Penicillins  Home Medications   Prior to Admission medications   Medication Sig Start Date End Date Taking? Authorizing Provider  albuterol (PROVENTIL HFA;VENTOLIN HFA) 108 (90 BASE) MCG/ACT inhaler Inhale 1-2 puffs into the lungs every 4 (four) hours as needed for wheezing or shortness of breath.   Yes Historical Provider, MD  amLODipine (NORVASC) 10 MG tablet Take 10 mg by mouth daily.   Yes Historical Provider, MD  aspirin EC 81 MG tablet Take 81 mg by mouth daily.   Yes Historical Provider, MD  celecoxib (CELEBREX) 100 MG capsule Take 100 mg  by mouth 2 (two) times daily.   Yes Historical Provider, MD  fluticasone (FLONASE) 50 MCG/ACT nasal spray Place 2 sprays into both nostrils daily. 11/19/14  Yes Collene Gobble, MD  Fluticasone-Salmeterol (ADVAIR) 250-50 MCG/DOSE AEPB Inhale 1 puff into the lungs 2 (two) times daily.   Yes Historical Provider, MD  hydrochlorothiazide (HYDRODIURIL) 25 MG tablet Take 12.5 mg by mouth daily.    Yes Historical Provider, MD  losartan (COZAAR) 100 MG tablet Take 100 mg by mouth daily.   Yes Historical Provider, MD  metFORMIN (GLUCOPHAGE-XR) 500 MG 24 hr tablet Take 500 mg by mouth daily with  breakfast.   Yes Historical Provider, MD  omeprazole (PRILOSEC) 20 MG capsule Take 1 capsule (20 mg total) by mouth 2 (two) times daily before a meal. 06/24/14  Yes Collene Gobble, MD  ipratropium (ATROVENT) 0.06 % nasal spray Place 2 sprays into both nostrils 4 (four) times daily. Prn nasal discharge 12/20/14   Janne Napoleon, NP  nystatin (MYCOSTATIN) 100000 UNIT/ML suspension Take 5 mLs (500,000 Units total) by mouth 4 (four) times daily as needed (mouth sores). 02/22/14   Melony Overly, MD   Meds Ordered and Administered this Visit  Medications - No data to display  BP 179/94 mmHg  Pulse 64  Temp(Src) 97.8 F (36.6 C) (Oral)  Resp 20  SpO2 99% No data found.   Physical Exam  Constitutional: She is oriented to person, place, and time. She appears well-developed and well-nourished. No distress.  HENT:  Bilateral TMs are normal Oropharynx with minor erythema, cobblestoning and moderate clear PND. No exudates or swelling.  Eyes:  Minor conjunctival erythema. No drainage.  Neck: Normal range of motion. Neck supple.  Cardiovascular: Normal rate, regular rhythm and normal heart sounds.   Pulmonary/Chest: Effort normal and breath sounds normal. No respiratory distress. She has no wheezes. She has no rales.  Lungs are perfectly clear. No coarseness or wheezing. Good expansion and air movement.  Musculoskeletal: Normal range of motion. She exhibits no edema.  Lymphadenopathy:    She has no cervical adenopathy.  Neurological: She is alert and oriented to person, place, and time.  Skin: Skin is warm and dry. No rash noted.  Psychiatric: She has a normal mood and affect.  Nursing note and vitals reviewed.   ED Course  Procedures (including critical care time)  Labs Review Labs Reviewed - No data to display  Imaging Review No results found.   Visual Acuity Review  Right Eye Distance:   Left Eye Distance:   Bilateral Distance:    Right Eye Near:   Left Eye Near:    Bilateral  Near:         MDM   1. URI (upper respiratory infection)   2. Sinus drainage    For drainage may use Allegra, Claritin or Zyrtec. At nighttime, if needed for drainage may use Chlor-Trimeton 2 mg. This may cause drowsiness. Use saline nasal spray frequently. Atrovent nasal spray as directed. Drink plan fluids and stay well-hydrated     Janne Napoleon, NP 12/20/14 1346

## 2014-12-20 NOTE — ED Notes (Signed)
C/o cold sx onset 2 weeks Sx today include prod cough, wheezing, SOB, congestion, runny nose and bilateral red eyes A&O x4 .... No acute distress.

## 2015-02-04 ENCOUNTER — Ambulatory Visit (INDEPENDENT_AMBULATORY_CARE_PROVIDER_SITE_OTHER): Payer: Medicare Other | Admitting: Internal Medicine

## 2015-02-04 ENCOUNTER — Encounter: Payer: Self-pay | Admitting: Internal Medicine

## 2015-02-04 VITALS — BP 148/100 | HR 60 | Ht 61.5 in | Wt 207.2 lb

## 2015-02-04 DIAGNOSIS — I442 Atrioventricular block, complete: Secondary | ICD-10-CM

## 2015-02-04 LAB — CUP PACEART INCLINIC DEVICE CHECK
Battery Remaining Longevity: 129.6
Brady Statistic RV Percent Paced: 99.38 %
Date Time Interrogation Session: 20170201100124
Implantable Lead Implant Date: 20150202
Implantable Lead Location: 753859
Lead Channel Impedance Value: 525 Ohm
Lead Channel Pacing Threshold Amplitude: 0.75 V
Lead Channel Pacing Threshold Amplitude: 0.75 V
Lead Channel Pacing Threshold Pulse Width: 0.4 ms
Lead Channel Sensing Intrinsic Amplitude: 5 mV
Lead Channel Setting Pacing Amplitude: 1 V
Lead Channel Setting Pacing Amplitude: 2 V
Lead Channel Setting Pacing Pulse Width: 0.4 ms
Lead Channel Setting Sensing Sensitivity: 2 mV
MDC IDC LEAD IMPLANT DT: 20150202
MDC IDC LEAD LOCATION: 753860
MDC IDC MSMT BATTERY VOLTAGE: 3.01 V
MDC IDC MSMT LEADCHNL RA IMPEDANCE VALUE: 487.5 Ohm
MDC IDC MSMT LEADCHNL RA PACING THRESHOLD PULSEWIDTH: 0.4 ms
MDC IDC PG SERIAL: 7588973
MDC IDC STAT BRADY RA PERCENT PACED: 18 %
Pulse Gen Model: 2240

## 2015-02-04 NOTE — Progress Notes (Signed)
HPI Mrs. Jessica Carpenter returns today for followup. She is a pleasant 65 yo woman with a h/o HTN who developed CHB, and is s/p PPM insertion. In the interim, she has done well. No fever or chills. She has been stressed out taking care of great grandchildren. She is not exercising much. Allergies  Allergen Reactions  . Moxifloxacin Shortness Of Breath and Other (See Comments)    Caused syncope and throat tightness  . Penicillins Hives, Shortness Of Breath and Other (See Comments)    Caused syncope and throat tightness     Current Outpatient Prescriptions  Medication Sig Dispense Refill  . albuterol (PROVENTIL HFA;VENTOLIN HFA) 108 (90 BASE) MCG/ACT inhaler Inhale 1-2 puffs into the lungs every 4 (four) hours as needed for wheezing or shortness of breath.    Marland Kitchen amLODipine (NORVASC) 10 MG tablet Take 10 mg by mouth daily.    Marland Kitchen aspirin EC 81 MG tablet Take 81 mg by mouth daily.    . celecoxib (CELEBREX) 100 MG capsule Take 100 mg by mouth 2 (two) times daily.    . fluticasone (FLONASE) 50 MCG/ACT nasal spray Place 2 sprays into both nostrils daily. 48 g 1  . Fluticasone-Salmeterol (ADVAIR) 250-50 MCG/DOSE AEPB Inhale 1 puff into the lungs 2 (two) times daily.    Marland Kitchen glipiZIDE (GLUCOTROL XL) 10 MG 24 hr tablet Take 20 mg by mouth daily with breakfast.  3  . hydrochlorothiazide (HYDRODIURIL) 25 MG tablet Take 25 mg by mouth daily.     Marland Kitchen ipratropium (ATROVENT) 0.06 % nasal spray Place 2 sprays into both nostrils 4 (four) times daily. Prn nasal discharge 15 mL 12  . losartan (COZAAR) 100 MG tablet Take 100 mg by mouth daily.    . metFORMIN (GLUCOPHAGE-XR) 500 MG 24 hr tablet Take 500 mg by mouth daily with breakfast.    . nystatin (MYCOSTATIN) 100000 UNIT/ML suspension Take 5 mLs (500,000 Units total) by mouth 4 (four) times daily as needed (mouth sores). 180 mL 0  . omeprazole (PRILOSEC) 20 MG capsule Take 1 capsule (20 mg total) by mouth 2 (two) times daily before a meal. 30 capsule 5   No  current facility-administered medications for this visit.     Past Medical History  Diagnosis Date  . Hypertension   . Asthma   . Gout     "used to; haven't had it lately" (02/04/2013)  . PONV (postoperative nausea and vomiting)   . Chronic bronchitis ([place])     "usually get it q yr" (02/04/2013)  . Type II diabetes mellitus ([place])   . GERD (gastroesophageal reflux disease)   . Arthritis     "left leg" (02/04/2013)  . Symptomatic bradycardia     s/p STJ dual chamber pacemaker by Dr Lovena Le 02/2013  . Allergic rhinitis     ROS:   All systems reviewed and negative except as noted in the HPI.   Past Surgical History  Procedure Laterality Date  . Ganglion cyst excision Right 2003    Ganglion of wrist, volar and distal radius  . Pacemaker insertion  02/04/2013    STJ Assurity dual chamber pacemaker implanted by Dr Lovena Le for symptomatic bradycardia  . Abdominal hysterectomy  ?1980's  . Dilation and curettage of uterus    . Tubal ligation    . Breast biopsy Right     "benign"  . Breast lumpectomy Right     "benign"  . Permanent pacemaker insertion N/A 02/04/2013    Procedure: PERMANENT PACEMAKER  INSERTION;  Surgeon: Evans Lance, MD;  Location: [facility] CATH LAB;  Service: Cardiovascular;  Laterality: N/A;     Family History  Problem Relation Age of Onset  . Emphysema Father   . Breast cancer Mother      Social History   Social History  . Marital Status: Divorced    Spouse Name: N/A  . Number of Children: N/A  . Years of Education: N/A   Occupational History  . disabled     Social History Main Topics  . Smoking status: Never Smoker   . Smokeless tobacco: Never Used  . Alcohol Use: 0.0 oz/week    0 Standard drinks or equivalent per week     Comment: 1 rarely   . Drug Use: No  . Sexual Activity: Not on file   Other Topics Concern  . Not on file   Social History Narrative     BP 148/100 mmHg  Pulse 60  Ht 5' 1.5" (1.562 m)  Wt 207 lb 3.2 oz (93.985 kg)  BMI  38.52 kg/m2  Physical Exam:  Well appearing middle aged woman, NAD HEENT: Unremarkable Neck:  6 cm JVD, no thyromegally Back:  No CVA tenderness Lungs:  Clear with no wheezes HEART:  Regular rate rhythm, no murmurs, no rubs, no clicks Abd:  soft, positive bowel sounds, no organomegally, no rebound, no guarding Ext:  2 plus pulses, no edema, no cyanosis, no clubbing Skin:  No rashes no nodules Neuro:  CN II through XII intact, motor grossly intact   DEVICE  Normal device function.  See PaceArt for details.   Assess/Plan: 1. Complete heart block - she is doing well, s/p PPM insertion.  2. Pacemaker - her DDD PM is working normally. Will recheck in several months. 3. HTN - her blood pressure is elevated today but she notes she took her meds just prior to coming in today. 4. Obesity - we discussed the importance of weight loss and exercise.  Mikle Bosworth.D.

## 2015-02-04 NOTE — Patient Instructions (Signed)
Medication Instructions:  Your physician recommends that you continue on your current medications as directed. Please refer to the Current Medication list given to you today.   Labwork: None ordered   Testing/Procedures: None ordered   Follow-Up: Your physician wants you to follow-up in: 12 months with Dr Knox Saliva will receive a reminder letter in the mail two months in advance. If you don't receive a letter, please call our office to schedule the follow-up appointment.   Remote monitoring is used to monitor your Pacemaker  from home. This monitoring reduces the number of office visits required to check your device to one time per year. It allows [place] to keep an eye on the functioning of your device to ensure it is working properly. You are scheduled for a device check from home on 05/06/15. You may send your transmission at any time that day. If you have a wireless device, the transmission will be sent automatically. After your physician reviews your transmission, you will receive a postcard with your next transmission date.    Any Other Special Instructions Will Be Listed Below (If Applicable).     If you need a refill on your cardiac medications before your next appointment, please call your pharmacy.

## 2015-05-06 ENCOUNTER — Ambulatory Visit (INDEPENDENT_AMBULATORY_CARE_PROVIDER_SITE_OTHER): Payer: Medicare Other | Admitting: *Deleted

## 2015-05-06 DIAGNOSIS — I442 Atrioventricular block, complete: Secondary | ICD-10-CM | POA: Diagnosis not present

## 2015-05-07 NOTE — Progress Notes (Signed)
Remote pacemaker transmission.   

## 2015-05-26 ENCOUNTER — Other Ambulatory Visit: Payer: Self-pay | Admitting: Primary Care

## 2015-05-26 DIAGNOSIS — R5381 Other malaise: Secondary | ICD-10-CM

## 2015-06-12 ENCOUNTER — Encounter: Payer: Self-pay | Admitting: Cardiology

## 2015-06-16 LAB — CUP PACEART REMOTE DEVICE CHECK
Battery Remaining Longevity: 128 mo
Battery Remaining Percentage: 95.5 %
Battery Voltage: 3.01 V
Brady Statistic AP VP Percent: 16 %
Brady Statistic AP VS Percent: 1 %
Brady Statistic AS VS Percent: 1 %
Brady Statistic RV Percent Paced: 99 %
Implantable Lead Implant Date: 20150202
Implantable Lead Location: 753859
Lead Channel Impedance Value: 460 Ohm
Lead Channel Impedance Value: 490 Ohm
Lead Channel Pacing Threshold Amplitude: 0.625 V
Lead Channel Pacing Threshold Amplitude: 0.75 V
Lead Channel Pacing Threshold Pulse Width: 0.4 ms
Lead Channel Pacing Threshold Pulse Width: 0.4 ms
Lead Channel Sensing Intrinsic Amplitude: 4.8 mV
Lead Channel Setting Pacing Amplitude: 0.875
Lead Channel Setting Sensing Sensitivity: 2 mV
MDC IDC LEAD IMPLANT DT: 20150202
MDC IDC LEAD LOCATION: 753860
MDC IDC MSMT LEADCHNL RV SENSING INTR AMPL: 4 mV
MDC IDC SESS DTM: 20170503080018
MDC IDC SET LEADCHNL RA PACING AMPLITUDE: 2 V
MDC IDC SET LEADCHNL RV PACING PULSEWIDTH: 0.4 ms
MDC IDC STAT BRADY AS VP PERCENT: 83 %
MDC IDC STAT BRADY RA PERCENT PACED: 16 %
Pulse Gen Serial Number: 7588973

## 2015-08-05 ENCOUNTER — Ambulatory Visit (INDEPENDENT_AMBULATORY_CARE_PROVIDER_SITE_OTHER): Payer: Medicare Other | Admitting: *Deleted

## 2015-08-05 DIAGNOSIS — I442 Atrioventricular block, complete: Secondary | ICD-10-CM | POA: Diagnosis not present

## 2015-08-05 NOTE — Progress Notes (Signed)
Remote pacemaker transmission.   

## 2015-08-12 ENCOUNTER — Encounter: Payer: Self-pay | Admitting: Cardiology

## 2015-08-22 ENCOUNTER — Emergency Department (HOSPITAL_COMMUNITY)
Admission: EM | Admit: 2015-08-22 | Discharge: 2015-08-22 | Disposition: A | Discharge status: Home / Self Care | Payer: Medicare Other | Attending: Emergency Medicine | Admitting: Emergency Medicine

## 2015-08-22 ENCOUNTER — Encounter (HOSPITAL_COMMUNITY): Payer: Self-pay | Admitting: *Deleted

## 2015-08-22 DIAGNOSIS — Z950 Presence of cardiac pacemaker: Secondary | ICD-10-CM | POA: Diagnosis not present

## 2015-08-22 DIAGNOSIS — R1012 Left upper quadrant pain: Secondary | ICD-10-CM | POA: Diagnosis present

## 2015-08-22 DIAGNOSIS — J45909 Unspecified asthma, uncomplicated: Secondary | ICD-10-CM | POA: Diagnosis not present

## 2015-08-22 DIAGNOSIS — I110 Hypertensive heart disease with heart failure: Secondary | ICD-10-CM | POA: Diagnosis not present

## 2015-08-22 DIAGNOSIS — B3741 Candidal cystitis and urethritis: Secondary | ICD-10-CM

## 2015-08-22 DIAGNOSIS — Z79899 Other long term (current) drug therapy: Secondary | ICD-10-CM | POA: Diagnosis not present

## 2015-08-22 DIAGNOSIS — Z7982 Long term (current) use of aspirin: Secondary | ICD-10-CM | POA: Diagnosis not present

## 2015-08-22 DIAGNOSIS — E119 Type 2 diabetes mellitus without complications: Secondary | ICD-10-CM | POA: Insufficient documentation

## 2015-08-22 DIAGNOSIS — I509 Heart failure, unspecified: Secondary | ICD-10-CM | POA: Diagnosis not present

## 2015-08-22 DIAGNOSIS — N3090 Cystitis, unspecified without hematuria: Secondary | ICD-10-CM | POA: Diagnosis not present

## 2015-08-22 DIAGNOSIS — Z7984 Long term (current) use of oral hypoglycemic drugs: Secondary | ICD-10-CM | POA: Insufficient documentation

## 2015-08-22 DIAGNOSIS — Z202 Contact with and (suspected) exposure to infections with a predominantly sexual mode of transmission: Secondary | ICD-10-CM | POA: Insufficient documentation

## 2015-08-22 LAB — CBC WITH DIFFERENTIAL/PLATELET
Basophils Absolute: 0 10*3/uL (ref 0.0–0.1)
Basophils Relative: 0 %
Eosinophils Absolute: 0.2 10*3/uL (ref 0.0–0.7)
Eosinophils Relative: 3 %
HCT: 44.9 % (ref 36.0–46.0)
Hemoglobin: 14.3 g/dL (ref 12.0–15.0)
Lymphocytes Relative: 48 %
Lymphs Abs: 3.2 10*3/uL (ref 0.7–4.0)
MCH: 27.5 pg (ref 26.0–34.0)
MCHC: 31.8 g/dL (ref 30.0–36.0)
MCV: 86.3 fL (ref 78.0–100.0)
Monocytes Absolute: 0.5 10*3/uL (ref 0.1–1.0)
Monocytes Relative: 7 %
Neutro Abs: 2.9 10*3/uL (ref 1.7–7.7)
Neutrophils Relative %: 42 %
Platelets: 367 10*3/uL (ref 150–400)
RBC: 5.2 MIL/uL — AB (ref 3.87–5.11)
RDW: 14 % (ref 11.5–15.5)
WBC: 6.9 10*3/uL (ref 4.0–10.5)

## 2015-08-22 LAB — COMPREHENSIVE METABOLIC PANEL
ALT: 19 U/L (ref 14–54)
ANION GAP: 9 (ref 5–15)
AST: 17 U/L (ref 15–41)
Albumin: 3.9 g/dL (ref 3.5–5.0)
Alkaline Phosphatase: 86 U/L (ref 38–126)
BUN: 14 mg/dL (ref 6–20)
CHLORIDE: 106 mmol/L (ref 101–111)
CO2: 24 mmol/L (ref 22–32)
Calcium: 9.5 mg/dL (ref 8.9–10.3)
Creatinine, Ser: 1.01 mg/dL — ABNORMAL HIGH (ref 0.44–1.00)
GFR calc non Af Amer: 57 mL/min — ABNORMAL LOW (ref >60)
Glucose, Bld: 116 mg/dL — ABNORMAL HIGH (ref 65–99)
POTASSIUM: 3.8 mmol/L (ref 3.5–5.1)
SODIUM: 139 mmol/L (ref 135–145)
Total Bilirubin: 0.5 mg/dL (ref 0.3–1.2)
Total Protein: 7.6 g/dL (ref 6.5–8.1)

## 2015-08-22 LAB — URINALYSIS, ROUTINE W REFLEX MICROSCOPIC
Bilirubin Urine: NEGATIVE
Glucose, UA: NEGATIVE mg/dL
Hgb urine dipstick: NEGATIVE
Ketones, ur: NEGATIVE mg/dL
NITRITE: NEGATIVE
PH: 5.5 (ref 5.0–8.0)
Protein, ur: NEGATIVE mg/dL
Specific Gravity, Urine: 1.017 (ref 1.005–1.030)

## 2015-08-22 LAB — URINE MICROSCOPIC-ADD ON: RBC / HPF: NONE SEEN RBC/hpf (ref 0–5)

## 2015-08-22 LAB — RPR: RPR Ser Ql: NONREACTIVE

## 2015-08-22 LAB — LIPASE, BLOOD: LIPASE: 21 U/L (ref 11–51)

## 2015-08-22 MED ORDER — DOXYCYCLINE HYCLATE 100 MG PO CAPS: 100.0000 mg | ORAL_CAPSULE | Freq: Two times a day (BID) | ORAL | 0 refills | Status: AC

## 2015-08-22 MED ORDER — FLUCONAZOLE 200 MG PO TABS: 200.0000 mg | ORAL_TABLET | Freq: Every day | ORAL | 0 refills | Status: AC

## 2015-08-22 MED ORDER — GI COCKTAIL ~~LOC~~
30.0000 mL | Freq: Once | ORAL | Status: AC
  Administered 2015-08-22: 30 mL via ORAL
  Filled 2015-08-22: qty 30

## 2015-08-22 NOTE — ED Notes (Signed)
Pt ambulates to room with steady gait

## 2015-08-22 NOTE — ED Triage Notes (Signed)
Pt c/o R mid abd pain onset x 2 wks, pt states, "I may have a UTI because I have been having pressure down there. My friend I go with had Syphilis, but we have been using condoms." pt denies urinary frequency, dysuria, & hematuria, pt denies vaginal discharge, A&O x4

## 2015-08-22 NOTE — ED Provider Notes (Signed)
[facility] DEPT Provider Note   CSN: [id number] Arrival date & time: 08/22/15  [id number]     History   Chief Complaint Chief Complaint  Patient presents with  . Abdominal Pain    HPI Jessica Carpenter is a 65 y.o. female.  HPI  65 year old female presents with concern of 2 weeks of left upper, and left middle abdominal pain. Reports that it is worsened by eating. Denies any radiation of pain. Denies nausea, vomiting, diarrhea, significant constipation. Pain is 5/10, dull ache.  Has no history of similar pain. The pain does come in waves.  Also reports concern of urinary pressure for 1 week.  Reports that her friend, whom she is sexually active with using condoms, had a skin lesion, and was found to have syphilis. Reports he tested negative for other sexual transmitted infections. Patient is concerned regarding a possible syphilis exposure, and would like empiric treatment if possible. No skin lesions, no vaginal discharge or other concerns.   Past Medical History:  Diagnosis Date  . Allergic rhinitis   . Arthritis    "left leg" (02/04/2013)  . Asthma   . Chronic bronchitis ([place])    "usually get it q yr" (02/04/2013)  . GERD (gastroesophageal reflux disease)   . Gout    "used to; haven't had it lately" (02/04/2013)  . Hypertension   . PONV (postoperative nausea and vomiting)   . Symptomatic bradycardia    s/p STJ dual chamber pacemaker by Dr Lovena Le 02/2013  . Type II diabetes mellitus [facility])     Patient Active Problem List   Diagnosis Date Noted  . Pacemaker 05/07/2013  . Atrioventricular block, complete ([place]) 02/04/2013  . Complete heart block ([place]) 01/31/2013  . CHF (congestive heart failure) ([place]) 01/31/2013  . ACUTE BRONCHITIS 02/10/2009  . SINUSITIS, ACUTE 06/17/2008  . CANDIDIASIS, ORAL 03/14/2008  . 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 Essential hypertension 09/15/2006  . ALLERGIC RHINITIS 09/15/2006  . COCAINE ABUSE, HX OF 09/15/1998    Past Surgical History:    Procedure Laterality Date  . ABDOMINAL HYSTERECTOMY  ?1980's  . BREAST BIOPSY Right    "benign"  . BREAST LUMPECTOMY Right    "benign"  . DILATION AND CURETTAGE OF UTERUS    . GANGLION CYST EXCISION Right 2003   Ganglion of wrist, volar and distal radius  . PACEMAKER INSERTION  02/04/2013   STJ Assurity dual chamber pacemaker implanted by Dr Lovena Le for symptomatic bradycardia  . PERMANENT PACEMAKER INSERTION N/A 02/04/2013   Procedure: PERMANENT PACEMAKER INSERTION;  Surgeon: Evans Lance, MD;  Location[facility] CATH LAB;  Service: Cardiovascular;  Laterality: N/A;  . TUBAL LIGATION      OB History    No data available       Home Medications    Prior to Admission medications   Medication Sig Start Date End Date Taking? Authorizing Provider  albuterol (PROVENTIL HFA;VENTOLIN HFA) 108 (90 BASE) MCG/ACT inhaler Inhale 1-2 puffs into the lungs every 4 (four) hours as needed for wheezing or shortness of breath.    Historical Provider, MD  amLODipine (NORVASC) 10 MG tablet Take 10 mg by mouth daily.    Historical Provider, MD  aspirin EC 81 MG tablet Take 81 mg by mouth daily.    Historical Provider, MD  celecoxib (CELEBREX) 100 MG capsule Take 100 mg by mouth 2 (two) times daily.    Historical Provider, MD  doxycycline (VIBRAMYCIN) 100 MG capsule Take 1 capsule (100 mg total) by mouth 2 (two) times  daily. 08/22/15 09/05/15  Gareth Morgan, MD  fluconazole (DIFLUCAN) 200 MG tablet Take 1 tablet (200 mg total) by mouth daily. 08/22/15 09/05/15  Gareth Morgan, MD  fluticasone (FLONASE) 50 MCG/ACT nasal spray Place 2 sprays into both nostrils daily. 11/19/14   Collene Gobble, MD  Fluticasone-Salmeterol (ADVAIR) 250-50 MCG/DOSE AEPB Inhale 1 puff into the lungs 2 (two) times daily.    Historical Provider, MD  glipiZIDE (GLUCOTROL XL) 10 MG 24 hr tablet Take 20 mg by mouth daily with breakfast. 11/18/14   Historical Provider, MD  hydrochlorothiazide (HYDRODIURIL) 25 MG tablet Take 25 mg by mouth  daily.     Historical Provider, MD  ipratropium (ATROVENT) 0.06 % nasal spray Place 2 sprays into both nostrils 4 (four) times daily. Prn nasal discharge 12/20/14   Janne Napoleon, NP  losartan (COZAAR) 100 MG tablet Take 100 mg by mouth daily.    Historical Provider, MD  metFORMIN (GLUCOPHAGE-XR) 500 MG 24 hr tablet Take 500 mg by mouth daily with breakfast.    Historical Provider, MD  nystatin (MYCOSTATIN) 100000 UNIT/ML suspension Take 5 mLs (500,000 Units total) by mouth 4 (four) times daily as needed (mouth sores). 02/22/14   Melony Overly, MD  omeprazole (PRILOSEC) 20 MG capsule Take 1 capsule (20 mg total) by mouth 2 (two) times daily before a meal. 06/24/14   Collene Gobble, MD    Family History Family History  Problem Relation Age of Onset  . Emphysema Father   . Breast cancer Mother     Social History Social History  Substance Use Topics  . Smoking status: Never Smoker  . Smokeless tobacco: Never Used  . Alcohol use 0.0 oz/week     Comment: 1 rarely      Allergies   Moxifloxacin and Penicillins   Review of Systems Review of Systems  Constitutional: Negative for fever.  HENT: Negative for sore throat.   Eyes: Negative for visual disturbance.  Respiratory: Negative for cough and shortness of breath.   Cardiovascular: Negative for chest pain.  Gastrointestinal: Positive for abdominal pain. Negative for constipation, diarrhea, nausea and vomiting.  Genitourinary: Positive for difficulty urinating (pressure like pain with urinating) and urgency. Negative for dysuria.  Musculoskeletal: Positive for back pain. Negative for neck pain.  Skin: Negative for rash.  Neurological: Negative for syncope and headaches.     Physical Exam Updated Vital Signs BP 116/58   Pulse (!) 59   Temp 97.7 F (36.5 C) (Oral)   Resp 18   Ht 5' 4.5" (1.638 m)   Wt 202 lb (91.6 kg)   SpO2 98%   BMI 34.14 kg/m   Physical Exam  Constitutional: She is oriented to person, place, and time. She  appears well-developed and well-nourished. No distress.  HENT:  Head: Normocephalic and atraumatic.  Eyes: Conjunctivae and EOM are normal.  Neck: Normal range of motion.  Cardiovascular: Normal rate, regular rhythm, normal heart sounds and intact distal pulses.  Exam reveals no gallop and no friction rub.   No murmur heard. Pulmonary/Chest: Effort normal and breath sounds normal. No respiratory distress. She has no wheezes. She has no rales.  Abdominal: Soft. She exhibits no distension. There is tenderness (mild LUQ, left middle abdomen, no LLQ tenderness, no guarding). There is no guarding.  No CVA tenderness  Musculoskeletal: She exhibits no edema or tenderness.  Neurological: She is alert and oriented to person, place, and time.  Skin: Skin is warm and dry. No rash noted. She is not  diaphoretic. No erythema.  Nursing note and vitals reviewed.    ED Treatments / Results  Labs (all labs ordered are listed, but only abnormal results are displayed) Labs Reviewed  URINALYSIS, ROUTINE W REFLEX MICROSCOPIC (NOT AT [facility]) - Abnormal; Notable for the following:       Result Value   APPearance CLOUDY (*)    Leukocytes, UA LARGE (*)    All other components within normal limits  CBC WITH DIFFERENTIAL/PLATELET - Abnormal; Notable for the following:    RBC 5.20 (*)    All other components within normal limits  COMPREHENSIVE METABOLIC PANEL - Abnormal; Notable for the following:    Glucose, Bld 116 (*)    Creatinine, Ser 1.01 (*)    GFR calc non Af Amer 57 (*)    All other components within normal limits  URINE MICROSCOPIC-ADD ON - Abnormal; Notable for the following:    Squamous Epithelial / LPF 6-30 (*)    Bacteria, UA RARE (*)    All other components within normal limits  LIPASE, BLOOD  RPR    EKG  EKG Interpretation None       Radiology No results found.  Procedures Procedures (including critical care time)  Medications Ordered in ED Medications  gi cocktail  (Maalox,Lidocaine,Donnatal) (30 mLs Oral Given 08/22/15 [id number])     Initial Impression / Assessment and Plan / ED Course  I have reviewed the triage vital signs and the nursing notes.  Pertinent labs & imaging results that were available during my care of the patient were reviewed by me and considered in my medical decision making (see chart for details).  Clinical Course   65 year old female DM, htn presents with concern for left sided abdominal pain, urinary pressure, and possible exposure to syphilis.  Patient with benign abdominal exam, history, and physical exam are not consistent with diverticulitis, perforation, AAA, nephrolithiasis. Labs show no sign of pancreatitis or other acute abnormalities. Urine shows possible urinary tract infection and yeast.  Given symptoms, we'll treat with 2 weeks of daily fluconazole. Patient will also be placed on doxycycline for 2 weeks for possible syphilis exposure, and penicillin allergy. Pt declines further testing for gc/chl/hiv. Urine culture will be sent for further speciation. Recommend antacid medicine for possible gastritis. Recommend close PCP follow up.    Final Clinical Impressions(s) / ED Diagnoses   Final diagnoses:  Left upper quadrant pain  Exposure to syphilis, possible  Yeast cystitis    New Prescriptions New Prescriptions   DOXYCYCLINE (VIBRAMYCIN) 100 MG CAPSULE    Take 1 capsule (100 mg total) by mouth 2 (two) times daily.   FLUCONAZOLE (DIFLUCAN) 200 MG TABLET    Take 1 tablet (200 mg total) by mouth daily.     Gareth Morgan, MD 08/22/15 [id number]

## 2015-08-22 NOTE — ED Notes (Signed)
Pt oob to br with steady gait 

## 2015-08-22 NOTE — ED Notes (Signed)
Pt states she understands instructions. Home stable with steady gait. 

## 2015-08-23 LAB — URINE CULTURE

## 2015-08-25 LAB — CUP PACEART REMOTE DEVICE CHECK
Brady Statistic AS VP Percent: 79 %
Brady Statistic AS VS Percent: 1 %
Brady Statistic RA Percent Paced: 21 %
Brady Statistic RV Percent Paced: 99 %
Date Time Interrogation Session: 20170802080015
Implantable Lead Location: 753860
Lead Channel Pacing Threshold Pulse Width: 0.4 ms
Lead Channel Pacing Threshold Pulse Width: 0.4 ms
Lead Channel Sensing Intrinsic Amplitude: 4.5 mV
Lead Channel Sensing Intrinsic Amplitude: 4.9 mV
Lead Channel Setting Pacing Amplitude: 2 V
Lead Channel Setting Sensing Sensitivity: 2 mV
MDC IDC LEAD IMPLANT DT: 20150202
MDC IDC LEAD IMPLANT DT: 20150202
MDC IDC LEAD LOCATION: 753859
MDC IDC MSMT BATTERY REMAINING LONGEVITY: 129 mo
MDC IDC MSMT BATTERY REMAINING PERCENTAGE: 95.5 %
MDC IDC MSMT BATTERY VOLTAGE: 3.01 V
MDC IDC MSMT LEADCHNL RA IMPEDANCE VALUE: 480 Ohm
MDC IDC MSMT LEADCHNL RA PACING THRESHOLD AMPLITUDE: 0.75 V
MDC IDC MSMT LEADCHNL RV IMPEDANCE VALUE: 530 Ohm
MDC IDC MSMT LEADCHNL RV PACING THRESHOLD AMPLITUDE: 0.75 V
MDC IDC SET LEADCHNL RV PACING AMPLITUDE: 1 V
MDC IDC SET LEADCHNL RV PACING PULSEWIDTH: 0.4 ms
MDC IDC STAT BRADY AP VP PERCENT: 21 %
MDC IDC STAT BRADY AP VS PERCENT: 1 %
Pulse Gen Model: 2240
Pulse Gen Serial Number: 7588973

## 2015-09-26 ENCOUNTER — Emergency Department (HOSPITAL_COMMUNITY)
Admission: EM | Admit: 2015-09-26 | Discharge: 2015-09-26 | Disposition: A | Discharge status: Home / Self Care | Payer: Medicare Other | Attending: Emergency Medicine | Admitting: Emergency Medicine

## 2015-09-26 ENCOUNTER — Encounter (HOSPITAL_COMMUNITY): Payer: Self-pay

## 2015-09-26 DIAGNOSIS — R1012 Left upper quadrant pain: Secondary | ICD-10-CM | POA: Diagnosis present

## 2015-09-26 DIAGNOSIS — I509 Heart failure, unspecified: Secondary | ICD-10-CM | POA: Insufficient documentation

## 2015-09-26 DIAGNOSIS — K219 Gastro-esophageal reflux disease without esophagitis: Secondary | ICD-10-CM | POA: Insufficient documentation

## 2015-09-26 DIAGNOSIS — Z950 Presence of cardiac pacemaker: Secondary | ICD-10-CM | POA: Diagnosis not present

## 2015-09-26 DIAGNOSIS — Z7984 Long term (current) use of oral hypoglycemic drugs: Secondary | ICD-10-CM | POA: Diagnosis not present

## 2015-09-26 DIAGNOSIS — J45909 Unspecified asthma, uncomplicated: Secondary | ICD-10-CM | POA: Insufficient documentation

## 2015-09-26 DIAGNOSIS — E119 Type 2 diabetes mellitus without complications: Secondary | ICD-10-CM | POA: Insufficient documentation

## 2015-09-26 DIAGNOSIS — N12 Tubulo-interstitial nephritis, not specified as acute or chronic: Secondary | ICD-10-CM | POA: Insufficient documentation

## 2015-09-26 DIAGNOSIS — Z7982 Long term (current) use of aspirin: Secondary | ICD-10-CM | POA: Diagnosis not present

## 2015-09-26 DIAGNOSIS — I110 Hypertensive heart disease with heart failure: Secondary | ICD-10-CM | POA: Insufficient documentation

## 2015-09-26 LAB — CBC WITH DIFFERENTIAL/PLATELET
BASOS ABS: 0 10*3/uL (ref 0.0–0.1)
BASOS PCT: 1 %
EOS ABS: 0.1 10*3/uL (ref 0.0–0.7)
Eosinophils Relative: 1 %
HCT: 44.8 % (ref 36.0–46.0)
HEMOGLOBIN: 14.7 g/dL (ref 12.0–15.0)
LYMPHS ABS: 2.9 10*3/uL (ref 0.7–4.0)
Lymphocytes Relative: 41 %
MCH: 27.8 pg (ref 26.0–34.0)
MCHC: 32.8 g/dL (ref 30.0–36.0)
MCV: 84.8 fL (ref 78.0–100.0)
Monocytes Absolute: 0.6 10*3/uL (ref 0.1–1.0)
Monocytes Relative: 9 %
NEUTROS PCT: 48 %
Neutro Abs: 3.3 10*3/uL (ref 1.7–7.7)
PLATELETS: 260 10*3/uL (ref 150–400)
RBC: 5.28 MIL/uL — AB (ref 3.87–5.11)
RDW: 14 % (ref 11.5–15.5)
WBC: 7 10*3/uL (ref 4.0–10.5)

## 2015-09-26 LAB — URINALYSIS, ROUTINE W REFLEX MICROSCOPIC
BILIRUBIN URINE: NEGATIVE
Bilirubin Urine: NEGATIVE
GLUCOSE, UA: NEGATIVE mg/dL
Glucose, UA: NEGATIVE mg/dL
HGB URINE DIPSTICK: NEGATIVE
KETONES UR: NEGATIVE mg/dL
Ketones, ur: NEGATIVE mg/dL
NITRITE: NEGATIVE
Nitrite: NEGATIVE
PROTEIN: NEGATIVE mg/dL
Protein, ur: NEGATIVE mg/dL
Specific Gravity, Urine: 1.018 (ref 1.005–1.030)
Specific Gravity, Urine: 1.014 (ref 1.005–1.030)
pH: 5.5 (ref 5.0–8.0)
pH: 6 (ref 5.0–8.0)

## 2015-09-26 LAB — COMPREHENSIVE METABOLIC PANEL
ALBUMIN: 3.7 g/dL (ref 3.5–5.0)
ALK PHOS: 88 U/L (ref 38–126)
ALT: 17 U/L (ref 14–54)
AST: 19 U/L (ref 15–41)
Anion gap: 16 — ABNORMAL HIGH (ref 5–15)
BUN: 18 mg/dL (ref 6–20)
CALCIUM: 9.9 mg/dL (ref 8.9–10.3)
CHLORIDE: 105 mmol/L (ref 101–111)
CO2: 23 mmol/L (ref 22–32)
CREATININE: 1.2 mg/dL — AB (ref 0.44–1.00)
GFR calc Af Amer: 53 mL/min — ABNORMAL LOW (ref >60)
GFR calc non Af Amer: 46 mL/min — ABNORMAL LOW (ref >60)
GLUCOSE: 132 mg/dL — AB (ref 65–99)
Potassium: 4 mmol/L (ref 3.5–5.1)
SODIUM: 144 mmol/L (ref 135–145)
Total Bilirubin: 0.5 mg/dL (ref 0.3–1.2)
Total Protein: 7.7 g/dL (ref 6.5–8.1)

## 2015-09-26 LAB — URINE MICROSCOPIC-ADD ON

## 2015-09-26 LAB — CBG MONITORING, ED: GLUCOSE-CAPILLARY: 141 mg/dL — AB (ref 65–99)

## 2015-09-26 LAB — LIPASE, BLOOD: Lipase: 19 U/L (ref 11–51)

## 2015-09-26 MED ORDER — SULFAMETHOXAZOLE-TRIMETHOPRIM 800-160 MG PO TABS: 1.0000 | ORAL_TABLET | Freq: Two times a day (BID) | ORAL | 0 refills | Status: DC

## 2015-09-26 MED ORDER — GI COCKTAIL ~~LOC~~
30.0000 mL | Freq: Once | ORAL | Status: AC
  Administered 2015-09-26: 30 mL via ORAL
  Filled 2015-09-26: qty 30

## 2015-09-26 MED ORDER — ONDANSETRON 4 MG PO TBDP
4.0000 mg | ORAL_TABLET | Freq: Once | ORAL | Status: AC
  Administered 2015-09-26: 4 mg via ORAL
  Filled 2015-09-26: qty 1

## 2015-09-26 MED ORDER — FAMOTIDINE 20 MG PO TABS: 20.0000 mg | ORAL_TABLET | Freq: Every day | ORAL | 0 refills | Status: DC

## 2015-09-26 MED ORDER — SULFAMETHOXAZOLE-TRIMETHOPRIM 800-160 MG PO TABS
1.0000 | ORAL_TABLET | Freq: Once | ORAL | Status: AC
  Administered 2015-09-26: 1 via ORAL
  Filled 2015-09-26: qty 1

## 2015-09-26 MED ORDER — FOSFOMYCIN TROMETHAMINE 3 G PO PACK
3.0000 g | PACK | Freq: Once | ORAL | Status: DC
  Filled 2015-09-26: qty 3

## 2015-09-26 MED ORDER — FOSFOMYCIN TROMETHAMINE 3 G PO PACK
3.0000 g | PACK | Freq: Once | ORAL | Status: AC
  Administered 2015-09-26: 3 g via ORAL
  Filled 2015-09-26: qty 3

## 2015-09-26 NOTE — ED Provider Notes (Signed)
[facility] DEPT Provider Note   CSN: [id number] Arrival date & time: 09/26/15  [id number]     History   Chief Complaint Chief Complaint  Patient presents with  . Dysuria    HPI  Blood pressure 155/86, pulse (!) 59, temperature 97.5 F (36.4 C), temperature source Oral, resp. rate 16, height 5\' 4"  (1.626 m), weight 88.5 kg, SpO2 100 %.  Jessica Carpenter is a 65 y.o. 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 She does have some lower abdominal pressure when she urinates. Patient was recently treated for UTI and vaginal yeast infection states that she had about 20 days of doxycycline. She completed the course. She was seen one month ago. At that time she was tested for syphilis because she had concern that a gentleman that she had protected sex with had a rash and she was concerned about syphilis. Chart review shows that the RPR was negative. Patient denies any flank pain, vaginal discharge.  HPI  Past Medical History:  Diagnosis Date  . Allergic rhinitis   . Arthritis    "left leg" (02/04/2013)  . Asthma   . Chronic bronchitis ([place])    "usually get it q yr" (02/04/2013)  . GERD (gastroesophageal reflux disease)   . Gout    "used to; haven't had it lately" (02/04/2013)  . Hypertension   . PONV (postoperative nausea and vomiting)   . Symptomatic bradycardia    s/p STJ dual chamber pacemaker by Dr Lovena Le 02/2013  . Type II diabetes mellitus [facility])     Patient Active Problem List   Diagnosis Date Noted  . Pacemaker 05/07/2013  . 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 Complete heart block ([place]) 01/31/2013  . CHF (congestive heart failure) ([place]) 01/31/2013  . ACUTE BRONCHITIS  02/10/2009  . SINUSITIS, ACUTE 06/17/2008  . CANDIDIASIS, ORAL 03/14/2008  . 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 Essential hypertension 09/15/2006  . ALLERGIC RHINITIS 09/15/2006  . COCAINE ABUSE, HX OF 09/15/1998    Past Surgical History:  Procedure Laterality Date  . ABDOMINAL HYSTERECTOMY  ?1980's  . BREAST BIOPSY Right    "benign"  . BREAST LUMPECTOMY Right    "benign"  . DILATION AND CURETTAGE OF UTERUS    . GANGLION CYST EXCISION Right 2003   Ganglion of wrist, volar and distal radius  . PACEMAKER INSERTION  02/04/2013   STJ Assurity dual chamber pacemaker implanted by Dr Lovena Le for symptomatic bradycardia  . PERMANENT PACEMAKER INSERTION N/A 02/04/2013   Procedure: PERMANENT PACEMAKER INSERTION;  Surgeon: Evans Lance, MD;  Location: [facility] CATH LAB;  Service: Cardiovascular;  Laterality: N/A;  . TUBAL LIGATION      OB History    No data available       Home Medications    Prior to Admission medications   Medication Sig Start Date End Date Taking? Authorizing Provider  albuterol (PROVENTIL HFA;VENTOLIN HFA) 108 (90 BASE) MCG/ACT inhaler Inhale 1-2 puffs into the lungs every 4 (four) hours as needed for wheezing or shortness of breath.   Yes Historical Provider, MD  amLODipine (NORVASC) 10 MG tablet Take 10 mg by mouth daily.   Yes Historical Provider, MD  aspirin EC 81 MG tablet Take 81 mg by mouth  daily.   Yes Historical Provider, MD  beclomethasone (QVAR) 80 MCG/ACT inhaler Inhale 2 puffs into the lungs 2 (two) times daily.   Yes Historical Provider, MD  celecoxib (CELEBREX) 100 MG capsule Take 100 mg by mouth 2 (two) times daily as needed (pain).    Yes Historical Provider, MD  glipiZIDE (GLUCOTROL XL) 10 MG 24 hr tablet Take 20 mg by mouth daily with breakfast. 11/18/14  Yes Historical Provider, MD  hydrochlorothiazide (HYDRODIURIL) 25 MG tablet Take 25 mg by mouth daily.    Yes Historical Provider, MD  losartan (COZAAR) 100 MG tablet Take 100 mg by mouth daily.   Yes  Historical Provider, MD  metformin (FORTAMET) 1000 MG (OSM) 24 hr tablet Take 1,000 mg by mouth 2 (two) times daily. 07/30/15  Yes Historical Provider, MD  Polyethyl Glycol-Propyl Glycol (SYSTANE OP) Place 1 drop into both eyes daily as needed (dry eyes).   Yes Historical Provider, MD  famotidine (PEPCID) 20 MG tablet Take 1 tablet (20 mg total) by mouth daily. 09/26/15   Gabriellia Rempel, PA-C  fluticasone (FLONASE) 50 MCG/ACT nasal spray Place 2 sprays into both nostrils daily. Patient not taking: Reported on 09/26/2015 11/19/14   Collene Gobble, MD  ipratropium (ATROVENT) 0.06 % nasal spray Place 2 sprays into both nostrils 4 (four) times daily. Prn nasal discharge Patient not taking: Reported on 09/26/2015 12/20/14   Janne Napoleon, NP  metFORMIN (GLUCOPHAGE-XR) 500 MG 24 hr tablet Take 500 mg by mouth daily with breakfast.    Historical Provider, MD  nystatin (MYCOSTATIN) 100000 UNIT/ML suspension Take 5 mLs (500,000 Units total) by mouth 4 (four) times daily as needed (mouth sores). Patient not taking: Reported on 09/26/2015 02/22/14   Melony Overly, MD  omeprazole (PRILOSEC) 20 MG capsule Take 1 capsule (20 mg total) by mouth 2 (two) times daily before a meal. Patient not taking: Reported on 09/26/2015 06/24/14   Collene Gobble, MD  sulfamethoxazole-trimethoprim (BACTRIM DS) 800-160 MG tablet Take 1 tablet by mouth 2 (two) times daily. 09/26/15   Elmyra Ricks Justyn Boyson, PA-C    Family History Family History  Problem Relation Age of Onset  . Emphysema Father   . Breast cancer Mother     Social History Social History  Substance Use Topics  . Smoking status: Never Smoker  . Smokeless tobacco: Never Used  . Alcohol use 0.0 oz/week     Comment: 1 rarely      Allergies   Moxifloxacin and Penicillins   Review of Systems Review of Systems  10 systems reviewed and found to be negative, except as noted in the HPI.   Physical Exam Updated Vital Signs BP 112/62   Pulse 60   Temp 97.5 F (36.4  C) (Oral)   Resp 16   Ht 5\' 4"  (1.626 m)   Wt 88.5 kg   SpO2 95%   BMI 33.49 kg/m   Physical Exam  Constitutional: She is oriented to person, place, and time. She appears well-developed and well-nourished. No distress.  HENT:  Head: Normocephalic.  Mouth/Throat: Oropharynx is clear and moist.  Eyes: Conjunctivae and EOM are normal.  Neck: Normal range of motion.  Cardiovascular: Normal rate.   Pulmonary/Chest: Effort normal. No stridor.  Abdominal: Soft. Bowel sounds are normal. She exhibits no distension and no mass. There is no tenderness. There is no rebound and no guarding.  Genitourinary:  Genitourinary Comments: Trace left-sided CVA tenderness to percussion.  Musculoskeletal: Normal range of motion.  Neurological: She is alert  and oriented to person, place, and time.  Psychiatric: She has a normal mood and affect.  Nursing note and vitals reviewed.    ED Treatments / Results  Labs (all labs ordered are listed, but only abnormal results are displayed) Labs Reviewed  URINALYSIS, ROUTINE W REFLEX MICROSCOPIC (NOT AT [facility]) - Abnormal; Notable for the following:       Result Value   APPearance TURBID (*)    Hgb urine dipstick SMALL (*)    Leukocytes, UA LARGE (*)    All other components within normal limits  CBC WITH DIFFERENTIAL/PLATELET - Abnormal; Notable for the following:    RBC 5.28 (*)    All other components within normal limits  COMPREHENSIVE METABOLIC PANEL - Abnormal; Notable for the following:    Glucose, Bld 132 (*)    Creatinine, Ser 1.20 (*)    GFR calc non Af Amer 46 (*)    GFR calc Af Amer 53 (*)    Anion gap 16 (*)    All other components within normal limits  URINE MICROSCOPIC-ADD ON - Abnormal; Notable for the following:    Squamous Epithelial / LPF TOO NUMEROUS TO COUNT (*)    Bacteria, UA MANY (*)    All other components within normal limits  URINALYSIS, ROUTINE W REFLEX MICROSCOPIC (NOT AT [facility]) - Abnormal; Notable for the following:     Leukocytes, UA LARGE (*)    All other components within normal limits  URINE MICROSCOPIC-ADD ON - Abnormal; Notable for the following:    Squamous Epithelial / LPF 6-30 (*)    Bacteria, UA FEW (*)    All other components within normal limits  CBG MONITORING, ED - Abnormal; Notable for the following:    Glucose-Capillary 141 (*)    All other components within normal limits  URINE CULTURE  LIPASE, BLOOD  RPR  HIV ANTIBODY (ROUTINE TESTING)    EKG  EKG Interpretation None       Radiology No results found.  Procedures Procedures (including critical care time)  Medications Ordered in ED Medications  gi cocktail (Maalox,Lidocaine,Donnatal) (30 mLs Oral Given 09/26/15 1010)  ondansetron (ZOFRAN-ODT) disintegrating tablet 4 mg (4 mg Oral Given 09/26/15 1011)  sulfamethoxazole-trimethoprim (BACTRIM DS,SEPTRA DS) 800-160 MG per tablet 1 tablet (1 tablet Oral Given 09/26/15 1039)  fosfomycin (MONUROL) packet 3 g (3 g Oral Given 09/26/15 1039)     Initial Impression / Assessment and Plan / ED Course  I have reviewed the triage vital signs and the nursing notes.  Pertinent labs & imaging results that were available during my care of the patient were reviewed by me and considered in my medical decision making (see chart for details).  Clinical Course     Vitals:   09/26/15 1115 09/26/15 1145 09/26/15 1215 09/26/15 1245  BP: 123/57 111/63 127/73 112/62  Pulse: 60 60 60 60  Resp:      Temp:      TempSrc:      SpO2: 97% 94% 93% 95%  Weight:      Height:        Medications  gi cocktail (Maalox,Lidocaine,Donnatal) (30 mLs Oral Given 09/26/15 1010)  ondansetron (ZOFRAN-ODT) disintegrating tablet 4 mg (4 mg Oral Given 09/26/15 1011)  sulfamethoxazole-trimethoprim (BACTRIM DS,SEPTRA DS) 800-160 MG per tablet 1 tablet (1 tablet Oral Given 09/26/15 1039)  fosfomycin (MONUROL) packet 3 g (3 g Oral Given 09/26/15 1039)    Jessica Carpenter is 64 y.o. female presenting with Asst. left  upper quadrant  pain and pressure with urination onset one month ago, patient was treated with 20 days of doxycycline and she states that her symptoms have returned, the left upper quadrant pain never really left her. She has nausea but no emesis, no signs of systemic infection. Mild left CVA tenderness to percussion. Urine culture with multiple species from one month ago. Unfortunately, this patient has anaphylactic reaction to moxifloxacin and penicillins. Given the restriction on his antibiotic use will give fosfomycin and Bactrim.  Will check basic blood work. Urine culture pending.  Attempted to get a clean-catch urine,, it does have squamous cells however it is cleaner than the first sample. Infection. Patient will be treated with Bactrim and fosfomycin. Patient significant improvement in symptoms after GI cocktail, she has a history of gastric ulcer, we'll treat for GERD with Pepcid, encouraged her to follow closely with primary care physician.  This is a shared visit with the attending physician who personally evaluated the patient and agrees with the care plan.    Evaluation does not show pathology that would require ongoing emergent intervention or inpatient treatment. Pt is hemodynamically stable and mentating appropriately. Discussed findings and plan with patient/guardian, who agrees with care plan. All questions answered. Return precautions discussed and outpatient follow up given.      Final Clinical Impressions(s) / ED Diagnoses   Final diagnoses:  Pyelonephritis  Gastroesophageal reflux disease, esophagitis presence not specified    New Prescriptions Discharge Medication List as of 09/26/2015  2:00 PM    START taking these medications   Details  famotidine (PEPCID) 20 MG tablet Take 1 tablet (20 mg total) by mouth daily., Starting Sat 09/26/2015, Print    sulfamethoxazole-trimethoprim (BACTRIM DS) 800-160 MG tablet Take 1 tablet by mouth 2 (two) times daily., Starting Sat  09/26/2015, [employer], PA-C 09/26/15 [place], MD 09/26/15 [phone]

## 2015-09-26 NOTE — ED Triage Notes (Signed)
Patient complains of dysuria and bladder pain for the past few days, seen several days ago in ED for same and diagnosed with uti. Thinks she didn't have enough medication. NAD

## 2015-09-26 NOTE — ED Notes (Signed)
Pt is in stable condition upon d/c and ambulates from ED. 

## 2015-09-26 NOTE — ED Notes (Signed)
PA at bedside.

## 2015-09-26 NOTE — Discharge Instructions (Signed)
Please follow with your primary care doctor in the next 2 days for a check-up. They must obtain records for further management.  ° °Do not hesitate to return to the Emergency Department for any new, worsening or concerning symptoms.  ° °

## 2015-09-27 LAB — URINE CULTURE: Culture: NO GROWTH

## 2015-09-27 LAB — HIV ANTIBODY (ROUTINE TESTING W REFLEX): HIV SCREEN 4TH GENERATION: NONREACTIVE

## 2015-09-27 LAB — RPR: RPR Ser Ql: NONREACTIVE

## 2015-11-04 ENCOUNTER — Ambulatory Visit (INDEPENDENT_AMBULATORY_CARE_PROVIDER_SITE_OTHER): Payer: Medicare Other | Admitting: *Deleted

## 2015-11-04 DIAGNOSIS — I442 Atrioventricular block, complete: Secondary | ICD-10-CM

## 2015-11-04 NOTE — Progress Notes (Signed)
Remote pacemaker transmission.   

## 2015-11-11 ENCOUNTER — Encounter: Payer: Self-pay | Admitting: Cardiology

## 2015-12-16 LAB — CUP PACEART REMOTE DEVICE CHECK
Battery Remaining Percentage: 95.5 %
Battery Voltage: 3.01 V
Brady Statistic AS VS Percent: 1 %
Brady Statistic RA Percent Paced: 27 %
Date Time Interrogation Session: 20171101080021
Implantable Lead Implant Date: 20150202
Implantable Lead Implant Date: 20150202
Implantable Lead Location: 753860
Implantable Pulse Generator Implant Date: 20150202
Lead Channel Impedance Value: 490 Ohm
Lead Channel Pacing Threshold Amplitude: 0.75 V
Lead Channel Pacing Threshold Pulse Width: 0.4 ms
Lead Channel Pacing Threshold Pulse Width: 0.4 ms
Lead Channel Sensing Intrinsic Amplitude: 5 mV
Lead Channel Setting Pacing Amplitude: 0.875
Lead Channel Setting Sensing Sensitivity: 2 mV
MDC IDC LEAD LOCATION: 753859
MDC IDC MSMT BATTERY REMAINING LONGEVITY: 127 mo
MDC IDC MSMT LEADCHNL RA IMPEDANCE VALUE: 480 Ohm
MDC IDC MSMT LEADCHNL RV PACING THRESHOLD AMPLITUDE: 0.625 V
MDC IDC MSMT LEADCHNL RV SENSING INTR AMPL: 7.9 mV
MDC IDC PG SERIAL: 7588973
MDC IDC SET LEADCHNL RA PACING AMPLITUDE: 2 V
MDC IDC SET LEADCHNL RV PACING PULSEWIDTH: 0.4 ms
MDC IDC STAT BRADY AP VP PERCENT: 27 %
MDC IDC STAT BRADY AP VS PERCENT: 1 %
MDC IDC STAT BRADY AS VP PERCENT: 72 %
MDC IDC STAT BRADY RV PERCENT PACED: 99 %

## 2016-02-08 ENCOUNTER — Ambulatory Visit (INDEPENDENT_AMBULATORY_CARE_PROVIDER_SITE_OTHER): Payer: Medicare Other | Admitting: Internal Medicine

## 2016-02-08 ENCOUNTER — Encounter: Payer: Self-pay | Admitting: Internal Medicine

## 2016-02-08 VITALS — BP 130/76 | HR 61 | Ht 64.0 in | Wt 191.2 lb

## 2016-02-08 DIAGNOSIS — I442 Atrioventricular block, complete: Secondary | ICD-10-CM | POA: Diagnosis not present

## 2016-02-08 DIAGNOSIS — Z950 Presence of cardiac pacemaker: Secondary | ICD-10-CM

## 2016-02-08 LAB — CUP PACEART INCLINIC DEVICE CHECK
Brady Statistic RA Percent Paced: 31 %
Brady Statistic RV Percent Paced: 99.9 %
Implantable Lead Implant Date: 20150202
Lead Channel Impedance Value: 462.5 Ohm
Lead Channel Pacing Threshold Amplitude: 0.75 V
Lead Channel Pacing Threshold Amplitude: 0.75 V
Lead Channel Pacing Threshold Pulse Width: 0.4 ms
Lead Channel Pacing Threshold Pulse Width: 0.4 ms
Lead Channel Sensing Intrinsic Amplitude: 5 mV
MDC IDC LEAD IMPLANT DT: 20150202
MDC IDC LEAD LOCATION: 753859
MDC IDC LEAD LOCATION: 753860
MDC IDC MSMT BATTERY VOLTAGE: 3.01 V
MDC IDC MSMT LEADCHNL RA PACING THRESHOLD PULSEWIDTH: 0.4 ms
MDC IDC MSMT LEADCHNL RV IMPEDANCE VALUE: 487.5 Ohm
MDC IDC MSMT LEADCHNL RV PACING THRESHOLD AMPLITUDE: 0.75 V
MDC IDC MSMT LEADCHNL RV PACING THRESHOLD AMPLITUDE: 0.75 V
MDC IDC MSMT LEADCHNL RV PACING THRESHOLD PULSEWIDTH: 0.4 ms
MDC IDC PG IMPLANT DT: 20150202
MDC IDC PG SERIAL: 7588973
MDC IDC SESS DTM: 20180205094532
MDC IDC SET LEADCHNL RA PACING AMPLITUDE: 2 V
MDC IDC SET LEADCHNL RV PACING AMPLITUDE: 1 V
MDC IDC SET LEADCHNL RV PACING PULSEWIDTH: 0.4 ms
MDC IDC SET LEADCHNL RV SENSING SENSITIVITY: 2 mV

## 2016-02-08 MED ORDER — OMEPRAZOLE 20 MG PO CPDR: 20.0000 mg | DELAYED_RELEASE_CAPSULE | Freq: Two times a day (BID) | ORAL | 3 refills | Status: DC

## 2016-02-08 MED ORDER — OMEPRAZOLE 20 MG PO CPDR
20.0000 mg | DELAYED_RELEASE_CAPSULE | Freq: Two times a day (BID) | ORAL | 5 refills | Status: DC
Start: 2016-02-08 — End: 2016-02-08

## 2016-02-08 NOTE — Progress Notes (Addendum)
HPI Jessica Carpenter returns today for followup. She is a pleasant 66 yo woman with a h/o HTN who developed CHB, and is s/p PPM insertion. In the interim, she has done well. No fever or chills. She has been stressed out taking care of great grandchildren. She has lost almost 20 lbs since I saw her last. Allergies  Allergen Reactions  . Moxifloxacin Hives, Shortness Of Breath and Other (See Comments)    Caused syncope and throat tightness  . Penicillins Hives, Shortness Of Breath and Other (See Comments)    Caused syncope and throat tightness Has patient had a PCN reaction causing immediate rash, facial/tongue/throat swelling, SOB or lightheadedness with hypotension: Yes Has patient had a PCN reaction causing severe rash involving mucus membranes or skin necrosis: No Has patient had a PCN reaction that required hospitalization: hospital visit but not overnight Has patient had a PCN reaction occurring within the last 10 years: No If all of the above answers are "NO", then may proceed with Cephalosporin     Current Outpatient Prescriptions  Medication Sig Dispense Refill  . albuterol (PROVENTIL HFA;VENTOLIN HFA) 108 (90 BASE) MCG/ACT inhaler Inhale 1-2 puffs into the lungs every 4 (four) hours as needed for wheezing or shortness of breath.    Marland Kitchen amLODipine (NORVASC) 10 MG tablet Take 10 mg by mouth daily.    Marland Kitchen aspirin EC 81 MG tablet Take 81 mg by mouth daily.    . beclomethasone (QVAR) 80 MCG/ACT inhaler Inhale 2 puffs into the lungs 2 (two) times daily.    . celecoxib (CELEBREX) 100 MG capsule Take 100 mg by mouth 2 (two) times daily as needed (pain).     . famotidine (PEPCID) 20 MG tablet Take 1 tablet (20 mg total) by mouth daily. 30 tablet 0  . fluticasone (FLONASE) 50 MCG/ACT nasal spray Place 2 sprays into both nostrils daily. 48 g 1  . glipiZIDE (GLUCOTROL XL) 10 MG 24 hr tablet Take 20 mg by mouth daily with breakfast.  3  . hydrochlorothiazide (HYDRODIURIL) 25 MG tablet Take 25  mg by mouth daily.     Marland Kitchen ipratropium (ATROVENT) 0.06 % nasal spray Place 2 sprays into both nostrils 4 (four) times daily. Prn nasal discharge 15 mL 12  . losartan (COZAAR) 100 MG tablet Take 100 mg by mouth daily.    . metformin (FORTAMET) 1000 MG (OSM) 24 hr tablet Take 1,000 mg by mouth 2 (two) times daily.    . metFORMIN (GLUCOPHAGE-XR) 500 MG 24 hr tablet Take 500 mg by mouth daily with breakfast.    . nystatin (MYCOSTATIN) 100000 UNIT/ML suspension Take 5 mLs (500,000 Units total) by mouth 4 (four) times daily as needed (mouth sores). 180 mL 0  . omeprazole (PRILOSEC) 20 MG capsule Take 1 capsule (20 mg total) by mouth 2 (two) times daily before a meal. 30 capsule 5  . Polyethyl Glycol-Propyl Glycol (SYSTANE OP) Place 1 drop into both eyes daily as needed (dry eyes).    Marland Kitchen sulfamethoxazole-trimethoprim (BACTRIM DS) 800-160 MG tablet Take 1 tablet by mouth 2 (two) times daily. 28 tablet 0   No current facility-administered medications for this visit.      Past Medical History:  Diagnosis Date  . Allergic rhinitis   . Arthritis    "left leg" (02/04/2013)  . Asthma   . Chronic bronchitis ([place])    "usually get it q yr" (02/04/2013)  . GERD (gastroesophageal reflux disease)   . Gout    "  used to; haven't had it lately" (02/04/2013)  . Hypertension   . PONV (postoperative nausea and vomiting)   . Symptomatic bradycardia    s/p STJ dual chamber pacemaker by Dr Lovena Le 02/2013  . Type II diabetes mellitus ([facility])     ROS:   All systems reviewed and negative except as noted in the HPI.   Past Surgical History:  Procedure Laterality Date  . ABDOMINAL HYSTERECTOMY  ?1980's  . BREAST BIOPSY Right    "benign"  . BREAST LUMPECTOMY Right    "benign"  . DILATION AND CURETTAGE OF UTERUS    . GANGLION CYST EXCISION Right 2003   Ganglion of wrist, volar and distal radius  . PACEMAKER INSERTION  02/04/2013   STJ Assurity dual chamber pacemaker implanted by Dr Lovena Le for symptomatic bradycardia    . PERMANENT PACEMAKER INSERTION N/A 02/04/2013   Procedure: PERMANENT PACEMAKER INSERTION;  Surgeon: Evans Lance, MD;  Location: [facility] CATH LAB;  Service: Cardiovascular;  Laterality: N/A;  . TUBAL LIGATION       Family History  Problem Relation Age of Onset  . Emphysema Father   . Breast cancer Mother      Social History   Social History  . Marital status: Divorced    Spouse name: N/A  . Number of children: N/A  . Years of education: N/A   Occupational History  . disabled     Social History Main Topics  . Smoking status: Never Smoker  . Smokeless tobacco: Never Used  . Alcohol use 0.0 oz/week     Comment: 1 rarely   . Drug use: No  . Sexual activity: Not on file   Other Topics Concern  . Not on file   Social History Narrative  . No narrative on file     BP 130/76   Pulse 61   Ht 5\' 4"  (1.626 m)   Wt 191 lb 3.2 oz (86.7 kg)   SpO2 99%   BMI 32.82 kg/m   Physical Exam:  Well appearing 66 yo woman, NAD HEENT: Unremarkable Neck:  6 cm JVD, no thyromegally Back:  No CVA tenderness Lungs:  Clear with no wheezes HEART:  Regular rate rhythm, no murmurs, no rubs, no clicks Abd:  soft, positive bowel sounds, no organomegally, no rebound, no guarding Ext:  2 plus pulses, no edema, no cyanosis, no clubbing Skin:  No rashes no nodules Neuro:  CN II through XII intact, motor grossly intact  ECG - NSR with AV pacing  DEVICE  Normal device function.  See PaceArt for details.   Assess/Plan: 1. Complete heart block - she is doing well, s/p PPM insertion.  2. Pacemaker - her DDD PM is working normally. Will recheck in several months. 3. HTN - her blood pressure is better today. 4. Obesity - we discussed the importance of weight loss and exercise. She plans to continue both over the next year.  Mikle Bosworth.D.

## 2016-02-08 NOTE — Patient Instructions (Addendum)
Medication Instructions:  Your physician recommends that you continue on your current medications as directed. Please refer to the Current Medication list given to you today.   Labwork: None Ordered   Testing/Procedures: None Ordered   Follow-Up: Your physician wants you to follow-up in: 1 year with Dr. Lovena Le.  You will receive a reminder letter in the mail two months in advance. If you don't receive a letter, please call our office to schedule the follow-up appointment.  Remote monitoring is used to monitor your Pacemaker from home. This monitoring reduces the number of office visits required to check your device to one time per year. It allows [place] to keep an eye on the functioning of your device to ensure it is working properly. You are scheduled for a device check from home on 05/09/16. You may send your transmission at any time that day. If you have a wireless device, the transmission will be sent automatically. After your physician reviews your transmission, you will receive a postcard with your next transmission date.     Any Other Special Instructions Will Be Listed Below (If Applicable).     If you need a refill on your cardiac medications before your next appointment, please call your pharmacy.

## 2016-04-23 ENCOUNTER — Encounter (HOSPITAL_COMMUNITY): Payer: Self-pay | Admitting: Emergency Medicine

## 2016-04-23 ENCOUNTER — Ambulatory Visit (HOSPITAL_COMMUNITY)
Admission: EM | Admit: 2016-04-23 | Discharge: 2016-04-23 | Disposition: A | Discharge status: Home / Self Care | Payer: Medicare Other | Attending: Family Medicine | Admitting: Family Medicine

## 2016-04-23 DIAGNOSIS — B370 Candidal stomatitis: Secondary | ICD-10-CM

## 2016-04-23 MED ORDER — FLUCONAZOLE 150 MG PO TABS: 150.0000 mg | ORAL_TABLET | Freq: Once | ORAL | 0 refills | Status: AC

## 2016-04-23 NOTE — ED Provider Notes (Signed)
[place]    CSN: [id number] Arrival date & time: 04/23/16  1202     History   Chief Complaint Chief Complaint  Patient presents with  . Mouth Lesions    HPI Jessica Carpenter is a 66 y.o. female.   This is a 66 year old woman who comes in with dry mouth and soreness which she thinks is secondary to having used her rescue inhalers. She has similar problem a couple years ago and was given nystatin mouthwash.      Past Medical History:  Diagnosis Date  . Allergic rhinitis   . Arthritis    "left leg" (02/04/2013)  . Asthma   . Chronic bronchitis ([place])    "usually get it q yr" (02/04/2013)  . GERD (gastroesophageal reflux disease)   . Gout    "used to; haven't had it lately" (02/04/2013)  . Hypertension   . PONV (postoperative nausea and vomiting)   . Symptomatic bradycardia    s/p STJ dual chamber pacemaker by Dr Lovena Le 02/2013  . Type II diabetes mellitus [facility])     Patient Active Problem List   Diagnosis Date Noted  . Pacemaker 05/07/2013  . 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 CHF (congestive heart failure) ([place]) 01/31/2013  . ACUTE BRONCHITIS 02/10/2009  . SINUSITIS, ACUTE 06/17/2008  . CANDIDIASIS, ORAL 03/14/2008  . 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 Essential hypertension 09/15/2006  . ALLERGIC RHINITIS 09/15/2006  . COCAINE ABUSE, HX OF 09/15/1998    Past Surgical History:  Procedure Laterality Date  . ABDOMINAL HYSTERECTOMY  ?1980's  . BREAST BIOPSY Right    "benign"  . BREAST LUMPECTOMY Right    "benign"  . DILATION AND CURETTAGE OF UTERUS    . GANGLION CYST EXCISION Right 2003   Ganglion of wrist, volar and distal radius  . PACEMAKER INSERTION  02/04/2013   STJ Assurity dual chamber pacemaker implanted by Dr Lovena Le for symptomatic bradycardia  . PERMANENT PACEMAKER INSERTION N/A 02/04/2013   Procedure: PERMANENT PACEMAKER INSERTION;  Surgeon: Evans Lance, MD;  Location: [facility] CATH LAB;   Service: Cardiovascular;  Laterality: N/A;  . TUBAL LIGATION      OB History    No data available       Home Medications    Prior to Admission medications   Medication Sig Start Date End Date Taking? Authorizing Provider  albuterol (PROVENTIL HFA;VENTOLIN HFA) 108 (90 BASE) MCG/ACT inhaler Inhale 1-2 puffs into the lungs every 4 (four) hours as needed for wheezing or shortness of breath.   Yes Historical Provider, MD  amLODipine (NORVASC) 10 MG tablet Take 10 mg by mouth daily.   Yes Historical Provider, MD  aspirin EC 81 MG tablet Take 81 mg by mouth daily.   Yes Historical Provider, MD  beclomethasone (QVAR) 80 MCG/ACT inhaler Inhale 2 puffs into the lungs 2 (two) times daily.   Yes Historical Provider, MD  celecoxib (CELEBREX) 100 MG capsule Take 100 mg by mouth 2 (two) times daily as needed (pain).    Yes Historical Provider, MD  famotidine (PEPCID) 20 MG tablet Take 1 tablet (20 mg total) by mouth daily. 09/26/15  Yes Nicole Pisciotta, PA-C  fluticasone (FLONASE) 50 MCG/ACT nasal spray Place 2 sprays into both nostrils daily. 11/19/14  Yes Collene Gobble, MD  glipiZIDE (GLUCOTROL XL) 10 MG 24 hr tablet Take 20 mg by mouth daily with breakfast. 11/18/14  Yes Historical Provider, MD  hydrochlorothiazide (HYDRODIURIL) 25 MG tablet  Take 25 mg by mouth daily.    Yes Historical Provider, MD  ipratropium (ATROVENT) 0.06 % nasal spray Place 2 sprays into both nostrils 4 (four) times daily. Prn nasal discharge 12/20/14  Yes Janne Napoleon, NP  losartan (COZAAR) 100 MG tablet Take 100 mg by mouth daily.   Yes Historical Provider, MD  metformin (FORTAMET) 1000 MG (OSM) 24 hr tablet Take 1,000 mg by mouth 2 (two) times daily. 07/30/15  Yes Historical Provider, MD  omeprazole (PRILOSEC) 20 MG capsule Take 1 capsule (20 mg total) by mouth 2 (two) times daily before a meal. 02/08/16  Yes Evans Lance, MD  Polyethyl Glycol-Propyl Glycol (SYSTANE OP) Place 1 drop into both eyes daily as needed (dry eyes).    Yes Historical Provider, MD  fluconazole (DIFLUCAN) 150 MG tablet Take 1 tablet (150 mg total) by mouth once. Daily for a week 04/23/16 04/23/16  Robyn Haber, MD  metFORMIN (GLUCOPHAGE-XR) 500 MG 24 hr tablet Take 500 mg by mouth daily with breakfast.    Historical Provider, MD  nystatin (MYCOSTATIN) 100000 UNIT/ML suspension Take 5 mLs (500,000 Units total) by mouth 4 (four) times daily as needed (mouth sores). 02/22/14   Melony Overly, MD    Family History Family History  Problem Relation Age of Onset  . Emphysema Father   . Breast cancer Mother     Social History Social History  Substance Use Topics  . Smoking status: Never Smoker  . Smokeless tobacco: Never Used  . Alcohol use 0.0 oz/week     Comment: 1 rarely      Allergies   Moxifloxacin and Penicillins   Review of Systems Review of Systems  Constitutional: Negative.   HENT: Positive for mouth sores.   Respiratory: Negative.      Physical Exam Triage Vital Signs ED Triage Vitals [04/23/16 1220]  Enc Vitals Group     BP (!) 150/92     Pulse Rate 60     Resp 20     Temp 98 F (36.7 C)     Temp Source Oral     SpO2 100 %     Weight      Height      Head Circumference      Peak Flow      Pain Score      Pain Loc      Pain Edu?      Excl. in [place]?    No data found.   Updated Vital Signs BP (!) 150/92 (BP Location: Left Arm)   Pulse 60   Temp 98 F (36.7 C) (Oral)   Resp 20   SpO2 100%    Physical Exam  Constitutional: She is oriented to person, place, and time. She appears well-developed and well-nourished.  HENT:  Right Ear: External ear normal.  Left Ear: External ear normal.  Mild erythema in the gingiva and tongue  Eyes: Conjunctivae are normal. Pupils are equal, round, and reactive to light.  Neck: Normal range of motion. Neck supple.  Cardiovascular: Normal rate, regular rhythm and normal heart sounds.   Pulmonary/Chest: Effort normal and breath sounds normal.  Musculoskeletal: Normal  range of motion.  Neurological: She is alert and oriented to person, place, and time.  Skin: Skin is warm and dry.  Nursing note and vitals reviewed.    UC Treatments / Results  Labs (all labs ordered are listed, but only abnormal results are displayed) Labs Reviewed - No data to display  EKG  EKG Interpretation None       Radiology No results found.  Procedures Procedures (including critical care time)  Medications Ordered in UC Medications - No data to display   Initial Impression / Assessment and Plan / UC Course  I have reviewed the triage vital signs and the nursing notes.  Pertinent labs & imaging results that were available during my care of the patient were reviewed by me and considered in my medical decision making (see chart for details).     Final Clinical Impressions(s) / UC Diagnoses   Final diagnoses:  Oral thrush    New Prescriptions New Prescriptions   FLUCONAZOLE (DIFLUCAN) 150 MG TABLET    Take 1 tablet (150 mg total) by mouth once. Daily for a week     Robyn Haber, MD 04/23/16 1240

## 2016-04-23 NOTE — ED Triage Notes (Signed)
Pt c/o dry mouth and sore inside mouth onset 2 weeks  Believes it may be to constantly having to use her resuce inhalers  Had similar sx 2 years ago and was given Nystatin   Denies fevers, chills  A&O x4... NAD

## 2016-05-09 ENCOUNTER — Ambulatory Visit (INDEPENDENT_AMBULATORY_CARE_PROVIDER_SITE_OTHER): Payer: Medicare Other | Admitting: *Deleted

## 2016-05-09 DIAGNOSIS — I442 Atrioventricular block, complete: Secondary | ICD-10-CM

## 2016-05-10 NOTE — Progress Notes (Signed)
Remote pacemaker transmission.   

## 2016-05-11 LAB — CUP PACEART REMOTE DEVICE CHECK
Battery Remaining Longevity: 126 mo
Battery Remaining Percentage: 95.5 %
Brady Statistic AP VS Percent: 1 %
Brady Statistic AS VP Percent: 55 %
Brady Statistic AS VS Percent: 1 %
Date Time Interrogation Session: 20180507060034
Implantable Lead Implant Date: 20150202
Implantable Lead Location: 753859
Implantable Lead Location: 753860
Lead Channel Impedance Value: 490 Ohm
Lead Channel Pacing Threshold Amplitude: 0.625 V
Lead Channel Pacing Threshold Pulse Width: 0.4 ms
Lead Channel Sensing Intrinsic Amplitude: 5 mV
Lead Channel Sensing Intrinsic Amplitude: 6.6 mV
Lead Channel Setting Pacing Amplitude: 2 V
Lead Channel Setting Pacing Pulse Width: 0.4 ms
MDC IDC LEAD IMPLANT DT: 20150202
MDC IDC MSMT BATTERY VOLTAGE: 3.01 V
MDC IDC MSMT LEADCHNL RA PACING THRESHOLD AMPLITUDE: 0.75 V
MDC IDC MSMT LEADCHNL RA PACING THRESHOLD PULSEWIDTH: 0.4 ms
MDC IDC MSMT LEADCHNL RV IMPEDANCE VALUE: 490 Ohm
MDC IDC PG IMPLANT DT: 20150202
MDC IDC SET LEADCHNL RV PACING AMPLITUDE: 0.875
MDC IDC SET LEADCHNL RV SENSING SENSITIVITY: 2 mV
MDC IDC STAT BRADY AP VP PERCENT: 45 %
MDC IDC STAT BRADY RA PERCENT PACED: 45 %
MDC IDC STAT BRADY RV PERCENT PACED: 99 %
Pulse Gen Model: 2240
Pulse Gen Serial Number: 7588973

## 2016-05-13 ENCOUNTER — Encounter: Payer: Self-pay | Admitting: Cardiology

## 2016-06-25 ENCOUNTER — Emergency Department (HOSPITAL_COMMUNITY)
Admission: EM | Admit: 2016-06-25 | Discharge: 2016-06-25 | Disposition: A | Discharge status: Home / Self Care | Payer: Medicare Other | Attending: Emergency Medicine | Admitting: Emergency Medicine

## 2016-06-25 ENCOUNTER — Encounter (HOSPITAL_COMMUNITY): Payer: Self-pay | Admitting: *Deleted

## 2016-06-25 DIAGNOSIS — I509 Heart failure, unspecified: Secondary | ICD-10-CM | POA: Diagnosis not present

## 2016-06-25 DIAGNOSIS — J45909 Unspecified asthma, uncomplicated: Secondary | ICD-10-CM | POA: Insufficient documentation

## 2016-06-25 DIAGNOSIS — Z79899 Other long term (current) drug therapy: Secondary | ICD-10-CM | POA: Insufficient documentation

## 2016-06-25 DIAGNOSIS — E119 Type 2 diabetes mellitus without complications: Secondary | ICD-10-CM | POA: Insufficient documentation

## 2016-06-25 DIAGNOSIS — I110 Hypertensive heart disease with heart failure: Secondary | ICD-10-CM | POA: Insufficient documentation

## 2016-06-25 DIAGNOSIS — H9202 Otalgia, left ear: Secondary | ICD-10-CM | POA: Diagnosis present

## 2016-06-25 DIAGNOSIS — H6692 Otitis media, unspecified, left ear: Secondary | ICD-10-CM | POA: Diagnosis not present

## 2016-06-25 DIAGNOSIS — J029 Acute pharyngitis, unspecified: Secondary | ICD-10-CM | POA: Diagnosis not present

## 2016-06-25 DIAGNOSIS — Z7982 Long term (current) use of aspirin: Secondary | ICD-10-CM | POA: Diagnosis not present

## 2016-06-25 DIAGNOSIS — H6690 Otitis media, unspecified, unspecified ear: Secondary | ICD-10-CM

## 2016-06-25 LAB — RAPID STREP SCREEN (MED CTR MEBANE ONLY): STREPTOCOCCUS, GROUP A SCREEN (DIRECT): NEGATIVE

## 2016-06-25 MED ORDER — AZITHROMYCIN 500 MG PO TABS: 500.0000 mg | ORAL_TABLET | Freq: Every day | ORAL | 0 refills | Status: DC

## 2016-06-25 MED ORDER — FLUTICASONE PROPIONATE 50 MCG/ACT NA SUSP: 2.0000 | Freq: Every day | NASAL | 12 refills | Status: DC

## 2016-06-25 NOTE — ED Triage Notes (Signed)
To ED for eval of left ear and throat pain. No redness or swelling noted in throat. No drainage from ear.

## 2016-06-25 NOTE — Discharge Instructions (Signed)
You might have a infection of urine in her left ear. Take the azithromycin once a day for 5 days. I have also given you a prescription for Flonase was helps with allergies and nasal congestion. Warm water gargles with salt free or sore throat. Hot tea with honey will also help. Motrin and Tylenol for pain and fevers. Follow up with her primary care doctor if symptoms are not improving.

## 2016-06-25 NOTE — ED Provider Notes (Signed)
[facility] DEPT Provider Note    By signing my name below, I, Bea Graff, attest that this documentation has been prepared under the direction and in the presence of Ocie Cornfield, PA-C. Electronically Signed: Bea Graff, ED Scribe. 06/25/16. 9:58 AM.    History   Chief Complaint Chief Complaint  Patient presents with  . Otalgia    The history is provided by the patient and medical records. No language interpreter was used.    Jessica Carpenter is a 66 y.o. female who presents to the Emergency Department complaining of sore throat that began two weeks ago. She reports associated left ear pain, dry cough and intermittent congestion. She has not been taking anything for her symptoms. She has multiple grandchildren and one of them may have had similar symptoms unknowingly. There are no modifying factors noted. Pt states that she does have asthma and uses her inhaler. Hx of thrush but states this does not feel that like. She denies fever, chills, nausea, vomiting, neck stiffness, hearing loss, rhinorrhea, ear drainage, difficulty, swallowing or breathing.   Past Medical History:  Diagnosis Date  . Allergic rhinitis   . Arthritis    "left leg" (02/04/2013)  . Asthma   . Chronic bronchitis ([place])    "usually get it q yr" (02/04/2013)  . GERD (gastroesophageal reflux disease)   . Gout    "used to; haven't had it lately" (02/04/2013)  . Hypertension   . PONV (postoperative nausea and vomiting)   . Symptomatic bradycardia    s/p STJ dual chamber pacemaker by Dr Lovena Le 02/2013  . Type II diabetes mellitus [facility])     Patient Active Problem List   Diagnosis Date Noted  . Pacemaker 05/07/2013  . 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 CHF (congestive heart failure) ([place]) 01/31/2013  . ACUTE BRONCHITIS 02/10/2009  . SINUSITIS, ACUTE 06/17/2008  . CANDIDIASIS, ORAL 03/14/2008  . 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 Essential  hypertension 09/15/2006  . ALLERGIC RHINITIS 09/15/2006  . COCAINE ABUSE, HX OF 09/15/1998    Past Surgical History:  Procedure Laterality Date  . ABDOMINAL HYSTERECTOMY  ?1980's  . BREAST BIOPSY Right    "benign"  . BREAST LUMPECTOMY Right    "benign"  . DILATION AND CURETTAGE OF UTERUS    . GANGLION CYST EXCISION Right 2003   Ganglion of wrist, volar and distal radius  . PACEMAKER INSERTION  02/04/2013   STJ Assurity dual chamber pacemaker implanted by Dr Lovena Le for symptomatic bradycardia  . PERMANENT PACEMAKER INSERTION N/A 02/04/2013   Procedure: PERMANENT PACEMAKER INSERTION;  Surgeon: Evans Lance, MD;  Location: [facility] CATH LAB;  Service: Cardiovascular;  Laterality: N/A;  . TUBAL LIGATION      OB History    No data available       Home Medications    Prior to Admission medications   Medication Sig Start Date End Date Taking? Authorizing Provider  albuterol (PROVENTIL HFA;VENTOLIN HFA) 108 (90 BASE) MCG/ACT inhaler Inhale 1-2 puffs into the lungs every 4 (four) hours as needed for wheezing or shortness of breath.    [provider]  amLODipine (NORVASC) 10 MG tablet Take 10 mg by mouth daily.    [provider]  aspirin EC 81 MG tablet Take 81 mg by mouth daily.    [provider]  azithromycin (ZITHROMAX) 500 MG tablet Take 1 tablet (500 mg total) by mouth daily. 06/25/16   Doristine Devoid, PA-C  beclomethasone (  QVAR) 80 MCG/ACT inhaler Inhale 2 puffs into the lungs 2 (two) times daily.    [provider]  celecoxib (CELEBREX) 100 MG capsule Take 100 mg by mouth 2 (two) times daily as needed (pain).     [provider]  famotidine (PEPCID) 20 MG tablet Take 1 tablet (20 mg total) by mouth daily. 09/26/15   Pisciotta, Elmyra Ricks, PA-C  fluticasone (FLONASE) 50 MCG/ACT nasal spray Place 2 sprays into both nostrils daily. 06/25/16   Doristine Devoid, PA-C  glipiZIDE (GLUCOTROL XL) 10 MG 24 hr tablet Take 20 mg by mouth daily with  breakfast. 11/18/14   [provider]  hydrochlorothiazide (HYDRODIURIL) 25 MG tablet Take 25 mg by mouth daily.     [provider]  ipratropium (ATROVENT) 0.06 % nasal spray Place 2 sprays into both nostrils 4 (four) times daily. Prn nasal discharge 12/20/14   Janne Napoleon, NP  losartan (COZAAR) 100 MG tablet Take 100 mg by mouth daily.    [provider]  metformin (FORTAMET) 1000 MG (OSM) 24 hr tablet Take 1,000 mg by mouth 2 (two) times daily. 07/30/15   [provider]  metFORMIN (GLUCOPHAGE-XR) 500 MG 24 hr tablet Take 500 mg by mouth daily with breakfast.    [provider]  nystatin (MYCOSTATIN) 100000 UNIT/ML suspension Take 5 mLs (500,000 Units total) by mouth 4 (four) times daily as needed (mouth sores). 02/22/14   Melony Overly, MD  omeprazole (PRILOSEC) 20 MG capsule Take 1 capsule (20 mg total) by mouth 2 (two) times daily before a meal. 02/08/16   Evans Lance, MD  Polyethyl Glycol-Propyl Glycol (SYSTANE OP) Place 1 drop into both eyes daily as needed (dry eyes).    [provider]    Family History Family History  Problem Relation Age of Onset  . Emphysema Father   . Breast cancer Mother     Social History Social History  Substance Use Topics  . Smoking status: Never Smoker  . Smokeless tobacco: Never Used  . Alcohol use 0.0 oz/week     Comment: 1 rarely      Allergies   Moxifloxacin and Penicillins   Review of Systems Review of Systems  Constitutional: Negative for chills and fever.  HENT: Positive for ear pain and sore throat. Negative for ear discharge and trouble swallowing.   Respiratory: Positive for cough. Negative for shortness of breath.   Gastrointestinal: Negative for nausea and vomiting.     Physical Exam Updated Vital Signs BP 118/76 (BP Location: Left Arm)   Pulse 63   Temp 98.4 F (36.9 C) (Oral)   Resp 18   SpO2 99%   Physical Exam  Constitutional: She is oriented to person, place,  and time. She appears well-developed and well-nourished.  HENT:  Head: Normocephalic and atraumatic.  Right Ear: Hearing, tympanic membrane, external ear and ear canal normal. Tympanic membrane is not erythematous and not retracted.  Left Ear: Hearing, external ear and ear canal normal. Tympanic membrane is not erythematous and not retracted. A middle ear effusion (mild) is present.  Nose: Mucosal edema and rhinorrhea present. Right sinus exhibits no maxillary sinus tenderness and no frontal sinus tenderness. Left sinus exhibits maxillary sinus tenderness. Left sinus exhibits no frontal sinus tenderness.  Mouth/Throat: Uvula is midline and mucous membranes are normal. No trismus in the jaw. No uvula swelling. Posterior oropharyngeal erythema present. No oropharyngeal exudate, posterior oropharyngeal edema or tonsillar abscesses. Tonsils are 1+ on the right. Tonsils are 1+  on the left. No tonsillar exudate.  No white plaques appreciated oropharynx or buccal mucosa.  Eyes: Conjunctivae and EOM are normal. Pupils are equal, round, and reactive to light. Right eye exhibits no discharge. Left eye exhibits no discharge.  Neck: Normal range of motion. Neck supple.  No c spine midline tenderness. No paraspinal tenderness. No deformities or step offs noted. Full ROM. Supple. No nuchal rigidity.    Cardiovascular: Normal rate.   Pulmonary/Chest: Effort normal.  Musculoskeletal: Normal range of motion.  Neurological: She is alert and oriented to person, place, and time.  Skin: Skin is warm and dry.  Psychiatric: She has a normal mood and affect. Her behavior is normal.  Nursing note and vitals reviewed.    ED Treatments / Results  DIAGNOSTIC STUDIES: Oxygen Saturation is 99% on RA, normal by my interpretation.   COORDINATION OF CARE: 9:36 AM- Will prescribe Z-Pak and Flonase. Pt verbalizes understanding and agrees to plan.  Medications - No data to display  Labs (all labs ordered are listed, but  only abnormal results are displayed) Labs Reviewed  RAPID STREP SCREEN (NOT AT [facility])  CULTURE, GROUP A STREP [facility])    EKG  EKG Interpretation None       Radiology No results found.  Procedures Procedures (including critical care time)  Medications Ordered in ED Medications - No data to display   Initial Impression / Assessment and Plan / ED Course  I have reviewed the triage vital signs and the nursing notes.  Pertinent labs & imaging results that were available during my care of the patient were reviewed by me and considered in my medical decision making (see chart for details).     Pt symptoms consistent with URI. Rapid strep test negative. Will treat with Azithromycin for possible otitis media with mild effusion. Pt is insisting on abx. Will prescribe Flonase for nasal congestion. No nuchal rigidity concerning for meningitis. Patient is afebrile. No peritonsillar abscess or deep neck infection suspected. No mastoid tenderness. Patient is overall well-appearing and nontoxic. Pt given instructions for conservative symptomatic treatment. Discussed return precautions. Pt is hemodynamically stable & in NAD prior to discharge.   Final Clinical Impressions(s) / ED Diagnoses   Final diagnoses:  Acute otitis media, unspecified otitis media type  Sore throat    New Prescriptions New Prescriptions   AZITHROMYCIN (ZITHROMAX) 500 MG TABLET    Take 1 tablet (500 mg total) by mouth daily.   FLUTICASONE (FLONASE) 50 MCG/ACT NASAL SPRAY    Place 2 sprays into both nostrils daily.    I personally performed the services described in this documentation, which was scribed in my presence. The recorded information has been reviewed and is accurate.     Doristine Devoid, PA-C 06/25/16 1401    Forde Dandy, MD 06/25/16 [phone]

## 2016-06-27 LAB — CULTURE, GROUP A STREP (THRC)

## 2016-08-08 ENCOUNTER — Ambulatory Visit (INDEPENDENT_AMBULATORY_CARE_PROVIDER_SITE_OTHER): Payer: Medicare Other | Admitting: *Deleted

## 2016-08-08 DIAGNOSIS — I442 Atrioventricular block, complete: Secondary | ICD-10-CM

## 2016-08-09 NOTE — Progress Notes (Signed)
Remote pacemaker transmission.   

## 2016-08-10 ENCOUNTER — Encounter: Payer: Self-pay | Admitting: Cardiology

## 2016-08-16 LAB — CUP PACEART REMOTE DEVICE CHECK
Battery Remaining Longevity: 126 mo
Battery Remaining Percentage: 95.5 %
Battery Voltage: 3.01 V
Brady Statistic AS VS Percent: 1 %
Implantable Lead Implant Date: 20150202
Implantable Lead Implant Date: 20150202
Implantable Lead Location: 753859
Implantable Lead Location: 753860
Implantable Pulse Generator Implant Date: 20150202
Lead Channel Pacing Threshold Amplitude: 0.75 V
Lead Channel Pacing Threshold Pulse Width: 0.4 ms
Lead Channel Sensing Intrinsic Amplitude: 5 mV
Lead Channel Setting Pacing Amplitude: 1 V
Lead Channel Setting Sensing Sensitivity: 2 mV
MDC IDC MSMT LEADCHNL RA IMPEDANCE VALUE: 490 Ohm
MDC IDC MSMT LEADCHNL RA PACING THRESHOLD PULSEWIDTH: 0.4 ms
MDC IDC MSMT LEADCHNL RV IMPEDANCE VALUE: 480 Ohm
MDC IDC MSMT LEADCHNL RV PACING THRESHOLD AMPLITUDE: 0.75 V
MDC IDC MSMT LEADCHNL RV SENSING INTR AMPL: 6.6 mV
MDC IDC PG SERIAL: 7588973
MDC IDC SESS DTM: 20180806081007
MDC IDC SET LEADCHNL RA PACING AMPLITUDE: 2 V
MDC IDC SET LEADCHNL RV PACING PULSEWIDTH: 0.4 ms
MDC IDC STAT BRADY AP VP PERCENT: 44 %
MDC IDC STAT BRADY AP VS PERCENT: 1 %
MDC IDC STAT BRADY AS VP PERCENT: 55 %
MDC IDC STAT BRADY RA PERCENT PACED: 44 %
MDC IDC STAT BRADY RV PERCENT PACED: 99 %

## 2016-10-14 ENCOUNTER — Ambulatory Visit (HOSPITAL_COMMUNITY)
Admission: EM | Admit: 2016-10-14 | Discharge: 2016-10-14 | Disposition: A | Discharge status: Home / Self Care | Payer: Medicare Other | Attending: Family | Admitting: Family

## 2016-10-14 ENCOUNTER — Encounter (HOSPITAL_COMMUNITY): Payer: Self-pay | Admitting: Emergency Medicine

## 2016-10-14 DIAGNOSIS — R059 Cough, unspecified: Secondary | ICD-10-CM

## 2016-10-14 DIAGNOSIS — R21 Rash and other nonspecific skin eruption: Secondary | ICD-10-CM

## 2016-10-14 DIAGNOSIS — R05 Cough: Secondary | ICD-10-CM

## 2016-10-14 MED ORDER — DOXYCYCLINE HYCLATE 100 MG PO TBEC: 100.0000 mg | DELAYED_RELEASE_TABLET | Freq: Two times a day (BID) | ORAL | 0 refills | Status: AC

## 2016-10-14 MED ORDER — TRIAMCINOLONE ACETONIDE 0.025 % EX CREA: 1.0000 "application " | TOPICAL_CREAM | Freq: Two times a day (BID) | CUTANEOUS | 0 refills | Status: DC

## 2016-10-14 NOTE — Discharge Instructions (Signed)
Trial of hydrocortisone cream on rash.may apply this with a little Vaseline appears quite dry  Start doxycycline and mucinex OTC with plenty of water.   If there is no improvement in your symptoms, or if there is any worsening of symptoms, or if you have any additional concerns, please return for re-evaluation; or, if we are closed, consider going to the Emergency Room for evaluation if symptoms urgent.

## 2016-10-14 NOTE — ED Triage Notes (Signed)
Pt c/o prod cough onset 1 week associated. w/nasal congestion/drainage  Using her rescue inhalers w/no relief.   Also c/o rash to LLE onset 1 month +++   Pt is A&O x4... NAD... Ambulatory

## 2016-10-14 NOTE — ED Provider Notes (Signed)
[place]    CSN: [id number] Arrival date & time: 10/14/16  1331     History   Chief Complaint Chief Complaint  Patient presents with  . URI  . Rash    HPI Jessica Carpenter is a 66 y.o. female.   Chief complaint of productive cough 1 week, improved . 'concerned with dark mucous and 17 great grandchildren.'  Endorses sore throat, nasal drainage. Using Qvar, Proair with some relief.  Had been wheezing which has resolved. Using nasal spray with relief.   Denies exertional chest pain or pressure, numbness or tingling radiating to left arm or jaw, palpitations, dizziness, frequent headaches, changes in vision, or shortness of breath.   Also complains of rash to left lower extremity 1 month. Icthy. Tried lotrimin with no relief. Notes a friend who has lice and she had sat on his couch. Non draining. No new lotions or creams.  History of asthma, heart failure.    History of pacemaker, Complete heart block  Past Medical History:  Diagnosis Date  . Allergic rhinitis   . Arthritis    "left leg" (02/04/2013)  . Asthma   . Chronic bronchitis ([place])    "usually get it q yr" (02/04/2013)  . GERD (gastroesophageal reflux disease)   . Gout    "used to; haven't had it lately" (02/04/2013)  . Hypertension   . PONV (postoperative nausea and vomiting)   . Symptomatic bradycardia    s/p STJ dual chamber pacemaker by Dr Lovena Le 02/2013  . Type II diabetes mellitus [facility])     Patient Active Problem List   Diagnosis Date Noted  . Pacemaker 05/07/2013  . Atrioventricular block, complete ([place]) 02/04/2013  . Complete heart block ([place]) 01/31/2013  . CHF (congestive heart failure) ([place]) 01/31/2013  . ACUTE BRONCHITIS 02/10/2009  . SINUSITIS, ACUTE 06/17/2008  . CANDIDIASIS, ORAL 03/14/2008  . 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 Essential hypertension 09/15/2006  . ALLERGIC RHINITIS 09/15/2006  . COCAINE ABUSE, HX OF 09/15/1998    Past Surgical History:  Procedure Laterality  Date  . ABDOMINAL HYSTERECTOMY  ?1980's  . BREAST BIOPSY Right    "benign"  . BREAST LUMPECTOMY Right    "benign"  . DILATION AND CURETTAGE OF UTERUS    . GANGLION CYST EXCISION Right 2003   Ganglion of wrist, volar and distal radius  . PACEMAKER INSERTION  02/04/2013   STJ Assurity dual chamber pacemaker implanted by Dr Lovena Le for symptomatic bradycardia  . PERMANENT PACEMAKER INSERTION N/A 02/04/2013   Procedure: PERMANENT PACEMAKER INSERTION;  Surgeon: Evans Lance, MD;  Location: [facility] LAB;  Service: Cardiovascular;  Laterality: N/A;  . TUBAL LIGATION      OB History    No data available       Home Medications    Prior to Admission medications   Medication Sig Start Date End Date Taking? Authorizing Provider  albuterol (PROVENTIL HFA;VENTOLIN HFA) 108 (90 BASE) MCG/ACT inhaler Inhale 1-2 puffs into the lungs every 4 (four) hours as needed for wheezing or shortness of breath.   Yes [provider]  amLODipine (NORVASC) 10 MG tablet Take 10 mg by mouth daily.   Yes [provider]  aspirin EC 81 MG tablet Take 81 mg by mouth daily.   Yes [provider]  beclomethasone (QVAR) 80 MCG/ACT inhaler Inhale 2 puffs into the lungs 2 (two) times daily.   Yes [provider]  celecoxib (CELEBREX) 100 MG capsule Take 100 mg by mouth 2 (two)  times daily as needed (pain).    Yes [provider]  famotidine (PEPCID) 20 MG tablet Take 1 tablet (20 mg total) by mouth daily. 09/26/15  Yes Pisciotta, Elmyra Ricks, PA-C  fluticasone (FLONASE) 50 MCG/ACT nasal spray Place 2 sprays into both nostrils daily. 06/25/16  Yes Ocie Cornfield T, PA-C  glipiZIDE (GLUCOTROL XL) 10 MG 24 hr tablet Take 20 mg by mouth daily with breakfast. 11/18/14  Yes [provider]  hydrochlorothiazide (HYDRODIURIL) 25 MG tablet Take 25 mg by mouth daily.    Yes [provider]  ipratropium (ATROVENT) 0.06 % nasal spray Place 2 sprays into both nostrils 4 (four)  times daily. Prn nasal discharge 12/20/14  Yes Mabe, Shanon Brow, NP  losartan (COZAAR) 100 MG tablet Take 100 mg by mouth daily.   Yes [provider]  metformin (FORTAMET) 1000 MG (OSM) 24 hr tablet Take 1,000 mg by mouth 2 (two) times daily. 07/30/15  Yes [provider]  metFORMIN (GLUCOPHAGE-XR) 500 MG 24 hr tablet Take 500 mg by mouth daily with breakfast.   Yes [provider]  omeprazole (PRILOSEC) 20 MG capsule Take 1 capsule (20 mg total) by mouth 2 (two) times daily before a meal. 02/08/16  Yes Evans Lance, MD  azithromycin (ZITHROMAX) 500 MG tablet Take 1 tablet (500 mg total) by mouth daily. 06/25/16   Doristine Devoid, PA-C  doxycycline (DORYX) 100 MG EC tablet Take 1 tablet (100 mg total) by mouth 2 (two) times daily. 10/14/16 10/21/16  Burnard Hawthorne, FNP  nystatin (MYCOSTATIN) 100000 UNIT/ML suspension Take 5 mLs (500,000 Units total) by mouth 4 (four) times daily as needed (mouth sores). 02/22/14   Melony Overly, MD  Polyethyl Glycol-Propyl Glycol (SYSTANE OP) Place 1 drop into both eyes daily as needed (dry eyes).    [provider]  triamcinolone (KENALOG) 0.025 % cream Apply 1 application topically 2 (two) times daily. 10/14/16   Burnard Hawthorne, FNP    Family History Family History  Problem Relation Age of Onset  . Emphysema Father   . Breast cancer Mother     Social History Social History  Substance Use Topics  . Smoking status: Never Smoker  . Smokeless tobacco: Never Used  . Alcohol use 0.0 oz/week     Comment: 1 rarely      Allergies   Moxifloxacin and Penicillins   Review of Systems Review of Systems  Constitutional: Negative for chills and fever.  HENT: Positive for congestion and sore throat. Negative for ear pain and sinus pressure.   Respiratory: Positive for cough. Negative for shortness of breath and wheezing.   Cardiovascular: Negative for chest pain and palpitations.  Gastrointestinal: Negative for nausea  and vomiting.  Skin: Positive for rash.     Physical Exam Triage Vital Signs ED Triage Vitals [10/14/16 1345]  Enc Vitals Group     BP      Pulse Rate 67     Resp 16     Temp 98.1 F (36.7 C)     Temp Source Oral     SpO2 98 %     Weight      Height      Head Circumference      Peak Flow      Pain Score      Pain Loc      Pain Edu?      Excl. in [place]?    No data found.   Updated Vital Signs BP (!) 142/81 (  BP Location: Left Arm)   Pulse 67   Temp 98.1 F (36.7 C) (Oral)   Resp 16   SpO2 98%   Visual Acuity Right Eye Distance:   Left Eye Distance:   Bilateral Distance:    Right Eye Near:   Left Eye Near:    Bilateral Near:     Physical Exam  Constitutional: She appears well-developed and well-nourished.  HENT:  Head: Normocephalic and atraumatic.  Right Ear: Hearing, tympanic membrane, external ear and ear canal normal. No drainage, swelling or tenderness. No foreign bodies. Tympanic membrane is not erythematous and not bulging. No middle ear effusion. No decreased hearing is noted.  Left Ear: Hearing, tympanic membrane, external ear and ear canal normal. No drainage, swelling or tenderness. No foreign bodies. Tympanic membrane is not erythematous and not bulging.  No middle ear effusion. No decreased hearing is noted.  Nose: Nose normal. No rhinorrhea. Right sinus exhibits no maxillary sinus tenderness and no frontal sinus tenderness. Left sinus exhibits no maxillary sinus tenderness and no frontal sinus tenderness.  Mouth/Throat: Uvula is midline, oropharynx is clear and moist and mucous membranes are normal. No oropharyngeal exudate, posterior oropharyngeal edema, posterior oropharyngeal erythema or tonsillar abscesses.  Eyes: Conjunctivae are normal.  Cardiovascular: Regular rhythm, normal heart sounds and normal pulses.   No LE edema.   Pulmonary/Chest: Effort normal and breath sounds normal. She has no wheezes. She has no rhonchi. She has no rales.    Lymphadenopathy:       Head (right side): No submental, no submandibular, no tonsillar, no preauricular, no posterior auricular and no occipital adenopathy present.       Head (left side): No submental, no submandibular, no tonsillar, no preauricular, no posterior auricular and no occipital adenopathy present.    She has no cervical adenopathy.  Neurological: She is alert.  Skin: Skin is warm and dry. Rash noted. Rash is maculopapular.     Grouped maculopapular lesions. No vesicles, no discharge. No increase in warmth. No surrounding erythema. Excoriations. Dry skin.   Psychiatric: She has a normal mood and affect. Her speech is normal and behavior is normal. Thought content normal.  Vitals reviewed.    UC Treatments / Results  Labs (all labs ordered are listed, but only abnormal results are displayed) Labs Reviewed - No data to display  EKG  EKG Interpretation None       Radiology No results found.  Procedures Procedures (including critical care time)  Medications Ordered in UC Medications - No data to display   Initial Impression / Assessment and Plan / UC Course  I have reviewed the triage vital signs and the nursing notes.  Pertinent labs & imaging results that were available during my care of the patient were reviewed by me and considered in my medical decision making (see chart for details).       Final Clinical Impressions(s) / UC Diagnoses   Final diagnoses:  Rash  Cough  In context of asthma, agreed to starting antibiotic therapy for patient. Encouraged her to continue to use inhalers at home. She is well-appearing and in no acute respiratory distress. Etiology of rash non specific at this time and is unchanged. We'll trial topical corticosteroid for contact dermatitis. Return precautions given.  New Prescriptions New Prescriptions   DOXYCYCLINE (DORYX) 100 MG EC TABLET    Take 1 tablet (100 mg total) by mouth 2 (two) times daily.   TRIAMCINOLONE  (KENALOG) 0.025 % CREAM    Apply 1 application  topically 2 (two) times daily.     Controlled Substance Prescriptions  Controlled Substance Registry consulted? Not Applicable   Burnard Hawthorne, [place] 10/14/16 1419

## 2016-11-07 ENCOUNTER — Ambulatory Visit (INDEPENDENT_AMBULATORY_CARE_PROVIDER_SITE_OTHER): Payer: Medicare Other | Admitting: *Deleted

## 2016-11-07 DIAGNOSIS — I442 Atrioventricular block, complete: Secondary | ICD-10-CM

## 2016-11-08 NOTE — Progress Notes (Signed)
Remote pacemaker transmission.   

## 2016-11-09 ENCOUNTER — Encounter: Payer: Self-pay | Admitting: Cardiology

## 2016-11-22 LAB — CUP PACEART REMOTE DEVICE CHECK
Battery Remaining Longevity: 127 mo
Battery Remaining Percentage: 95.5 %
Battery Voltage: 2.99 V
Brady Statistic RA Percent Paced: 46 %
Date Time Interrogation Session: 20181105060014
Implantable Lead Implant Date: 20150202
Implantable Lead Location: 753860
Implantable Pulse Generator Implant Date: 20150202
Lead Channel Impedance Value: 490 Ohm
Lead Channel Impedance Value: 530 Ohm
Lead Channel Pacing Threshold Amplitude: 0.75 V
Lead Channel Pacing Threshold Pulse Width: 0.4 ms
Lead Channel Sensing Intrinsic Amplitude: 5 mV
MDC IDC LEAD IMPLANT DT: 20150202
MDC IDC LEAD LOCATION: 753859
MDC IDC MSMT LEADCHNL RV PACING THRESHOLD AMPLITUDE: 0.625 V
MDC IDC MSMT LEADCHNL RV PACING THRESHOLD PULSEWIDTH: 0.4 ms
MDC IDC MSMT LEADCHNL RV SENSING INTR AMPL: 8.7 mV
MDC IDC PG SERIAL: 7588973
MDC IDC SET LEADCHNL RA PACING AMPLITUDE: 2 V
MDC IDC SET LEADCHNL RV PACING AMPLITUDE: 0.875
MDC IDC SET LEADCHNL RV PACING PULSEWIDTH: 0.4 ms
MDC IDC SET LEADCHNL RV SENSING SENSITIVITY: 2 mV
MDC IDC STAT BRADY AP VP PERCENT: 46 %
MDC IDC STAT BRADY AP VS PERCENT: 1 %
MDC IDC STAT BRADY AS VP PERCENT: 54 %
MDC IDC STAT BRADY AS VS PERCENT: 1 %
MDC IDC STAT BRADY RV PERCENT PACED: 99 %

## 2017-02-06 ENCOUNTER — Ambulatory Visit (INDEPENDENT_AMBULATORY_CARE_PROVIDER_SITE_OTHER): Payer: Medicare Other | Admitting: *Deleted

## 2017-02-06 DIAGNOSIS — I442 Atrioventricular block, complete: Secondary | ICD-10-CM | POA: Diagnosis not present

## 2017-02-06 NOTE — Progress Notes (Signed)
Remote pacemaker transmission.   

## 2017-02-07 ENCOUNTER — Encounter: Payer: Self-pay | Admitting: Cardiology

## 2017-03-03 ENCOUNTER — Encounter: Payer: Self-pay | Admitting: Internal Medicine

## 2017-03-03 ENCOUNTER — Ambulatory Visit (INDEPENDENT_AMBULATORY_CARE_PROVIDER_SITE_OTHER): Payer: Medicare Other | Admitting: Internal Medicine

## 2017-03-03 ENCOUNTER — Encounter (INDEPENDENT_AMBULATORY_CARE_PROVIDER_SITE_OTHER): Payer: Self-pay

## 2017-03-03 VITALS — BP 124/70 | HR 60 | Ht 64.0 in | Wt 191.0 lb

## 2017-03-03 DIAGNOSIS — I442 Atrioventricular block, complete: Secondary | ICD-10-CM | POA: Diagnosis not present

## 2017-03-03 DIAGNOSIS — I10 Essential (primary) hypertension: Secondary | ICD-10-CM

## 2017-03-03 DIAGNOSIS — Z950 Presence of cardiac pacemaker: Secondary | ICD-10-CM | POA: Diagnosis not present

## 2017-03-03 LAB — CUP PACEART INCLINIC DEVICE CHECK
Battery Remaining Longevity: 128 mo
Brady Statistic RA Percent Paced: 46 %
Implantable Lead Implant Date: 20150202
Implantable Lead Location: 753859
Lead Channel Impedance Value: 487.5 Ohm
Lead Channel Impedance Value: 537.5 Ohm
Lead Channel Pacing Threshold Amplitude: 0.75 V
Lead Channel Pacing Threshold Pulse Width: 0.4 ms
Lead Channel Pacing Threshold Pulse Width: 0.4 ms
Lead Channel Sensing Intrinsic Amplitude: 5 mV
Lead Channel Setting Pacing Amplitude: 0.875
Lead Channel Setting Sensing Sensitivity: 2 mV
MDC IDC LEAD IMPLANT DT: 20150202
MDC IDC LEAD LOCATION: 753860
MDC IDC MSMT BATTERY VOLTAGE: 2.99 V
MDC IDC MSMT LEADCHNL RA PACING THRESHOLD AMPLITUDE: 0.75 V
MDC IDC MSMT LEADCHNL RA PACING THRESHOLD PULSEWIDTH: 0.4 ms
MDC IDC MSMT LEADCHNL RV PACING THRESHOLD AMPLITUDE: 0.625 V
MDC IDC MSMT LEADCHNL RV SENSING INTR AMPL: 4.3 mV
MDC IDC PG IMPLANT DT: 20150202
MDC IDC PG SERIAL: 7588973
MDC IDC SESS DTM: 20190301113758
MDC IDC SET LEADCHNL RA PACING AMPLITUDE: 2 V
MDC IDC SET LEADCHNL RV PACING PULSEWIDTH: 0.4 ms
MDC IDC STAT BRADY RV PERCENT PACED: 99.93 %

## 2017-03-03 NOTE — Patient Instructions (Signed)
Medication Instructions:  Your physician recommends that you continue on your current medications as directed. Please refer to the Current Medication list given to you today.  Labwork: None ordered.  Testing/Procedures: None ordered.  Follow-Up: Your physician wants you to follow-up in: one year with Dr. Lovena Le.   You will receive a reminder letter in the mail two months in advance. If you don't receive a letter, please call our office to schedule the follow-up appointment.  Remote monitoring is used to monitor your Pacemaker from home. This monitoring reduces the number of office visits required to check your device to one time per year. It allows [place] to keep an eye on the functioning of your device to ensure it is working properly. You are scheduled for a device check from home on 05/08/2017. You may send your transmission at any time that day. If you have a wireless device, the transmission will be sent automatically. After your physician reviews your transmission, you will receive a postcard with your next transmission date.  Any Other Special Instructions Will Be Listed Below (If Applicable).  If you need a refill on your cardiac medications before your next appointment, please call your pharmacy.

## 2017-03-03 NOTE — Progress Notes (Signed)
HPI Jessica Carpenter returns today for ongoing evaluation and management of complete heart block status post pacemaker insertion, HTN, and COPD.  She is a very pleasant 67 year old woman with the above problems.  She remains active although this winter she has been less so.  She has no trouble walking. Her dyspnea is class 2. Minimal peripheral edema.  Allergies  Allergen Reactions  . Moxifloxacin Hives, Shortness Of Breath and Other (See Comments)    Caused syncope and throat tightness  . Penicillins Hives, Shortness Of Breath and Other (See Comments)    Caused syncope and throat tightness Has patient had a PCN reaction causing immediate rash, facial/tongue/throat swelling, SOB or lightheadedness with hypotension: Yes Has patient had a PCN reaction causing severe rash involving mucus membranes or skin necrosis: No Has patient had a PCN reaction that required hospitalization: hospital visit but not overnight Has patient had a PCN reaction occurring within the last 10 years: No If all of the above answers are "NO", then may proceed with Cephalosporin     Current Outpatient Medications  Medication Sig Dispense Refill  . albuterol (PROVENTIL HFA;VENTOLIN HFA) 108 (90 BASE) MCG/ACT inhaler Inhale 1-2 puffs into the lungs every 4 (four) hours as needed for wheezing or shortness of breath.    Marland Kitchen amLODipine (NORVASC) 10 MG tablet Take 10 mg by mouth daily.    Marland Kitchen aspirin EC 81 MG tablet Take 81 mg by mouth daily.    Marland Kitchen azithromycin (ZITHROMAX) 500 MG tablet Take 1 tablet (500 mg total) by mouth daily. 5 tablet 0  . beclomethasone (QVAR) 80 MCG/ACT inhaler Inhale 2 puffs into the lungs 2 (two) times daily.    . celecoxib (CELEBREX) 100 MG capsule Take 100 mg by mouth 2 (two) times daily as needed (pain).     . famotidine (PEPCID) 20 MG tablet Take 1 tablet (20 mg total) by mouth daily. 30 tablet 0  . fluticasone (FLONASE) 50 MCG/ACT nasal spray Place 2 sprays into both nostrils daily. 16 g 12  .  glipiZIDE (GLUCOTROL XL) 10 MG 24 hr tablet Take 20 mg by mouth daily with breakfast.  3  . hydrochlorothiazide (HYDRODIURIL) 25 MG tablet Take 25 mg by mouth daily.     Marland Kitchen ipratropium (ATROVENT) 0.06 % nasal spray Place 2 sprays into both nostrils 4 (four) times daily. Prn nasal discharge 15 mL 12  . losartan (COZAAR) 100 MG tablet Take 100 mg by mouth daily.    . metformin (FORTAMET) 1000 MG (OSM) 24 hr tablet Take 1,000 mg by mouth 2 (two) times daily.    . metFORMIN (GLUCOPHAGE-XR) 500 MG 24 hr tablet Take 500 mg by mouth daily with breakfast.    . nystatin (MYCOSTATIN) 100000 UNIT/ML suspension Take 5 mLs (500,000 Units total) by mouth 4 (four) times daily as needed (mouth sores). 180 mL 0  . omeprazole (PRILOSEC) 20 MG capsule Take 1 capsule (20 mg total) by mouth 2 (two) times daily before a meal. 180 capsule 3  . Polyethyl Glycol-Propyl Glycol (SYSTANE OP) Place 1 drop into both eyes daily as needed (dry eyes).    . triamcinolone (KENALOG) 0.025 % cream Apply 1 application topically 2 (two) times daily. 15 g 0   No current facility-administered medications for this visit.      Past Medical History:  Diagnosis Date  . Allergic rhinitis   . Arthritis    "left leg" (02/04/2013)  . Asthma   . Chronic bronchitis ([place])    "  usually get it q yr" (02/04/2013)  . GERD (gastroesophageal reflux disease)   . Gout    "used to; haven't had it lately" (02/04/2013)  . Hypertension   . PONV (postoperative nausea and vomiting)   . Symptomatic bradycardia    s/p STJ dual chamber pacemaker by Dr Lovena Le 02/2013  . Type II diabetes mellitus ([facility])     ROS:   All systems reviewed and negative except as noted in the HPI.   Past Surgical History:  Procedure Laterality Date  . ABDOMINAL HYSTERECTOMY  ?1980's  . BREAST BIOPSY Right    "benign"  . BREAST LUMPECTOMY Right    "benign"  . DILATION AND CURETTAGE OF UTERUS    . GANGLION CYST EXCISION Right 2003   Ganglion of wrist, volar and distal  radius  . PACEMAKER INSERTION  02/04/2013   STJ Assurity dual chamber pacemaker implanted by Dr Lovena Le for symptomatic bradycardia  . PERMANENT PACEMAKER INSERTION N/A 02/04/2013   Procedure: PERMANENT PACEMAKER INSERTION;  Surgeon: Evans Lance, MD;  Location: [facility] CATH LAB;  Service: Cardiovascular;  Laterality: N/A;  . TUBAL LIGATION       Family History  Problem Relation Age of Onset  . Emphysema Father   . Breast cancer Mother      Social History   Socioeconomic History  . Marital status: Divorced    Spouse name: Not on file  . Number of children: Not on file  . Years of education: Not on file  . Highest education level: Not on file  Social Needs  . Financial resource strain: Not on file  . Food insecurity - worry: Not on file  . Food insecurity - inability: Not on file  . Transportation needs - medical: Not on file  . Transportation needs - non-medical: Not on file  Occupational History  . Occupation: disabled   Tobacco Use  . Smoking status: Never Smoker  . Smokeless tobacco: Never Used  Substance and Sexual Activity  . Alcohol use: Yes    Alcohol/week: 0.0 oz    Comment: 1 rarely   . Drug use: No  . Sexual activity: Not on file  Other Topics Concern  . Not on file  Social History Narrative  . 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  See PaceArt for details.    Assess/Plan: 1. CHB - she has no escape today. She is asymptomatic, s/p PPM insertion. 2. PPM - her medtronic DDD PM is working normally and she has about 10 years of battery longevity 3. HTN - her blood pressure is controlled. She will continue her current meds as noted above.  Mikle Bosworth.D.

## 2017-03-04 LAB — CUP PACEART REMOTE DEVICE CHECK
Battery Remaining Percentage: 95.5 %
Battery Voltage: 2.99 V
Brady Statistic AP VP Percent: 47 %
Brady Statistic AS VS Percent: 1 %
Brady Statistic RA Percent Paced: 47 %
Brady Statistic RV Percent Paced: 99 %
Date Time Interrogation Session: 20190204070012
Implantable Lead Implant Date: 20150202
Implantable Lead Implant Date: 20150202
Implantable Lead Location: 753860
Implantable Pulse Generator Implant Date: 20150202
Lead Channel Impedance Value: 480 Ohm
Lead Channel Pacing Threshold Amplitude: 0.75 V
Lead Channel Pacing Threshold Pulse Width: 0.4 ms
Lead Channel Pacing Threshold Pulse Width: 0.4 ms
Lead Channel Sensing Intrinsic Amplitude: 5 mV
Lead Channel Setting Pacing Amplitude: 0.875
Lead Channel Setting Sensing Sensitivity: 2 mV
MDC IDC LEAD LOCATION: 753859
MDC IDC MSMT BATTERY REMAINING LONGEVITY: 128 mo
MDC IDC MSMT LEADCHNL RA IMPEDANCE VALUE: 530 Ohm
MDC IDC MSMT LEADCHNL RV PACING THRESHOLD AMPLITUDE: 0.625 V
MDC IDC MSMT LEADCHNL RV SENSING INTR AMPL: 4.3 mV
MDC IDC PG SERIAL: 7588973
MDC IDC SET LEADCHNL RA PACING AMPLITUDE: 2 V
MDC IDC SET LEADCHNL RV PACING PULSEWIDTH: 0.4 ms
MDC IDC STAT BRADY AP VS PERCENT: 1 %
MDC IDC STAT BRADY AS VP PERCENT: 53 %

## 2017-03-05 ENCOUNTER — Encounter: Payer: Self-pay | Admitting: Internal Medicine

## 2017-04-24 ENCOUNTER — Other Ambulatory Visit: Payer: Self-pay | Admitting: Internal Medicine

## 2017-05-08 ENCOUNTER — Ambulatory Visit (INDEPENDENT_AMBULATORY_CARE_PROVIDER_SITE_OTHER): Payer: Medicare Other | Admitting: *Deleted

## 2017-05-08 DIAGNOSIS — I442 Atrioventricular block, complete: Secondary | ICD-10-CM

## 2017-05-09 NOTE — Progress Notes (Signed)
Remote pacemaker transmission.   

## 2017-05-10 ENCOUNTER — Encounter: Payer: Self-pay | Admitting: Cardiology

## 2017-05-22 LAB — CUP PACEART REMOTE DEVICE CHECK
Battery Remaining Longevity: 127 mo
Battery Remaining Percentage: 95.5 %
Brady Statistic AP VP Percent: 45 %
Brady Statistic AP VS Percent: 1 %
Brady Statistic AS VP Percent: 55 %
Implantable Lead Implant Date: 20150202
Implantable Lead Location: 753860
Lead Channel Impedance Value: 490 Ohm
Lead Channel Pacing Threshold Amplitude: 0.625 V
Lead Channel Sensing Intrinsic Amplitude: 4.3 mV
Lead Channel Setting Pacing Amplitude: 2 V
Lead Channel Setting Pacing Pulse Width: 0.4 ms
MDC IDC LEAD IMPLANT DT: 20150202
MDC IDC LEAD LOCATION: 753859
MDC IDC MSMT BATTERY VOLTAGE: 2.99 V
MDC IDC MSMT LEADCHNL RA PACING THRESHOLD AMPLITUDE: 0.75 V
MDC IDC MSMT LEADCHNL RA PACING THRESHOLD PULSEWIDTH: 0.4 ms
MDC IDC MSMT LEADCHNL RA SENSING INTR AMPL: 4.2 mV
MDC IDC MSMT LEADCHNL RV IMPEDANCE VALUE: 480 Ohm
MDC IDC MSMT LEADCHNL RV PACING THRESHOLD PULSEWIDTH: 0.4 ms
MDC IDC PG IMPLANT DT: 20150202
MDC IDC PG SERIAL: 7588973
MDC IDC SESS DTM: 20190506081230
MDC IDC SET LEADCHNL RV PACING AMPLITUDE: 0.875
MDC IDC SET LEADCHNL RV SENSING SENSITIVITY: 2 mV
MDC IDC STAT BRADY AS VS PERCENT: 1 %
MDC IDC STAT BRADY RA PERCENT PACED: 45 %
MDC IDC STAT BRADY RV PERCENT PACED: 99 %

## 2017-08-07 ENCOUNTER — Ambulatory Visit (INDEPENDENT_AMBULATORY_CARE_PROVIDER_SITE_OTHER): Payer: Medicare Other | Admitting: *Deleted

## 2017-08-07 DIAGNOSIS — I442 Atrioventricular block, complete: Secondary | ICD-10-CM | POA: Diagnosis not present

## 2017-08-09 NOTE — Progress Notes (Signed)
Remote pacemaker transmission.   

## 2017-09-05 LAB — CUP PACEART REMOTE DEVICE CHECK
Battery Voltage: 2.99 V
Brady Statistic AP VP Percent: 43 %
Brady Statistic AP VS Percent: 1 %
Brady Statistic AS VP Percent: 57 %
Brady Statistic AS VS Percent: 1 %
Brady Statistic RV Percent Paced: 99 %
Date Time Interrogation Session: 20190805165715
Implantable Lead Implant Date: 20150202
Implantable Lead Location: 753859
Implantable Pulse Generator Implant Date: 20150202
Lead Channel Impedance Value: 440 Ohm
Lead Channel Pacing Threshold Amplitude: 0.625 V
Lead Channel Pacing Threshold Amplitude: 0.75 V
Lead Channel Pacing Threshold Pulse Width: 0.4 ms
Lead Channel Setting Pacing Amplitude: 0.875
Lead Channel Setting Pacing Pulse Width: 0.4 ms
Lead Channel Setting Sensing Sensitivity: 2 mV
MDC IDC LEAD IMPLANT DT: 20150202
MDC IDC LEAD LOCATION: 753860
MDC IDC MSMT BATTERY REMAINING LONGEVITY: 124 mo
MDC IDC MSMT BATTERY REMAINING PERCENTAGE: 95.5 %
MDC IDC MSMT LEADCHNL RA IMPEDANCE VALUE: 410 Ohm
MDC IDC MSMT LEADCHNL RA PACING THRESHOLD PULSEWIDTH: 0.4 ms
MDC IDC MSMT LEADCHNL RA SENSING INTR AMPL: 3.1 mV
MDC IDC MSMT LEADCHNL RV SENSING INTR AMPL: 4.3 mV
MDC IDC PG SERIAL: 7588973
MDC IDC SET LEADCHNL RA PACING AMPLITUDE: 2 V
MDC IDC STAT BRADY RA PERCENT PACED: 43 %

## 2017-11-06 ENCOUNTER — Ambulatory Visit (INDEPENDENT_AMBULATORY_CARE_PROVIDER_SITE_OTHER): Payer: Medicare Other | Admitting: *Deleted

## 2017-11-06 DIAGNOSIS — I442 Atrioventricular block, complete: Secondary | ICD-10-CM

## 2017-11-06 NOTE — Progress Notes (Signed)
Remote pacemaker transmission.   

## 2017-11-09 ENCOUNTER — Encounter: Payer: Self-pay | Admitting: Cardiology

## 2017-11-23 ENCOUNTER — Ambulatory Visit (HOSPITAL_COMMUNITY)
Admission: EM | Admit: 2017-11-23 | Discharge: 2017-11-23 | Disposition: A | Discharge status: Home / Self Care | Payer: Medicare Other | Attending: Family Medicine | Admitting: Family Medicine

## 2017-11-23 ENCOUNTER — Encounter (HOSPITAL_COMMUNITY): Payer: Self-pay | Admitting: Emergency Medicine

## 2017-11-23 ENCOUNTER — Ambulatory Visit (INDEPENDENT_AMBULATORY_CARE_PROVIDER_SITE_OTHER): Payer: Medicare Other

## 2017-11-23 DIAGNOSIS — J40 Bronchitis, not specified as acute or chronic: Secondary | ICD-10-CM

## 2017-11-23 MED ORDER — PREDNISONE 10 MG PO TABS: 20.0000 mg | ORAL_TABLET | Freq: Every day | ORAL | 0 refills | Status: AC

## 2017-11-23 MED ORDER — GUAIFENESIN ER 600 MG PO TB12: 600.0000 mg | ORAL_TABLET | Freq: Two times a day (BID) | ORAL | 0 refills | Status: DC

## 2017-11-23 MED ORDER — ALBUTEROL SULFATE HFA 108 (90 BASE) MCG/ACT IN AERS: 1.0000 | INHALATION_SPRAY | RESPIRATORY_TRACT | 1 refills | Status: DC | PRN

## 2017-11-23 NOTE — ED Triage Notes (Signed)
Pt c/o cough for a few days, hx of asthma, has used her inhaler and nasal spray with minimal relief. Worried about bronchitis.

## 2017-11-23 NOTE — ED Provider Notes (Signed)
[place]    CSN: [id number] Arrival date & time: 11/23/17  1135     History   Chief Complaint Chief Complaint  Patient presents with  . Cough    HPI Jessica Carpenter is a 67 y.o. female.   Patient is a 67 year old female with past medical history of asthma, chronic bronchitis, diabetes, hypertension, GERD who presents for cough, congestion, low-grade fever, body aches, chills.  This is been ongoing and worsening over the last 3 days.  She is been using her albuterol inhaler as needed without much relief of symptoms.  She denies any use of over-the-counter cough or congestion medication.  She denies any sick contacts but admits to secondhand smoke and vapor exposure in her house.  She denies smoking.  She is having some mild chest pain with coughing.  She denies any ear pain, sore throat, shortness of breath or palpitations.  ROS per HPI      Past Medical History:  Diagnosis Date  . Allergic rhinitis   . Arthritis    "left leg" (02/04/2013)  . Asthma   . Chronic bronchitis ([place])    "usually get it q yr" (02/04/2013)  . GERD (gastroesophageal reflux disease)   . Gout    "used to; haven't had it lately" (02/04/2013)  . Hypertension   . PONV (postoperative nausea and vomiting)   . Symptomatic bradycardia    s/p STJ dual chamber pacemaker by Dr Lovena Le 02/2013  . Type II diabetes mellitus [facility])     Patient Active Problem List   Diagnosis Date Noted  . Pacemaker 05/07/2013  . 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 CHF (congestive heart failure) ([place]) 01/31/2013  . ACUTE BRONCHITIS 02/10/2009  . SINUSITIS, ACUTE 06/17/2008  . CANDIDIASIS, ORAL 03/14/2008  . 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 Essential hypertension 09/15/2006  . ALLERGIC RHINITIS 09/15/2006  . COCAINE ABUSE, HX OF 09/15/1998    Past Surgical History:  Procedure Laterality Date  . ABDOMINAL HYSTERECTOMY  ?1980's  . BREAST BIOPSY Right    "benign"  . BREAST LUMPECTOMY Right    "benign"  . DILATION AND CURETTAGE OF UTERUS    . GANGLION CYST EXCISION Right 2003   Ganglion of wrist, volar and distal radius  . PACEMAKER INSERTION  02/04/2013   STJ Assurity dual chamber pacemaker implanted by Dr Lovena Le for symptomatic bradycardia  . PERMANENT PACEMAKER INSERTION N/A 02/04/2013   Procedure: PERMANENT PACEMAKER INSERTION;  Surgeon: Evans Lance, MD;  Location: [facility] CATH LAB;  Service: Cardiovascular;  Laterality: N/A;  . TUBAL LIGATION      OB History   None      Home Medications    Prior to Admission medications   Medication Sig Start Date End Date Taking? Authorizing Provider  albuterol (PROVENTIL HFA;VENTOLIN HFA) 108 (90 Base) MCG/ACT inhaler Inhale 1-2 puffs into the lungs every 4 (four) hours as needed for wheezing or shortness of breath. 11/23/17   Amol Domanski, Tressia Miners A, NP  amLODipine (NORVASC) 10 MG tablet Take 10 mg by mouth daily.    [provider]  aspirin EC 81 MG tablet Take 81 mg by mouth daily.    [provider]  beclomethasone (QVAR) 80 MCG/ACT inhaler Inhale 2 puffs into the lungs 2 (two) times daily.    [provider]  celecoxib (CELEBREX) 100 MG capsule Take 100 mg by mouth 2 (two) times daily as needed (pain).     [provider]  famotidine (PEPCID) 20 MG tablet Take 1 tablet (20 mg total) by mouth daily. 09/26/15   Pisciotta, Elmyra Ricks, PA-C  fluticasone (FLONASE) 50 MCG/ACT nasal spray Place 2 sprays into both nostrils daily. 06/25/16   Doristine Devoid, PA-C  glipiZIDE (GLUCOTROL XL) 10 MG 24 hr tablet Take 20 mg by mouth daily with breakfast. 11/18/14   [provider]  guaiFENesin (MUCINEX) 600 MG 12 hr tablet Take 1 tablet (600 mg total) by mouth 2 (two) times daily. 11/23/17   Loura Halt A, NP  hydrochlorothiazide (HYDRODIURIL) 25 MG tablet Take 25 mg by mouth daily.     [provider]  ipratropium (ATROVENT) 0.06 % nasal spray Place 2 sprays into both  nostrils 4 (four) times daily. Prn nasal discharge 12/20/14   Janne Napoleon, NP  losartan (COZAAR) 100 MG tablet Take 100 mg by mouth daily.    [provider]  metformin (FORTAMET) 1000 MG (OSM) 24 hr tablet Take 1,000 mg by mouth 2 (two) times daily. 07/30/15   [provider]  metFORMIN (GLUCOPHAGE-XR) 500 MG 24 hr tablet Take 500 mg by mouth daily with breakfast.    [provider]  nystatin (MYCOSTATIN) 100000 UNIT/ML suspension Take 5 mLs (500,000 Units total) by mouth 4 (four) times daily as needed (mouth sores). 02/22/14   Melony Overly, MD  omeprazole (PRILOSEC) 20 MG capsule TAKE 1 CAPSULE (20 MG TOTAL) BY MOUTH 2 (TWO) TIMES DAILY BEFORE A MEAL. 04/24/17   Evans Lance, MD  Polyethyl Glycol-Propyl Glycol (SYSTANE OP) Place 1 drop into both eyes daily as needed (dry eyes).    [provider]  predniSONE (DELTASONE) 10 MG tablet Take 2 tablets (20 mg total) by mouth daily for 5 days. 11/23/17 11/28/17  Loura Halt A, NP  triamcinolone (KENALOG) 0.025 % cream Apply 1 application topically 2 (two) times daily. 10/14/16   Burnard Hawthorne, FNP    Family History Family History  Problem Relation Age of Onset  . Emphysema Father   . Breast cancer Mother     Social History Social History   Tobacco Use  . Smoking status: Never Smoker  . Smokeless tobacco: Never Used  Substance Use Topics  . Alcohol use: Yes    Alcohol/week: 0.0 standard drinks    Comment: 1 rarely   . Drug use: No     Allergies   Moxifloxacin and Penicillins   Review of Systems Review of Systems   Physical Exam Triage Vital Signs ED Triage Vitals [11/23/17 1225]  Enc Vitals Group     BP 137/77     Pulse Rate 72     Resp 16     Temp 98 F (36.7 C)     Temp src      SpO2 100 %     Weight      Height      Head Circumference      Peak Flow      Pain Score 0     Pain Loc      Pain Edu?      Excl. in [place]?    No data found.  Updated Vital Signs BP 137/77    Pulse 72   Temp 98 F (36.7 C)   Resp 16   SpO2 100%   Visual Acuity Right Eye Distance:   Left Eye Distance:   Bilateral Distance:    Right Eye Near:   Left Eye Near:    Bilateral Near:  Physical Exam  Constitutional: She appears well-developed and well-nourished.  HENT:  Head: Normocephalic and atraumatic.  Right Ear: External ear normal.  Left Ear: External ear normal.  Mouth/Throat: Oropharynx is clear and moist.  Eyes: Conjunctivae are normal.  Neck: Normal range of motion.  Cardiovascular: Normal rate, regular rhythm and normal heart sounds.  Pulmonary/Chest: Effort normal. She exhibits no tenderness.  Coarse lung sounds throughout lung fields  Musculoskeletal: Normal range of motion.  Neurological: She is alert.  Skin: Skin is warm and dry.  Psychiatric: She has a normal mood and affect.  Nursing note and vitals reviewed.    UC Treatments / Results  Labs (all labs ordered are listed, but only abnormal results are displayed) Labs Reviewed - No data to display  EKG None  Radiology Dg Chest 2 View  Result Date: 11/23/2017 CLINICAL DATA:  The patient complains of cough and chest pain for 4 days. Shortness of breath. Productive cough with thick mucous. EXAM: CHEST - 2 VIEW COMPARISON:  01/28/2014 FINDINGS: LEFT-sided transvenous pacemaker with leads to the RIGHT atrium and RIGHT ventricle. The heart size is normal. There are no focal consolidations. No pleural effusions or edema. IMPRESSION: No active cardiopulmonary disease. Electronically Signed   By: Nolon Nations M.D.   On: 11/23/2017 13:36    Procedures Procedures (including critical care time)  Medications Ordered in UC Medications - No data to display  Initial Impression / Assessment and Plan / UC Course  I have reviewed the triage vital signs and the nursing notes.  Pertinent labs & imaging results that were available during my care of the patient were reviewed by me and considered in my  medical decision making (see chart for details).     X ray negative for PNA Will go ahead and treat for bronchitis based on hx and symptoms.  Albuterol inhaler as needed Prednisone daily for 5 days Mucinex for cough, congestion Follow up as needed for continued or worsening symptoms   Final Clinical Impressions(s) / UC Diagnoses   Final diagnoses:  Bronchitis     Discharge Instructions     Your x-ray did not reveal any pneumonia We will go ahead and treat you for bronchitis with albuterol inhaler, prednisone for 5 days, and Mucinex, Follow up as needed for continued or worsening symptoms     ED Prescriptions    Medication Sig Dispense Auth. Provider   albuterol (PROVENTIL HFA;VENTOLIN HFA) 108 (90 Base) MCG/ACT inhaler Inhale 1-2 puffs into the lungs every 4 (four) hours as needed for wheezing or shortness of breath. 1 Inhaler Kimyetta Flott A, NP   guaiFENesin (MUCINEX) 600 MG 12 hr tablet Take 1 tablet (600 mg total) by mouth 2 (two) times daily. 15 tablet Nija Koopman A, NP   predniSONE (DELTASONE) 10 MG tablet Take 2 tablets (20 mg total) by mouth daily for 5 days. 10 tablet Loura Halt A, NP     Controlled Substance Prescriptions [place] Controlled Substance Registry consulted? Not Applicable   Orvan July, NP 11/23/17 1353

## 2017-11-23 NOTE — Discharge Instructions (Signed)
Your x-ray did not reveal any pneumonia We will go ahead and treat you for bronchitis with albuterol inhaler, prednisone for 5 days, and Mucinex, Follow up as needed for continued or worsening symptoms

## 2017-12-20 ENCOUNTER — Ambulatory Visit (HOSPITAL_COMMUNITY)
Admission: EM | Admit: 2017-12-20 | Discharge: 2017-12-20 | Disposition: A | Discharge status: Home / Self Care | Payer: Medicare Other | Attending: Family Medicine | Admitting: Family Medicine

## 2017-12-20 ENCOUNTER — Encounter (HOSPITAL_COMMUNITY): Payer: Self-pay | Admitting: Emergency Medicine

## 2017-12-20 DIAGNOSIS — L509 Urticaria, unspecified: Secondary | ICD-10-CM

## 2017-12-20 MED ORDER — METHYLPREDNISOLONE 4 MG PO TABS: 4.0000 mg | ORAL_TABLET | Freq: Two times a day (BID) | ORAL | 1 refills | Status: DC

## 2017-12-20 NOTE — ED Provider Notes (Signed)
[place]    CSN: [id number] Arrival date & time: 12/20/17  1011     History   Chief Complaint Chief Complaint  Patient presents with  . Rash    HPI Jessica Carpenter is a 67 y.o. female.   This is a 67 year old established [facility] patient who comes in with complaints of intermittent rash and itching for a week.  No new laundry, food, medication, environmental stresses like new pet, fever, cough, sore throat, dysuria.  Patient is never had this before.  The rash comes and goes and is quite itchy.  Patient is under great stress with her daughter having breast cancer, relative recently being incarcerated.     Past Medical History:  Diagnosis Date  . Allergic rhinitis   . Arthritis    "left leg" (02/04/2013)  . Asthma   . Chronic bronchitis ([place])    "usually get it q yr" (02/04/2013)  . GERD (gastroesophageal reflux disease)   . Gout    "used to; haven't had it lately" (02/04/2013)  . Hypertension   . PONV (postoperative nausea and vomiting)   . Symptomatic bradycardia    s/p STJ dual chamber pacemaker by Dr Lovena Le 02/2013  . Type II diabetes mellitus [facility])     Patient Active Problem List   Diagnosis Date Noted  . Pacemaker 05/07/2013  . 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 CHF (congestive heart failure) ([place]) 01/31/2013  . ACUTE BRONCHITIS 02/10/2009  . SINUSITIS, ACUTE 06/17/2008  . CANDIDIASIS, ORAL 03/14/2008  . 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 Essential hypertension 09/15/2006  . ALLERGIC RHINITIS 09/15/2006  . COCAINE ABUSE, HX OF 09/15/1998    Past Surgical History:  Procedure Laterality Date  . ABDOMINAL HYSTERECTOMY  ?1980's  . BREAST BIOPSY Right    "benign"  . BREAST LUMPECTOMY Right    "benign"  . DILATION AND CURETTAGE OF UTERUS    . GANGLION CYST EXCISION Right 2003   Ganglion of wrist, volar and distal radius  . PACEMAKER INSERTION  02/04/2013   STJ Assurity dual  chamber pacemaker implanted by Dr Lovena Le for symptomatic bradycardia  . PERMANENT PACEMAKER INSERTION N/A 02/04/2013   Procedure: PERMANENT PACEMAKER INSERTION;  Surgeon: Evans Lance, MD;  Location: [facility] CATH LAB;  Service: Cardiovascular;  Laterality: N/A;  . TUBAL LIGATION      OB History   No obstetric history on file.      Home Medications    Prior to Admission medications   Medication Sig Start Date End Date Taking? Authorizing Provider  albuterol (PROVENTIL HFA;VENTOLIN HFA) 108 (90 Base) MCG/ACT inhaler Inhale 1-2 puffs into the lungs every 4 (four) hours as needed for wheezing or shortness of breath. 11/23/17   Bast, Tressia Miners A, NP  amLODipine (NORVASC) 10 MG tablet Take 10 mg by mouth daily.    [provider]  aspirin EC 81 MG tablet Take 81 mg by mouth daily.    [provider]  beclomethasone (QVAR) 80 MCG/ACT inhaler Inhale 2 puffs into the lungs 2 (two) times daily.    [provider]  celecoxib (CELEBREX) 100 MG capsule Take 100 mg by mouth 2 (two) times daily as needed (pain).     [provider]  famotidine (PEPCID) 20 MG tablet Take 1 tablet (20 mg total) by mouth daily. 09/26/15   Pisciotta, Elmyra Ricks, PA-C  fluticasone (FLONASE) 50 MCG/ACT nasal spray Place 2 sprays into both nostrils daily. 06/25/16  Doristine Devoid, PA-C  glipiZIDE (GLUCOTROL XL) 10 MG 24 hr tablet Take 20 mg by mouth daily with breakfast. 11/18/14   [provider]  guaiFENesin (MUCINEX) 600 MG 12 hr tablet Take 1 tablet (600 mg total) by mouth 2 (two) times daily. 11/23/17   Loura Halt A, NP  hydrochlorothiazide (HYDRODIURIL) 25 MG tablet Take 25 mg by mouth daily.     [provider]  ipratropium (ATROVENT) 0.06 % nasal spray Place 2 sprays into both nostrils 4 (four) times daily. Prn nasal discharge 12/20/14   Janne Napoleon, NP  losartan (COZAAR) 100 MG tablet Take 100 mg by mouth daily.    [provider]  metformin (FORTAMET) 1000 MG  (OSM) 24 hr tablet Take 1,000 mg by mouth 2 (two) times daily. 07/30/15   [provider]  metFORMIN (GLUCOPHAGE-XR) 500 MG 24 hr tablet Take 500 mg by mouth daily with breakfast.    [provider]  nystatin (MYCOSTATIN) 100000 UNIT/ML suspension Take 5 mLs (500,000 Units total) by mouth 4 (four) times daily as needed (mouth sores). 02/22/14   Melony Overly, MD  omeprazole (PRILOSEC) 20 MG capsule TAKE 1 CAPSULE (20 MG TOTAL) BY MOUTH 2 (TWO) TIMES DAILY BEFORE A MEAL. 04/24/17   Evans Lance, MD  Polyethyl Glycol-Propyl Glycol (SYSTANE OP) Place 1 drop into both eyes daily as needed (dry eyes).    [provider]  triamcinolone (KENALOG) 0.025 % cream Apply 1 application topically 2 (two) times daily. 10/14/16   Burnard Hawthorne, FNP    Family History Family History  Problem Relation Age of Onset  . Emphysema Father   . Breast cancer Mother     Social History Social History   Tobacco Use  . Smoking status: Never Smoker  . Smokeless tobacco: Never Used  Substance Use Topics  . Alcohol use: Yes    Alcohol/week: 0.0 standard drinks    Comment: 1 rarely   . Drug use: No     Allergies   Moxifloxacin and Penicillins   Review of Systems Review of Systems   Physical Exam Triage Vital Signs ED Triage Vitals  Enc Vitals Group     BP 12/20/17 1118 128/65     Pulse Rate 12/20/17 1118 70     Resp 12/20/17 1118 18     Temp 12/20/17 1118 (!) 97.3 F (36.3 C)     Temp Source 12/20/17 1118 Oral     SpO2 12/20/17 1118 100 %     Weight --      Height --      Head Circumference --      Peak Flow --      Pain Score 12/20/17 1119 2     Pain Loc --      Pain Edu? --      Excl. in [place]? --    No data found.  Updated Vital Signs BP 128/65 (BP Location: Right Arm)   Pulse 70   Temp (!) 97.3 F (36.3 C) (Oral)   Resp 18   SpO2 100%    Physical Exam Vitals signs and nursing note reviewed.  Constitutional:      Appearance: Normal appearance.    HENT:     Head: Normocephalic.     Right Ear: External ear normal.     Left Ear: External ear normal.     Mouth/Throat:     Mouth: Mucous membranes are moist.  Eyes:     Conjunctiva/sclera: Conjunctivae normal.  Neck:     Musculoskeletal: Normal range of motion and neck supple.  Cardiovascular:     Rate and Rhythm: Normal rate.     Heart sounds: Normal heart sounds.  Pulmonary:     Effort: Pulmonary effort is normal.     Breath sounds: Normal breath sounds.  Musculoskeletal: Normal range of motion.  Skin:    General: Skin is warm and dry.     Comments: Hives noted on abdomen  Neurological:     General: No focal deficit present.     Mental Status: She is alert.  Psychiatric:        Mood and Affect: Mood normal.      UC Treatments / Results  Labs (all labs ordered are listed, but only abnormal results are displayed) Labs Reviewed - No data to display  EKG None  Radiology No results found.  Procedures Procedures (including critical care time)  Medications Ordered in UC Medications - No data to display  Initial Impression / Assessment and Plan / UC Course  I have reviewed the triage vital signs and the nursing notes.  Pertinent labs & imaging results that were available during my care of the patient were reviewed by me and considered in my medical decision making (see chart for details).    Final Clinical Impressions(s) / UC Diagnoses   Final diagnoses:  None   Discharge Instructions   None    ED Prescriptions    None     Controlled Substance Prescriptions [place] Controlled Substance Registry consulted? Not Applicable   Robyn Haber, MD 12/20/17 1136

## 2017-12-20 NOTE — ED Triage Notes (Signed)
Pt here for itchy generalized rash that is intermittent

## 2018-01-01 ENCOUNTER — Other Ambulatory Visit: Payer: Self-pay

## 2018-01-01 ENCOUNTER — Ambulatory Visit (HOSPITAL_COMMUNITY)
Admission: EM | Admit: 2018-01-01 | Discharge: 2018-01-01 | Disposition: A | Discharge status: Home / Self Care | Payer: Medicare Other | Attending: Nurse Practitioner | Admitting: Nurse Practitioner

## 2018-01-01 ENCOUNTER — Encounter (HOSPITAL_COMMUNITY): Payer: Self-pay

## 2018-01-01 DIAGNOSIS — R21 Rash and other nonspecific skin eruption: Secondary | ICD-10-CM | POA: Diagnosis not present

## 2018-01-01 MED ORDER — HYDROXYZINE HCL 25 MG PO TABS: 25.0000 mg | ORAL_TABLET | Freq: Four times a day (QID) | ORAL | 0 refills | Status: DC | PRN

## 2018-01-01 MED ORDER — CLOTRIMAZOLE-BETAMETHASONE 1-0.05 % EX CREA: TOPICAL_CREAM | CUTANEOUS | 0 refills | Status: DC

## 2018-01-01 MED ORDER — METHYLPREDNISOLONE SODIUM SUCC 125 MG IJ SOLR
INTRAMUSCULAR | Status: AC
  Filled 2018-01-01: qty 2

## 2018-01-01 MED ORDER — METHYLPREDNISOLONE SODIUM SUCC 125 MG IJ SOLR
80.0000 mg | Freq: Once | INTRAMUSCULAR | Status: AC
  Administered 2018-01-01: 80 mg via INTRAMUSCULAR

## 2018-01-01 NOTE — ED Triage Notes (Signed)
Pt cc she states she has ring worm x 3 weeks.

## 2018-01-01 NOTE — ED Provider Notes (Addendum)
[facility]    CSN: [id number] Arrival date & time: 01/01/18  1311     History   Chief Complaint Chief Complaint  Patient presents with  . Rash    HPI Jessica Carpenter is a 67 y.o. female.   Subjective:   Jessica Carpenter is a 67 y.o. female who presents for evaluation of rash. Rash started several weeks ago. Initial distribution: abdomen, back and inner thighs. Lesions are purple in color and are of raised texture. Rash has changed over time.  Rash is pruritic. Associated symptoms: none. Patient denies: cough, fever and myalgia. Patient was evaluated for similar complaints here on 12/20/17. She was prescribed medrol dose pack in which she says was initially helpful but symptoms returned after completing the medication. She has been taking benadry and OTC antifungal spray with minimal relief in symptoms. Patient thinks that she may have ringworms as her grandchildren has it as well. Patient has had contacts with similar rash. Patient has not had new exposures. She specifically denies any new soaps, lotions, detergents or fragrances.   The following portions of the patient's history were reviewed and updated as appropriate: allergies, current medications, past family history, past medical history, past social history, past surgical history and problem list.              Past Medical History:  Diagnosis Date  . Allergic rhinitis   . Arthritis    "left leg" (02/04/2013)  . Asthma   . Chronic bronchitis ([place])    "usually get it q yr" (02/04/2013)  . GERD (gastroesophageal reflux disease)   . Gout    "used to; haven't had it lately" (02/04/2013)  . Hypertension   . PONV (postoperative nausea and vomiting)   . Symptomatic bradycardia    s/p STJ dual chamber pacemaker by Dr Lovena Le 02/2013  . Type II diabetes mellitus [facility])     Patient Active Problem List   Diagnosis Date Noted  . Pacemaker 05/07/2013  . 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 CHF (congestive heart failure) ([place]) 01/31/2013  . ACUTE BRONCHITIS 02/10/2009  . SINUSITIS, ACUTE 06/17/2008  . CANDIDIASIS, ORAL 03/14/2008  . 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 Essential hypertension 09/15/2006  . ALLERGIC RHINITIS 09/15/2006  . COCAINE ABUSE, HX OF 09/15/1998    Past Surgical History:  Procedure Laterality Date  . ABDOMINAL HYSTERECTOMY  ?1980's  . BREAST BIOPSY Right    "benign"  . BREAST LUMPECTOMY Right    "benign"  . DILATION AND CURETTAGE OF UTERUS    . GANGLION CYST EXCISION Right 2003   Ganglion of wrist, volar and distal radius  . PACEMAKER INSERTION  02/04/2013   STJ Assurity dual chamber pacemaker implanted by Dr Lovena Le for symptomatic bradycardia  . PERMANENT PACEMAKER INSERTION N/A 02/04/2013   Procedure: PERMANENT PACEMAKER INSERTION;  Surgeon: Evans Lance, MD;  Location: [facility] C[facility] LAB;  Service: Cardiovascular;  Laterality: N/A;  . TUBAL LIGATION      OB History   No obstetric history on file.      Home Medications    Prior to Admission medications   Medication Sig Start Date End Date Taking? Authorizing Provider  albuterol (PROVENTIL HFA;VENTOLIN HFA) 108 (90 Base) MCG/ACT inhaler Inhale 1-2 puffs into the lungs every 4 (four) hours as needed for wheezing or shortness of breath. 11/23/17   Bast, Tressia Miners A, NP  amLODipine (NORVASC) 10 MG tablet Take 10 mg by mouth daily.  [provider]  aspirin EC 81 MG tablet Take 81 mg by mouth daily.    [provider]  beclomethasone (QVAR) 80 MCG/ACT inhaler Inhale 2 puffs into the lungs 2 (two) times daily.    [provider]  celecoxib (CELEBREX) 100 MG capsule Take 100 mg by mouth 2 (two) times daily as needed (pain).     [provider]  clotrimazole-betamethasone (LOTRISONE) cream Apply to affected areas 2 times daily for two weeks 01/01/18   Enrique Sack, FNP  famotidine (PEPCID) 20 MG tablet Take 1 tablet (20 mg  total) by mouth daily. 09/26/15   Pisciotta, Elmyra Ricks, PA-C  fluticasone (FLONASE) 50 MCG/ACT nasal spray Place 2 sprays into both nostrils daily. 06/25/16   Doristine Devoid, PA-C  glipiZIDE (GLUCOTROL XL) 10 MG 24 hr tablet Take 20 mg by mouth daily with breakfast. 11/18/14   [provider]  guaiFENesin (MUCINEX) 600 MG 12 hr tablet Take 1 tablet (600 mg total) by mouth 2 (two) times daily. 11/23/17   Loura Halt A, NP  hydrochlorothiazide (HYDRODIURIL) 25 MG tablet Take 25 mg by mouth daily.     [provider]  hydrOXYzine (ATARAX/VISTARIL) 25 MG tablet Take 1 tablet (25 mg total) by mouth every 6 (six) hours as needed for itching. 01/01/18   Enrique Sack, FNP  ipratropium (ATROVENT) 0.06 % nasal spray Place 2 sprays into both nostrils 4 (four) times daily. Prn nasal discharge 12/20/14   Janne Napoleon, NP  losartan (COZAAR) 100 MG tablet Take 100 mg by mouth daily.    [provider]  metformin (FORTAMET) 1000 MG (OSM) 24 hr tablet Take 1,000 mg by mouth 2 (two) times daily. 07/30/15   [provider]  metFORMIN (GLUCOPHAGE-XR) 500 MG 24 hr tablet Take 500 mg by mouth daily with breakfast.    [provider]  methylPREDNISolone (MEDROL) 4 MG tablet Take 1 tablet (4 mg total) by mouth 2 (two) times daily. 12/20/17   Robyn Haber, MD  nystatin (MYCOSTATIN) 100000 UNIT/ML suspension Take 5 mLs (500,000 Units total) by mouth 4 (four) times daily as needed (mouth sores). 02/22/14   Melony Overly, MD  omeprazole (PRILOSEC) 20 MG capsule TAKE 1 CAPSULE (20 MG TOTAL) BY MOUTH 2 (TWO) TIMES DAILY BEFORE A MEAL. 04/24/17   Evans Lance, MD  Polyethyl Glycol-Propyl Glycol (SYSTANE OP) Place 1 drop into both eyes daily as needed (dry eyes).    [provider]  triamcinolone (KENALOG) 0.025 % cream Apply 1 application topically 2 (two) times daily. 10/14/16   Burnard Hawthorne, FNP    Family History Family History  Problem Relation Age of Onset    . Emphysema Father   . Breast cancer Mother     Social History Social History   Tobacco Use  . Smoking status: Never Smoker  . Smokeless tobacco: Never Used  Substance Use Topics  . Alcohol use: Yes    Alcohol/week: 0.0 standard drinks    Comment: 1 rarely   . Drug use: No     Allergies   Moxifloxacin and Penicillins   Review of Systems Review of Systems  Constitutional: Negative for chills and fever.  Gastrointestinal: Negative.   Skin: Positive for rash.  All other systems reviewed and are negative.    Physical Exam Triage Vital Signs ED Triage Vitals  Enc Vitals Group     BP 01/01/18 1503 (!) 150/80     Pulse Rate 01/01/18 1503 84     Resp  01/01/18 1503 18     Temp 01/01/18 1503 98 F (36.7 C)     Temp src --      SpO2 01/01/18 1503 (!) 64 %     Weight 01/01/18 1459 186 lb (84.4 kg)     Height --      Head Circumference --      Peak Flow --      Pain Score 01/01/18 1459 2     Pain Loc --      Pain Edu? --      Excl. in [place]? --    No data found.  Updated Vital Signs BP (!) 150/80 (BP Location: Right Arm)   Pulse 84   Temp 98 F (36.7 C)   Resp 18   Wt 186 lb (84.4 kg)   SpO2 94%   BMI 31.93 kg/m   Visual Acuity Right Eye Distance:   Left Eye Distance:   Bilateral Distance:    Right Eye Near:   Left Eye Near:    Bilateral Near:     Physical Exam Constitutional:      General: She is not in acute distress.    Appearance: Normal appearance. She is not ill-appearing or toxic-appearing.  Neck:     Musculoskeletal: Normal range of motion.  Cardiovascular:     Rate and Rhythm: Normal rate.  Pulmonary:     Effort: Pulmonary effort is normal.  Musculoskeletal: Normal range of motion.  Skin:    General: Skin is warm and dry.     Comments: Multiple erythematous rash noted to the back, inner thighs lower abdomen area.  No plaques or scaling noted.  No open areas or drainage noted.  No edema.    Neurological:     General: No focal deficit  present.     Mental Status: She is alert and oriented to person, place, and time.      UC Treatments / Results  Labs (all labs ordered are listed, but only abnormal results are displayed) Labs Reviewed - No data to display  EKG None  Radiology No results found.  Procedures Procedures (including critical care time)  Medications Ordered in UC Medications  methylPREDNISolone sodium succinate (SOLU-MEDROL) 125 mg/2 mL injection 80 mg (80 mg Intramuscular Given 01/01/18 1555)    Initial Impression / Assessment and Plan / UC Course  I have reviewed the triage vital signs and the nursing notes.  Pertinent labs & imaging results that were available during my care of the patient were reviewed by me and considered in my medical decision making (see chart for details).     67 year old female presenting with a diffused pruritic rash to her back, abdomen and inner thigh area.  Patient was evaluated here for similar complaints on 12/20/2017 and prescribed a Medrol Dosepak which is been ineffective in treating her symptoms.  She thinks that she may have ringworms as her grandchildren has it as well.  Low suspicion for ringworms; however, since she did not improve with steroids, will try Lotrisone cream twice daily for 2 weeks.  We also will give her an injection of steroids in the clinic.  Patient advised to avoid any products with fragrances until symptoms resolve.  Follow-up with dermatology if no improvement in symptoms.  Agreeable.  Today's evaluation has revealed no signs of a dangerous process. Discussed diagnosis with patient. Patient aware of their diagnosis, possible red flag symptoms to watch out for and need for close follow up. Patient understands verbal and written discharge  instructions. Patient comfortable with plan and disposition.  Patient has a clear mental status at this time, good insight into illness (after discussion and teaching) and has clear judgment to make decisions  regarding their care.  Documentation was completed with the aid of voice recognition software. Transcription may contain typographical errors. Final Clinical Impressions(s) / UC Diagnoses   Final diagnoses:  Rash and nonspecific skin eruption     Discharge Instructions     You were given a shot of steroids here in the clinic. Apply the prescribed cream to affected areas twice daily x 2 weeks. Take the hydroxyzine every 6 hours as needed for itching. Use fragrance free products on your skin until your symptoms resolve. Follow-up with dermatology if no improvement or resolution of symptoms.     ED Prescriptions    Medication Sig Dispense Auth. Provider   clotrimazole-betamethasone (LOTRISONE) cream Apply to affected areas 2 times daily for two weeks 15 g Enrique Sack, FNP   hydrOXYzine (ATARAX/VISTARIL) 25 MG tablet Take 1 tablet (25 mg total) by mouth every 6 (six) hours as needed for itching. 12 tablet Enrique Sack, FNP     Controlled Substance Prescriptions [place] Controlled Substance Registry consulted? Not Applicable   Enrique Sack, [place] 01/01/18 [place], [place], [place] 01/01/18 [phone]

## 2018-01-01 NOTE — Discharge Instructions (Addendum)
You were given a shot of steroids here in the clinic. Apply the prescribed cream to affected areas twice daily x 2 weeks. Take the hydroxyzine every 6 hours as needed for itching. Use fragrance free products on your skin until your symptoms resolve. Follow-up with dermatology if no improvement or resolution of symptoms.

## 2018-01-07 LAB — CUP PACEART REMOTE DEVICE CHECK
Battery Remaining Longevity: 126 mo
Battery Voltage: 2.99 V
Brady Statistic AP VP Percent: 43 %
Brady Statistic AP VS Percent: 1 %
Brady Statistic AS VS Percent: 1 %
Date Time Interrogation Session: 20191104060019
Implantable Lead Location: 753859
Implantable Lead Location: 753860
Implantable Pulse Generator Implant Date: 20150202
Lead Channel Impedance Value: 440 Ohm
Lead Channel Pacing Threshold Amplitude: 0.5 V
Lead Channel Pacing Threshold Pulse Width: 0.4 ms
Lead Channel Sensing Intrinsic Amplitude: 6 mV
Lead Channel Setting Pacing Amplitude: 0.75 V
Lead Channel Setting Pacing Amplitude: 2 V
Lead Channel Setting Pacing Pulse Width: 0.4 ms
MDC IDC LEAD IMPLANT DT: 20150202
MDC IDC LEAD IMPLANT DT: 20150202
MDC IDC MSMT BATTERY REMAINING PERCENTAGE: 95.5 %
MDC IDC MSMT LEADCHNL RA PACING THRESHOLD AMPLITUDE: 0.75 V
MDC IDC MSMT LEADCHNL RA PACING THRESHOLD PULSEWIDTH: 0.4 ms
MDC IDC MSMT LEADCHNL RA SENSING INTR AMPL: 1.5 mV
MDC IDC MSMT LEADCHNL RV IMPEDANCE VALUE: 480 Ohm
MDC IDC PG SERIAL: 7588973
MDC IDC SET LEADCHNL RV SENSING SENSITIVITY: 2 mV
MDC IDC STAT BRADY AS VP PERCENT: 57 %
MDC IDC STAT BRADY RA PERCENT PACED: 43 %
MDC IDC STAT BRADY RV PERCENT PACED: 99 %
Pulse Gen Model: 2240

## 2018-01-27 ENCOUNTER — Encounter (HOSPITAL_COMMUNITY): Payer: Self-pay | Admitting: Emergency Medicine

## 2018-01-27 ENCOUNTER — Emergency Department (HOSPITAL_COMMUNITY)
Admission: EM | Admit: 2018-01-27 | Discharge: 2018-01-27 | Disposition: A | Discharge status: Home / Self Care | Payer: Medicare Other | Attending: Emergency Medicine | Admitting: Emergency Medicine

## 2018-01-27 DIAGNOSIS — Z7982 Long term (current) use of aspirin: Secondary | ICD-10-CM | POA: Diagnosis not present

## 2018-01-27 DIAGNOSIS — R21 Rash and other nonspecific skin eruption: Secondary | ICD-10-CM

## 2018-01-27 DIAGNOSIS — Z79899 Other long term (current) drug therapy: Secondary | ICD-10-CM | POA: Insufficient documentation

## 2018-01-27 DIAGNOSIS — Z950 Presence of cardiac pacemaker: Secondary | ICD-10-CM | POA: Insufficient documentation

## 2018-01-27 DIAGNOSIS — L2082 Flexural eczema: Secondary | ICD-10-CM | POA: Insufficient documentation

## 2018-01-27 DIAGNOSIS — I509 Heart failure, unspecified: Secondary | ICD-10-CM | POA: Insufficient documentation

## 2018-01-27 DIAGNOSIS — I110 Hypertensive heart disease with heart failure: Secondary | ICD-10-CM | POA: Insufficient documentation

## 2018-01-27 DIAGNOSIS — E119 Type 2 diabetes mellitus without complications: Secondary | ICD-10-CM | POA: Diagnosis not present

## 2018-01-27 DIAGNOSIS — L509 Urticaria, unspecified: Secondary | ICD-10-CM | POA: Diagnosis present

## 2018-01-27 MED ORDER — RANITIDINE HCL 150 MG PO TABS: 150.0000 mg | ORAL_TABLET | Freq: Two times a day (BID) | ORAL | 0 refills | Status: DC

## 2018-01-27 MED ORDER — TRIAMCINOLONE ACETONIDE 0.1 % EX CREA: 1.0000 "application " | TOPICAL_CREAM | Freq: Two times a day (BID) | CUTANEOUS | 0 refills | Status: DC | PRN

## 2018-01-27 MED ORDER — METHYLPREDNISOLONE SODIUM SUCC 125 MG IJ SOLR
125.0000 mg | Freq: Once | INTRAMUSCULAR | Status: AC
  Administered 2018-01-27: 125 mg via INTRAMUSCULAR
  Filled 2018-01-27: qty 2

## 2018-01-27 MED ORDER — HYDROXYZINE HCL 25 MG PO TABS: 25.0000 mg | ORAL_TABLET | Freq: Four times a day (QID) | ORAL | 0 refills | Status: DC | PRN

## 2018-01-27 MED ORDER — FAMOTIDINE 20 MG PO TABS
20.0000 mg | ORAL_TABLET | Freq: Once | ORAL | Status: AC
  Administered 2018-01-27: 20 mg via ORAL
  Filled 2018-01-27: qty 1

## 2018-01-27 NOTE — ED Notes (Signed)
Patient verbalizes understanding of discharge instructions. Opportunity for questioning and answers were provided. 

## 2018-01-27 NOTE — ED Provider Notes (Signed)
[facility] EMERGENCY DEPARTMENT Provider Note   CSN: [id number] Arrival date & time: 01/27/18  1014     History   Chief Complaint Chief Complaint  Patient presents with  . Allergic Reaction    HPI Jessica Carpenter is a 68 y.o. female with a PMHx of allergic rhinitis, asthma, GERD, gout, HTN, DM2, and other conditions listed below, who presents to the ED with complaints of hives that have been going on for about a month.  She states that they come and go, mostly in the creases of her arms, legs, abdomen, and now she started having some on her left cheek and reports that her left cheek and left lower lip are somewhat swollen.  She was seen at urgent care on 01/20/2017 and was given a Medrol Dosepak which helped some.  She was seen again at urgent care on 01/01/2018 and was given IM Solu-Medrol 80 mg, then prescribed Vistaril and clotrimazole-betamethasone cream.  She states that the Vistaril and the Solu-Medrol shot helped but she is out of the Vistaril now.  The clotrimazole-betamethasone cream did not help any.  No known aggravating factors.  She tried changing detergents since starting to have this rash that has not helped.  She denies any recent changes in medications, soaps, lotions, creams, animal or plant contact, or exposure to similar rashes.  She denies having any difficulty swallowing or breathing, chest pain, shortness of breath, wheezing, drooling, trismus, abdominal pain, nausea, vomiting, diarrhea, or any other complaints at this time.  The history is provided by the patient and medical records. No language interpreter was used.  Allergic Reaction  Presenting symptoms: rash   Presenting symptoms: no difficulty swallowing and no wheezing     Past Medical History:  Diagnosis Date  . Allergic rhinitis   . Arthritis    "left leg" (02/04/2013)  . Asthma   . Chronic bronchitis ([place])    "usually get it q yr" (02/04/2013)  . GERD (gastroesophageal reflux disease)     . Gout    "used to; haven't had it lately" (02/04/2013)  . Hypertension   . PONV (postoperative nausea and vomiting)   . Symptomatic bradycardia    s/p STJ dual chamber pacemaker by Dr Lovena Le 02/2013  . Type II diabetes mellitus [facility])     Patient Active Problem List   Diagnosis Date Noted  . Pacemaker 05/07/2013  . 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 CHF (congestive heart failure) ([place]) 01/31/2013  . ACUTE BRONCHITIS 02/10/2009  . SINUSITIS, ACUTE 06/17/2008  . CANDIDIASIS, ORAL 03/14/2008  . 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 Essential hypertension 09/15/2006  . ALLERGIC RHINITIS 09/15/2006  . COCAINE ABUSE, HX OF 09/15/1998    Past Surgical History:  Procedure Laterality Date  . ABDOMINAL HYSTERECTOMY  ?1980's  . BREAST BIOPSY Right    "benign"  . BREAST LUMPECTOMY Right    "benign"  . DILATION AND CURETTAGE OF UTERUS    . GANGLION CYST EXCISION Right 2003   Ganglion of wrist, volar and distal radius  . PACEMAKER INSERTION  02/04/2013   STJ Assurity dual chamber pacemaker implanted by Dr Lovena Le for symptomatic bradycardia  . PERMANENT PACEMAKER INSERTION N/A 02/04/2013   Procedure: PERMANENT PACEMAKER INSERTION;  Surgeon: Evans Lance, MD;  Location: [facility] CATH LAB;  Service: Cardiovascular;  Laterality: N/A;  . TUBAL LIGATION       OB History   No obstetric history on file.  Home Medications    Prior to Admission medications   Medication Sig Start Date End Date Taking? Authorizing Provider  albuterol (PROVENTIL HFA;VENTOLIN HFA) 108 (90 Base) MCG/ACT inhaler Inhale 1-2 puffs into the lungs every 4 (four) hours as needed for wheezing or shortness of breath. 11/23/17  Yes Bast, Traci A, NP  amLODipine (NORVASC) 10 MG tablet Take 10 mg by mouth daily.   Yes [provider]  aspirin EC 81 MG tablet Take 81 mg by mouth daily.   Yes [provider]  Budesonide (PULMICORT FLEXHALER) 90 MCG/ACT  inhaler Inhale 2 puffs into the lungs 2 (two) times daily.   Yes [provider]  celecoxib (CELEBREX) 100 MG capsule Take 100 mg by mouth 2 (two) times daily as needed (pain).    Yes [provider]  fluticasone (FLONASE) 50 MCG/ACT nasal spray Place 2 sprays into both nostrils daily. 06/25/16  Yes Ocie Cornfield T, PA-C  glipiZIDE (GLUCOTROL XL) 10 MG 24 hr tablet Take 20 mg by mouth daily with breakfast. 11/18/14  Yes [provider]  hydrochlorothiazide (HYDRODIURIL) 25 MG tablet Take 25 mg by mouth daily.    Yes [provider]  hydrOXYzine (ATARAX/VISTARIL) 25 MG tablet Take 1 tablet (25 mg total) by mouth every 6 (six) hours as needed for itching. 01/01/18  Yes Enrique Sack, FNP  losartan (COZAAR) 100 MG tablet Take 100 mg by mouth daily.   Yes [provider]  metformin (FORTAMET) 1000 MG (OSM) 24 hr tablet Take 1,000 mg by mouth 2 (two) times daily. 07/30/15  Yes [provider]  methylPREDNISolone (MEDROL) 4 MG tablet Take 1 tablet (4 mg total) by mouth 2 (two) times daily. 12/20/17  Yes Robyn Haber, MD  omeprazole (PRILOSEC) 20 MG capsule TAKE 1 CAPSULE (20 MG TOTAL) BY MOUTH 2 (TWO) TIMES DAILY BEFORE A MEAL. 04/24/17  Yes Evans Lance, MD  Polyethyl Glycol-Propyl Glycol (SYSTANE OP) Place 1 drop into both eyes daily as needed (dry eyes).   Yes [provider]  clotrimazole-betamethasone (LOTRISONE) cream Apply to affected areas 2 times daily for two weeks Patient taking differently: Apply 1 application topically 2 (two) times daily as needed (Fungal infection). Apply to affected areas 2 times daily for two weeks if needed for fungal infection 01/01/18   Enrique Sack, FNP  guaiFENesin (MUCINEX) 600 MG 12 hr tablet Take 1 tablet (600 mg total) by mouth 2 (two) times daily. Patient not taking: Reported on 01/27/2018 11/23/17   Loura Halt A, NP  ipratropium (ATROVENT) 0.06 % nasal spray Place 2 sprays into both  nostrils 4 (four) times daily. Prn nasal discharge Patient not taking: Reported on 01/27/2018 12/20/14   Janne Napoleon, NP  nystatin (MYCOSTATIN) 100000 UNIT/ML suspension Take 5 mLs (500,000 Units total) by mouth 4 (four) times daily as needed (mouth sores). Patient not taking: Reported on 01/27/2018 02/22/14   Melony Overly, MD  triamcinolone (KENALOG) 0.025 % cream Apply 1 application topically 2 (two) times daily. Patient not taking: Reported on 01/27/2018 10/14/16   Burnard Hawthorne, FNP    Family History Family History  Problem Relation Age of Onset  . Emphysema Father   . Breast cancer Mother     Social History Social History   Tobacco Use  . Smoking status: Never Smoker  . Smokeless tobacco: Never Used  Substance Use Topics  . Alcohol use: Yes    Alcohol/week: 0.0 standard drinks    Comment: 1 rarely   . Drug use:  No     Allergies   Moxifloxacin and Penicillins   Review of Systems Review of Systems  HENT: Positive for facial swelling. Negative for trouble swallowing.   Respiratory: Negative for shortness of breath and wheezing.   Cardiovascular: Negative for chest pain.  Gastrointestinal: Negative for abdominal pain, diarrhea, nausea and vomiting.  Skin: Positive for rash.  Allergic/Immunologic: Positive for immunocompromised state (DM2).   All other systems reviewed and are negative for acute change except as noted in the HPI.    Physical Exam Updated Vital Signs BP (!) 125/100 (BP Location: Right Arm)   Pulse 75   Temp 97.6 F (36.4 C) (Oral)   Resp 18   SpO2 100%   Physical Exam Vitals signs and nursing note reviewed.  Constitutional:      General: She is not in acute distress.    Appearance: Normal appearance. She is well-developed. She is not toxic-appearing.     Comments: Afebrile, nontoxic, NAD  HENT:     Head: Normocephalic and atraumatic.     Mouth/Throat:     Pharynx: Oropharynx is clear. Uvula midline. No pharyngeal swelling, oropharyngeal  exudate, posterior oropharyngeal erythema or uvula swelling.     Tonsils: No tonsillar exudate or tonsillar abscesses.     Comments: Minimal swelling/puffiness to the L cheek without overlying skin findings. No lip or tongue swelling, no trismus or drooling, handling secretions well.  Eyes:     General:        Right eye: No discharge.        Left eye: No discharge.     Conjunctiva/sclera: Conjunctivae normal.  Neck:     Musculoskeletal: Normal range of motion and neck supple.  Cardiovascular:     Rate and Rhythm: Normal rate.     Pulses: Normal pulses.  Pulmonary:     Effort: Pulmonary effort is normal. No respiratory distress.  Abdominal:     General: There is no distension.  Musculoskeletal: Normal range of motion.  Skin:    General: Skin is warm and dry.     Findings: Rash present. Rash is urticarial.     Comments: Urticarial type lesions to the antecubital fossas bilaterally, no other areas of rash to remainder of exposed surfaces including the abdomen. No evidence of scabies or secondary fungal/bacterial infections.   Neurological:     Mental Status: She is alert and oriented to person, place, and time.     Sensory: Sensation is intact. No sensory deficit.     Motor: Motor function is intact.  Psychiatric:        Mood and Affect: Mood and affect normal.        Behavior: Behavior normal.      ED Treatments / Results  Labs (all labs ordered are listed, but only abnormal results are displayed) Labs Reviewed - No data to display  EKG None  Radiology No results found.  Procedures Procedures (including critical care time)  Medications Ordered in ED Medications  famotidine (PEPCID) tablet 20 mg (20 mg Oral Given 01/27/18 1252)  methylPREDNISolone sodium succinate (SOLU-MEDROL) 125 mg/2 mL injection 125 mg (125 mg Intramuscular Given 01/27/18 1251)     Initial Impression / Assessment and Plan / ED Course  I have reviewed the triage vital signs and the nursing  notes.  Pertinent labs & imaging results that were available during my care of the patient were reviewed by me and considered in my medical decision making (see chart for details).     [place]  y.o. female here with hives/rash x 1 month.  Has been seen at urgent care twice, prescribed Medrol first and then Vistaril with clotrimazole betamethasone cream.  Seems to be in the flexural surfaces, mostly in the arms, skin fold of her abdomen, and now she has some on the left side of her face.  On exam, no lip swelling, minimal swelling to the left cheek, with no hives or rash noted to the cheek, with some urticarial type lesions to the antecubital fossas bilaterally.  Discussed that this could be eczema given that it only seems to be in the flexural surfaces, doubt scabies or fungal infection, doubt bacterial infection.  Patient wanting to repeat the steroid shot here, states that that helped before, will do that now, will send home with Vistaril, triamcinolone cream, and Zantac.  Advised avoidance of triggers.  Follow-up with PCP in 1 week. I explained the diagnosis and have given explicit precautions to return to the ER including for any other new or worsening symptoms. The patient understands and accepts the medical plan as it's been dictated and I have answered their questions. Discharge instructions concerning home care and prescriptions have been given. The patient is STABLE and is discharged to home in good condition.    Final Clinical Impressions(s) / ED Diagnoses   Final diagnoses:  Rash  Flexural eczema    ED Discharge Orders         Ordered    hydrOXYzine (ATARAX/VISTARIL) 25 MG tablet  Every 6 hours PRN     01/27/18 1306    ranitidine (ZANTAC) 150 MG tablet  2 times daily     01/27/18 1306    triamcinolone cream (KENALOG) 0.1 %  2 times daily PRN     01/27/18 [address], [place], [place] 01/27/18 1312    Gareth Morgan, MD 01/30/18 2202

## 2018-01-27 NOTE — Discharge Instructions (Signed)
Take vistaril and zantac as directed. Use triamcinolone cream as prescribed, but never on the face.  Continue your usual home medications. Get plenty of rest and drink plenty of fluids. Avoid any known triggers. Please followup with your primary doctor in 1 week for recheck of symptoms. Return to the ER for changes or worsening symptoms

## 2018-01-27 NOTE — ED Triage Notes (Signed)
Pt here for eval of allergic reactions starting over 3 weeks ago. She does not know what it is to. She has rash with swelling to her lip, was seen at urgent care and took PO steroids with no relief.

## 2018-01-28 ENCOUNTER — Encounter (HOSPITAL_COMMUNITY): Payer: Self-pay | Admitting: *Deleted

## 2018-01-28 ENCOUNTER — Ambulatory Visit (HOSPITAL_COMMUNITY)
Admission: EM | Admit: 2018-01-28 | Discharge: 2018-01-28 | Disposition: A | Discharge status: Home / Self Care | Payer: Medicare Other | Attending: Family Medicine | Admitting: Family Medicine

## 2018-01-28 ENCOUNTER — Other Ambulatory Visit: Payer: Self-pay

## 2018-01-28 DIAGNOSIS — J320 Chronic maxillary sinusitis: Secondary | ICD-10-CM | POA: Diagnosis not present

## 2018-01-28 MED ORDER — DOXYCYCLINE HYCLATE 100 MG PO CAPS: 100.0000 mg | ORAL_CAPSULE | Freq: Two times a day (BID) | ORAL | 0 refills | Status: DC

## 2018-01-28 NOTE — ED Provider Notes (Signed)
[place]    CSN: [id number] Arrival date & time: 01/28/18  1415     History   Chief Complaint Chief Complaint  Patient presents with  . Facial Pain    HPI Jessica Carpenter is a 68 y.o. female.   Patient complains of some left-sided facial pain postnasal drainage headache.  She has a history of chronic bronchitis and asthma.  HPI  Past Medical History:  Diagnosis Date  . Allergic rhinitis   . Arthritis    "left leg" (02/04/2013)  . Asthma   . Chronic bronchitis ([place])    "usually get it q yr" (02/04/2013)  . GERD (gastroesophageal reflux disease)   . Gout    "used to; haven't had it lately" (02/04/2013)  . Hypertension   . Pacemaker   . PONV (postoperative nausea and vomiting)   . Symptomatic bradycardia    s/p STJ dual chamber pacemaker by Dr Lovena Le 02/2013  . Type II diabetes mellitus [facility])     Patient Active Problem List   Diagnosis Date Noted  . Pacemaker 05/07/2013  . 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 CHF (congestive heart failure) ([address]) 01/31/2013  . ACUTE BRONCHITIS 02/10/2009  . SINUSITIS, ACUTE 06/17/2008  . CANDIDIASIS, ORAL 03/14/2008  . 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 Essential hypertension 09/15/2006  . ALLERGIC RHINITIS 09/15/2006  . COCAINE ABUSE, HX OF 09/15/1998    Past Surgical History:  Procedure Laterality Date  . ABDOMINAL HYSTERECTOMY  ?1980's  . BREAST BIOPSY Right    "benign"  . BREAST LUMPECTOMY Right    "benign"  . DILATION AND CURETTAGE OF UTERUS    . GANGLION CYST EXCISION Right 2003   Ganglion of wrist, volar and distal radius  . PACEMAKER INSERTION  02/04/2013   STJ Assurity dual chamber pacemaker implanted by Dr Lovena Le for symptomatic bradycardia  . PERMANENT PACEMAKER INSERTION N/A 02/04/2013   Procedure: PERMANENT PACEMAKER INSERTION;  Surgeon: Evans Lance, MD;  Location: [facility] CATH LAB;  Service: Cardiovascular;  Laterality: N/A;  . TUBAL LIGATION       OB History   No obstetric history on file.      Home Medications    Prior to Admission medications   Medication Sig Start Date End Date Taking? Authorizing Provider  albuterol (PROVENTIL HFA;VENTOLIN HFA) 108 (90 Base) MCG/ACT inhaler Inhale 1-2 puffs into the lungs every 4 (four) hours as needed for wheezing or shortness of breath. 11/23/17  Yes Bast, Traci A, NP  amLODipine (NORVASC) 10 MG tablet Take 10 mg by mouth daily.   Yes [provider]  aspirin EC 81 MG tablet Take 81 mg by mouth daily.   Yes [provider]  Budesonide (PULMICORT FLEXHALER) 90 MCG/ACT inhaler Inhale 2 puffs into the lungs 2 (two) times daily.   Yes [provider]  celecoxib (CELEBREX) 100 MG capsule Take 100 mg by mouth 2 (two) times daily as needed (pain).    Yes [provider]  clotrimazole-betamethasone (LOTRISONE) cream Apply to affected areas 2 times daily for two weeks Patient taking differently: Apply 1 application topically 2 (two) times daily as needed (Fungal infection). Apply to affected areas 2 times daily for two weeks if needed for fungal infection 01/01/18  Yes Murrill, Aldona Bar, FNP  fluticasone (FLONASE) 50 MCG/ACT nasal spray Place 2 sprays into both nostrils daily. 06/25/16  Yes Leaphart, Zack Seal, PA-C  glipiZIDE (GLUCOTROL XL) 10 MG 24 hr tablet Take 20 mg by  mouth daily with breakfast. 11/18/14  Yes [provider]  hydrochlorothiazide (HYDRODIURIL) 25 MG tablet Take 25 mg by mouth daily.    Yes [provider]  hydrOXYzine (ATARAX/VISTARIL) 25 MG tablet Take 1 tablet (25 mg total) by mouth every 6 (six) hours as needed for itching. 01/27/18  Yes Street, [place], PA-C  losartan (COZAAR) 100 MG tablet Take 100 mg by mouth daily.   Yes [provider]  metformin (FORTAMET) 1000 MG (OSM) 24 hr tablet Take 1,000 mg by mouth 2 (two) times daily. 07/30/15  Yes [provider]  omeprazole (PRILOSEC) 20 MG capsule TAKE 1  CAPSULE (20 MG TOTAL) BY MOUTH 2 (TWO) TIMES DAILY BEFORE A MEAL. 04/24/17  Yes Evans Lance, MD  guaiFENesin (MUCINEX) 600 MG 12 hr tablet Take 1 tablet (600 mg total) by mouth 2 (two) times daily. Patient not taking: Reported on 01/27/2018 11/23/17   Loura Halt A, NP  hydrOXYzine (ATARAX/VISTARIL) 25 MG tablet Take 1 tablet (25 mg total) by mouth every 6 (six) hours as needed for itching. 01/01/18   Enrique Sack, FNP  ipratropium (ATROVENT) 0.06 % nasal spray Place 2 sprays into both nostrils 4 (four) times daily. Prn nasal discharge Patient not taking: Reported on 01/27/2018 12/20/14   Janne Napoleon, NP  methylPREDNISolone (MEDROL) 4 MG tablet Take 1 tablet (4 mg total) by mouth 2 (two) times daily. 12/20/17   Robyn Haber, MD  nystatin (MYCOSTATIN) 100000 UNIT/ML suspension Take 5 mLs (500,000 Units total) by mouth 4 (four) times daily as needed (mouth sores). Patient not taking: Reported on 01/27/2018 02/22/14   Melony Overly, MD  Polyethyl Glycol-Propyl Glycol (SYSTANE OP) Place 1 drop into both eyes daily as needed (dry eyes).    [provider]  ranitidine (ZANTAC) 150 MG tablet Take 1 tablet (150 mg total) by mouth 2 (two) times daily. 01/27/18   Street, Mercedes, PA-C  triamcinolone (KENALOG) 0.025 % cream Apply 1 application topically 2 (two) times daily. Patient not taking: Reported on 01/27/2018 10/14/16   Burnard Hawthorne, FNP  triamcinolone cream (KENALOG) 0.1 % Apply 1 application topically 2 (two) times daily as needed. For itching 01/27/18   Street, [place], PA-C    Family History Family History  Problem Relation Age of Onset  . Emphysema Father   . Breast cancer Mother     Social History Social History   Tobacco Use  . Smoking status: Never Smoker  . Smokeless tobacco: Never Used  Substance Use Topics  . Alcohol use: Yes    Comment: rarely  . Drug use: No     Allergies   Moxifloxacin; Penicillins; and Arimidex [anastrozole]   Review of  Systems Review of Systems  Constitutional: Negative.   HENT: Positive for congestion, postnasal drip and sinus pressure.   Respiratory: Positive for cough.   Cardiovascular: Negative.   Gastrointestinal: Negative.   Psychiatric/Behavioral: Negative.      Physical Exam Triage Vital Signs ED Triage Vitals  Enc Vitals Group     BP 01/28/18 1524 (!) 141/72     Pulse Rate 01/28/18 1524 88     Resp 01/28/18 1524 18     Temp 01/28/18 1524 97.8 F (36.6 C)     Temp Source 01/28/18 1524 Oral     SpO2 01/28/18 1524 100 %     Weight --      Height --      Head Circumference --      Peak Flow --  Pain Score 01/28/18 1529 8     Pain Loc --      Pain Edu? --      Excl. in [place]? --    No data found.  Updated Vital Signs BP (!) 141/72 (BP Location: Left Arm)   Pulse 88   Temp 97.8 F (36.6 C) (Oral)   Resp 18   SpO2 100%   Visual Acuity Right Eye Distance:   Left Eye Distance:   Bilateral Distance:    Right Eye Near:   Left Eye Near:    Bilateral Near:     Physical Exam Nursing note reviewed.  Constitutional:      Appearance: Normal appearance. She is normal weight.  HENT:     Head: Normocephalic.     Nose: Nose normal.     Mouth/Throat:     Mouth: Mucous membranes are dry.     Pharynx: Oropharynx is clear.  Cardiovascular:     Rate and Rhythm: Normal rate and regular rhythm.  Pulmonary:     Effort: Pulmonary effort is normal.     Breath sounds: Normal breath sounds.  Neurological:     General: No focal deficit present.     Mental Status: She is alert and oriented to person, place, and time.      UC Treatments / Results  Labs (all labs ordered are listed, but only abnormal results are displayed) Labs Reviewed - No data to display  EKG None  Radiology No results found.  Procedures Procedures (including critical care time)  Medications Ordered in UC Medications - No data to display  Initial Impression / Assessment and Plan / UC Course  I have  reviewed the triage vital signs and the nursing notes.  Pertinent labs & imaging results that were available during my care of the patient were reviewed by me and considered in my medical decision making (see chart for details).     Sinusitis.  Will treat with doxycycline Mucinex Final Clinical Impressions(s) / UC Diagnoses   Final diagnoses:  None   Discharge Instructions   None    ED Prescriptions    None     Controlled Substance Prescriptions [place] Controlled Substance Registry consulted? No   Wardell Honour, MD 01/28/18 [phone]

## 2018-01-28 NOTE — ED Triage Notes (Signed)
C/O left sinus pressure and HA x couple days.  Denies fevers.

## 2018-02-05 ENCOUNTER — Ambulatory Visit (INDEPENDENT_AMBULATORY_CARE_PROVIDER_SITE_OTHER): Payer: Medicare Other

## 2018-02-05 DIAGNOSIS — I442 Atrioventricular block, complete: Secondary | ICD-10-CM | POA: Diagnosis not present

## 2018-02-05 DIAGNOSIS — I10 Essential (primary) hypertension: Secondary | ICD-10-CM

## 2018-02-08 LAB — CUP PACEART REMOTE DEVICE CHECK
Battery Voltage: 2.99 V
Brady Statistic AP VP Percent: 41 %
Brady Statistic AP VS Percent: 1 %
Brady Statistic AS VP Percent: 59 %
Brady Statistic RA Percent Paced: 41 %
Brady Statistic RV Percent Paced: 99 %
Implantable Lead Implant Date: 20150202
Implantable Lead Location: 753859
Implantable Lead Location: 753860
Lead Channel Impedance Value: 460 Ohm
Lead Channel Pacing Threshold Amplitude: 0.625 V
Lead Channel Pacing Threshold Pulse Width: 0.4 ms
Lead Channel Sensing Intrinsic Amplitude: 5.4 mV
Lead Channel Setting Pacing Amplitude: 2 V
Lead Channel Setting Pacing Pulse Width: 0.4 ms
MDC IDC LEAD IMPLANT DT: 20150202
MDC IDC MSMT BATTERY REMAINING LONGEVITY: 125 mo
MDC IDC MSMT BATTERY REMAINING PERCENTAGE: 95.5 %
MDC IDC MSMT LEADCHNL RA IMPEDANCE VALUE: 400 Ohm
MDC IDC MSMT LEADCHNL RA PACING THRESHOLD AMPLITUDE: 0.75 V
MDC IDC MSMT LEADCHNL RA SENSING INTR AMPL: 1.8 mV
MDC IDC MSMT LEADCHNL RV PACING THRESHOLD PULSEWIDTH: 0.4 ms
MDC IDC PG IMPLANT DT: 20150202
MDC IDC SESS DTM: 20200203121805
MDC IDC SET LEADCHNL RV PACING AMPLITUDE: 0.875
MDC IDC SET LEADCHNL RV SENSING SENSITIVITY: 2 mV
MDC IDC STAT BRADY AS VS PERCENT: 1 %
Pulse Gen Model: 2240
Pulse Gen Serial Number: 7588973

## 2018-02-14 NOTE — Progress Notes (Signed)
Remote pacemaker transmission.   

## 2018-03-06 ENCOUNTER — Encounter (INDEPENDENT_AMBULATORY_CARE_PROVIDER_SITE_OTHER): Payer: Self-pay

## 2018-03-06 ENCOUNTER — Encounter: Payer: Self-pay | Admitting: Internal Medicine

## 2018-03-06 ENCOUNTER — Ambulatory Visit (INDEPENDENT_AMBULATORY_CARE_PROVIDER_SITE_OTHER): Payer: Medicare Other | Admitting: Internal Medicine

## 2018-03-06 VITALS — BP 150/84 | HR 60 | Ht 64.0 in | Wt 191.6 lb

## 2018-03-06 DIAGNOSIS — Z950 Presence of cardiac pacemaker: Secondary | ICD-10-CM

## 2018-03-06 DIAGNOSIS — I10 Essential (primary) hypertension: Secondary | ICD-10-CM

## 2018-03-06 DIAGNOSIS — I442 Atrioventricular block, complete: Secondary | ICD-10-CM | POA: Diagnosis not present

## 2018-03-06 LAB — CUP PACEART INCLINIC DEVICE CHECK
Date Time Interrogation Session: 20200303152746
Implantable Lead Implant Date: 20150202
Implantable Lead Location: 753860
MDC IDC LEAD IMPLANT DT: 20150202
MDC IDC LEAD LOCATION: 753859
MDC IDC PG IMPLANT DT: 20150202
MDC IDC PG SERIAL: 7588973

## 2018-03-06 NOTE — Patient Instructions (Signed)

## 2018-03-06 NOTE — Progress Notes (Signed)
HPI Mrs. Jessica Carpenter returns today for followup. She is a pleasant 68 yo woman with CHB, s/p DDD PM insertion and HTN. She has done well in the interim although she has been saddened in the past 2 weeks over the loss of her significant other. She has not had chest pain or sob. No edema. Her appetite is ok. No syncope. No edema. She is trying to lose weight. Allergies  Allergen Reactions  . Moxifloxacin Hives, Shortness Of Breath and Other (See Comments)    Caused syncope and throat tightness  . Penicillins Hives, Shortness Of Breath and Other (See Comments)    Caused syncope and throat tightness Has patient had a PCN reaction causing immediate rash, facial/tongue/throat swelling, SOB or lightheadedness with hypotension: Yes Has patient had a PCN reaction causing severe rash involving mucus membranes or skin necrosis: No Has patient had a PCN reaction that required hospitalization: hospital visit but not overnight Has patient had a PCN reaction occurring within the last 10 years: No If all of the above answers are "NO", then may proceed with Cephalosporin  . Arimidex [Anastrozole]      Current Outpatient Medications  Medication Sig Dispense Refill  . albuterol (PROVENTIL HFA;VENTOLIN HFA) 108 (90 Base) MCG/ACT inhaler Inhale 1-2 puffs into the lungs every 4 (four) hours as needed for wheezing or shortness of breath. 1 Inhaler 1  . amLODipine (NORVASC) 10 MG tablet Take 10 mg by mouth daily.    Marland Kitchen aspirin EC 81 MG tablet Take 81 mg by mouth daily.    . Budesonide (PULMICORT FLEXHALER) 90 MCG/ACT inhaler Inhale 2 puffs into the lungs 2 (two) times daily.    . celecoxib (CELEBREX) 100 MG capsule Take 100 mg by mouth 2 (two) times daily as needed (pain).     . clotrimazole-betamethasone (LOTRISONE) cream Apply to affected areas 2 times daily for two weeks (Patient taking differently: Apply 1 application topically 2 (two) times daily as needed (Fungal infection). Apply to affected areas 2 times  daily for two weeks if needed for fungal infection) 15 g 0  . doxycycline (VIBRAMYCIN) 100 MG capsule Take 1 capsule (100 mg total) by mouth 2 (two) times daily. 20 capsule 0  . fluticasone (FLONASE) 50 MCG/ACT nasal spray Place 2 sprays into both nostrils daily. 16 g 12  . glipiZIDE (GLUCOTROL XL) 10 MG 24 hr tablet Take 20 mg by mouth daily with breakfast.  3  . guaiFENesin (MUCINEX) 600 MG 12 hr tablet Take 1 tablet (600 mg total) by mouth 2 (two) times daily. 15 tablet 0  . hydrochlorothiazide (HYDRODIURIL) 25 MG tablet Take 25 mg by mouth daily.     . hydrOXYzine (ATARAX/VISTARIL) 25 MG tablet Take 1 tablet (25 mg total) by mouth every 6 (six) hours as needed for itching. 12 tablet 0  . hydrOXYzine (ATARAX/VISTARIL) 25 MG tablet Take 1 tablet (25 mg total) by mouth every 6 (six) hours as needed for itching. 30 tablet 0  . ipratropium (ATROVENT) 0.06 % nasal spray Place 2 sprays into both nostrils 4 (four) times daily. Prn nasal discharge 15 mL 12  . losartan (COZAAR) 100 MG tablet Take 100 mg by mouth daily.    . metformin (FORTAMET) 1000 MG (OSM) 24 hr tablet Take 1,000 mg by mouth 2 (two) times daily.    . methylPREDNISolone (MEDROL) 4 MG tablet Take 1 tablet (4 mg total) by mouth 2 (two) times daily. 10 tablet 1  . nystatin (MYCOSTATIN) 100000 UNIT/ML suspension  Take 5 mLs (500,000 Units total) by mouth 4 (four) times daily as needed (mouth sores). 180 mL 0  . omeprazole (PRILOSEC) 20 MG capsule TAKE 1 CAPSULE (20 MG TOTAL) BY MOUTH 2 (TWO) TIMES DAILY BEFORE A MEAL. 180 capsule 3  . Polyethyl Glycol-Propyl Glycol (SYSTANE OP) Place 1 drop into both eyes daily as needed (dry eyes).    . ranitidine (ZANTAC) 150 MG tablet Take 1 tablet (150 mg total) by mouth 2 (two) times daily. 60 tablet 0  . triamcinolone (KENALOG) 0.025 % cream Apply 1 application topically 2 (two) times daily. 15 g 0  . triamcinolone cream (KENALOG) 0.1 % Apply 1 application topically 2 (two) times daily as needed. For  itching 30 g 0   No current facility-administered medications for this visit.      Past Medical History:  Diagnosis Date  . Allergic rhinitis   . Arthritis    "left leg" (02/04/2013)  . Asthma   . Chronic bronchitis ([place])    "usually get it q yr" (02/04/2013)  . GERD (gastroesophageal reflux disease)   . Gout    "used to; haven't had it lately" (02/04/2013)  . Hypertension   . Pacemaker   . PONV (postoperative nausea and vomiting)   . Symptomatic bradycardia    s/p STJ dual chamber pacemaker by Dr Lovena Le 02/2013  . Type II diabetes mellitus ([facility])     ROS:   All systems reviewed and negative except as noted in the HPI.   Past Surgical History:  Procedure Laterality Date  . ABDOMINAL HYSTERECTOMY  ?1980's  . BREAST BIOPSY Right    "benign"  . BREAST LUMPECTOMY Right    "benign"  . DILATION AND CURETTAGE OF UTERUS    . GANGLION CYST EXCISION Right 2003   Ganglion of wrist, volar and distal radius  . PACEMAKER INSERTION  02/04/2013   STJ Assurity dual chamber pacemaker implanted by Dr Lovena Le for symptomatic bradycardia  . PERMANENT PACEMAKER INSERTION N/A 02/04/2013   Procedure: PERMANENT PACEMAKER INSERTION;  Surgeon: Evans Lance, MD;  Location[facility] CATH LAB;  Service: Cardiovascular;  Laterality: N/A;  . TUBAL LIGATION       Family History  Problem Relation Age of Onset  . Emphysema Father   . Breast cancer Mother      Social History   Socioeconomic History  . Marital status: Divorced    Spouse name: Not on file  . Number of children: Not on file  . Years of education: Not on file  . Highest education level: Not on file  Occupational History  . Occupation: disabled   Social Needs  . Financial resource strain: Not on file  . Food insecurity:    Worry: Not on file    Inability: Not on file  . Transportation needs:    Medical: Not on file    Non-medical: Not on file  Tobacco Use  . Smoking status: Never Smoker  . Smokeless tobacco: Never Used  Substance  and Sexual Activity  . Alcohol use: Yes    Comment: rarely  . Drug use: No  . Sexual activity: Not on file  Lifestyle  . Physical activity:    Days per week: Not on file    Minutes per session: Not on file  . Stress: Not on file  Relationships  . Social connections:    Talks on phone: Not on file    Gets together: Not on file    Attends religious service: Not on file  Active member of club or organization: Not on file    Attends meetings of clubs or organizations: Not on file    Relationship status: Not on file  . Intimate partner violence:    Fear of current or ex partner: Not on file    Emotionally abused: Not on file    Physically abused: Not on file    Forced sexual activity: Not on file  Other Topics Concern  . Not on file  Social History Narrative  . Not on file     BP (!) 150/84   Pulse 60   Ht 5\' 4"  (1.626 m)   Wt 191 lb 9.6 oz (86.9 kg)   SpO2 97%   BMI 32.89 kg/m   Physical Exam:  Well appearing NAD HEENT: Unremarkable Neck:  No JVD, no thyromegally Lymphatics:  No adenopathy Back:  No CVA tenderness Lungs:  Clear with no wheezes HEART:  Regular rate rhythm, no murmurs, no rubs, no clicks Abd:  soft, positive bowel sounds, no organomegally, no rebound, no guarding Ext:  2 plus pulses, no edema, no cyanosis, no clubbing Skin:  No rashes no nodules Neuro:  CN II through XII intact, motor grossly intact  EKG - nsr with AV pacing  DEVICE  Normal device function.  See PaceArt for details.   Assess/Plan: 1. CHB - she is asymptomatic, s/p PPM insertion. 2. PPM - her [employer] DDD PM is working normally. We will recheck in several months. 3. HTN - her blood pressure is up a bit but notes that it has been better. I have encouraged her to lose weight and reduce her salt intake.   Mikle [email or web address].D.

## 2018-03-07 NOTE — Addendum Note (Signed)
Addended by: Rose Phi on: 03/07/2018 04:45 PM   Modules accepted: Orders

## 2018-05-14 ENCOUNTER — Other Ambulatory Visit: Payer: Self-pay

## 2018-05-14 ENCOUNTER — Ambulatory Visit (INDEPENDENT_AMBULATORY_CARE_PROVIDER_SITE_OTHER): Payer: Medicare Other | Admitting: *Deleted

## 2018-05-14 DIAGNOSIS — I442 Atrioventricular block, complete: Secondary | ICD-10-CM | POA: Diagnosis not present

## 2018-05-15 LAB — CUP PACEART REMOTE DEVICE CHECK
Date Time Interrogation Session: 20200512100401
Implantable Lead Implant Date: 20150202
Implantable Lead Implant Date: 20150202
Implantable Lead Location: 753859
Implantable Lead Location: 753860
Implantable Pulse Generator Implant Date: 20150202
Pulse Gen Model: 2240
Pulse Gen Serial Number: 7588973

## 2018-05-29 NOTE — Progress Notes (Signed)
Remote pacemaker transmission.   

## 2018-06-29 DIAGNOSIS — E785 Hyperlipidemia, unspecified: Secondary | ICD-10-CM | POA: Insufficient documentation

## 2018-08-13 ENCOUNTER — Ambulatory Visit (INDEPENDENT_AMBULATORY_CARE_PROVIDER_SITE_OTHER): Payer: Medicare Other | Admitting: *Deleted

## 2018-08-13 DIAGNOSIS — I442 Atrioventricular block, complete: Secondary | ICD-10-CM | POA: Diagnosis not present

## 2018-08-14 LAB — CUP PACEART REMOTE DEVICE CHECK
Battery Remaining Longevity: 127 mo
Battery Remaining Percentage: 95.5 %
Battery Voltage: 2.98 V
Brady Statistic AP VP Percent: 38 %
Brady Statistic AP VS Percent: 1 %
Brady Statistic AS VP Percent: 62 %
Brady Statistic AS VS Percent: 1 %
Brady Statistic RA Percent Paced: 38 %
Brady Statistic RV Percent Paced: 99 %
Date Time Interrogation Session: 20200811014830
Implantable Lead Implant Date: 20150202
Implantable Lead Implant Date: 20150202
Implantable Lead Location: 753859
Implantable Lead Location: 753860
Implantable Pulse Generator Implant Date: 20150202
Lead Channel Impedance Value: 480 Ohm
Lead Channel Impedance Value: 480 Ohm
Lead Channel Pacing Threshold Amplitude: 0.75 V
Lead Channel Pacing Threshold Amplitude: 0.75 V
Lead Channel Pacing Threshold Pulse Width: 0.4 ms
Lead Channel Pacing Threshold Pulse Width: 0.4 ms
Lead Channel Sensing Intrinsic Amplitude: 3.9 mV
Lead Channel Sensing Intrinsic Amplitude: 4.8 mV
Lead Channel Setting Pacing Amplitude: 1 V
Lead Channel Setting Pacing Amplitude: 2 V
Lead Channel Setting Pacing Pulse Width: 0.4 ms
Lead Channel Setting Sensing Sensitivity: 2 mV
Pulse Gen Model: 2240
Pulse Gen Serial Number: 7588973

## 2018-08-21 ENCOUNTER — Encounter: Payer: Self-pay | Admitting: Cardiology

## 2018-08-21 NOTE — Progress Notes (Signed)
Remote pacemaker transmission.   

## 2018-10-28 ENCOUNTER — Encounter (HOSPITAL_COMMUNITY): Payer: Self-pay

## 2018-10-28 ENCOUNTER — Ambulatory Visit (HOSPITAL_COMMUNITY)
Admission: EM | Admit: 2018-10-28 | Discharge: 2018-10-28 | Disposition: A | Discharge status: Home / Self Care | Payer: Medicare Other | Attending: Emergency Medicine | Admitting: Emergency Medicine

## 2018-10-28 ENCOUNTER — Other Ambulatory Visit: Payer: Self-pay

## 2018-10-28 DIAGNOSIS — B349 Viral infection, unspecified: Secondary | ICD-10-CM | POA: Diagnosis not present

## 2018-10-28 MED ORDER — FLUTICASONE PROPIONATE 50 MCG/ACT NA SUSP: 1.0000 | Freq: Every day | NASAL | 0 refills | Status: DC

## 2018-10-28 MED ORDER — GUAIFENESIN ER 600 MG PO TB12: 1200.0000 mg | ORAL_TABLET | Freq: Two times a day (BID) | ORAL | 0 refills | Status: DC | PRN

## 2018-10-28 NOTE — Discharge Instructions (Signed)
Use the Flonase nasal spray and take the prescribed Mucinex as directed.    Follow-up with your primary care provider if your symptoms or not improving.

## 2018-10-28 NOTE — ED Provider Notes (Signed)
[place]    CSN: [id number] Arrival date & time: 10/28/18  1001      History   Chief Complaint Chief Complaint  Patient presents with  . Otalgia    HPI Jessica Carpenter is a 68 y.o. female.   Patient presents with 3-day history of left ear pain and sinus pressure.  Patient reports the ear pain is now radiating to her throat causing it to be mildly sore.  No treatments attempted at home.  Patient denies fever, chills, cough, shortness of breath, vomiting, diarrhea, or other symptoms.  The history is provided by the patient.    Past Medical History:  Diagnosis Date  . Allergic rhinitis   . Arthritis    "left leg" (02/04/2013)  . Asthma   . Chronic bronchitis ([place])    "usually get it q yr" (02/04/2013)  . GERD (gastroesophageal reflux disease)   . Gout    "used to; haven't had it lately" (02/04/2013)  . Hypertension   . Pacemaker   . PONV (postoperative nausea and vomiting)   . Symptomatic bradycardia    s/p STJ dual chamber pacemaker by Dr Lovena Le 02/2013  . Type II diabetes mellitus [facility])     Patient Active Problem List   Diagnosis Date Noted  . Pacemaker 05/07/2013  . 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 Complete heart block ([place]) 01/31/2013  . CHF (congestive heart failure) ([place]) 01/31/2013  . ACUTE BRONCHITIS 02/10/2009  . SINUSITIS, ACUTE 06/17/2008  . CANDIDIASIS, ORAL 03/14/2008  . 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 Essential hypertension 09/15/2006  . ALLERGIC RHINITIS 09/15/2006  . COCAINE ABUSE, HX OF 09/15/1998    Past Surgical History:  Procedure Laterality Date  . ABDOMINAL HYSTERECTOMY  ?1980's  . BREAST BIOPSY Right    "benign"  . BREAST LUMPECTOMY Right    "benign"  . DILATION AND CURETTAGE OF UTERUS    . GANGLION CYST EXCISION Right 2003   Ganglion of wrist, volar and distal radius  . PACEMAKER INSERTION  02/04/2013   STJ Assurity dual chamber pacemaker implanted by Dr Lovena Le for symptomatic bradycardia  . PERMANENT  PACEMAKER INSERTION N/A 02/04/2013   Procedure: PERMANENT PACEMAKER INSERTION;  Surgeon: Evans Lance, MD;  [facility] LAB;  Service: Cardiovascular;  Laterality: N/A;  . TUBAL LIGATION      OB History   No obstetric history on file.      Home Medications    Prior to Admission medications   Medication Sig Start Date End Date Taking? Authorizing Provider  albuterol (PROVENTIL HFA;VENTOLIN HFA) 108 (90 Base) MCG/ACT inhaler Inhale 1-2 puffs into the lungs every 4 (four) hours as needed for wheezing or shortness of breath. 11/23/17   Bast, Tressia Miners A, NP  amLODipine (NORVASC) 10 MG tablet Take 10 mg by mouth daily.    [provider]  aspirin EC 81 MG tablet Take 81 mg by mouth daily.    [provider]  Budesonide (PULMICORT FLEXHALER) 90 MCG/ACT inhaler Inhale 2 puffs into the lungs 2 (two) times daily.    [provider]  celecoxib (CELEBREX) 100 MG capsule Take 100 mg by mouth 2 (two) times daily as needed (pain).     [provider]  clotrimazole-betamethasone (LOTRISONE) cream Apply to affected areas 2 times daily for two weeks Patient taking differently: Apply 1 application topically 2 (two) times daily as needed (Fungal infection). Apply to affected areas 2 times daily for two weeks if needed for fungal infection 01/01/18  Enrique Sack, FNP  doxycycline (VIBRAMYCIN) 100 MG capsule Take 1 capsule (100 mg total) by mouth 2 (two) times daily. 01/28/18   Wardell Honour, MD  fluticasone (FLONASE) 50 MCG/ACT nasal spray Place 1 spray into both nostrils daily. 10/28/18   Sharion Balloon, NP  glipiZIDE (GLUCOTROL XL) 10 MG 24 hr tablet Take 20 mg by mouth daily with breakfast. 11/18/14   [provider]  guaiFENesin (MUCINEX) 600 MG 12 hr tablet Take 2 tablets (1,200 mg total) by mouth 2 (two) times daily as needed. 10/28/18   Sharion Balloon, NP  hydrochlorothiazide (HYDRODIURIL) 25 MG tablet Take 25 mg by mouth daily.     [provider]  hydrOXYzine (ATARAX/VISTARIL) 25 MG tablet Take 1 tablet (25 mg total) by mouth every 6 (six) hours as needed for itching. 01/01/18   Enrique Sack, FNP  hydrOXYzine (ATARAX/VISTARIL) 25 MG tablet Take 1 tablet (25 mg total) by mouth every 6 (six) hours as needed for itching. 01/27/18   Street, Mercedes, PA-C  ipratropium (ATROVENT) 0.06 % nasal spray Place 2 sprays into both nostrils 4 (four) times daily. Prn nasal discharge 12/20/14   Janne Napoleon, NP  losartan (COZAAR) 100 MG tablet Take 100 mg by mouth daily.    [provider]  metformin (FORTAMET) 1000 MG (OSM) 24 hr tablet Take 1,000 mg by mouth 2 (two) times daily. 07/30/15   [provider]  methylPREDNISolone (MEDROL) 4 MG tablet Take 1 tablet (4 mg total) by mouth 2 (two) times daily. 12/20/17   Robyn Haber, MD  nystatin (MYCOSTATIN) 100000 UNIT/ML suspension Take 5 mLs (500,000 Units total) by mouth 4 (four) times daily as needed (mouth sores). 02/22/14   Melony Overly, MD  omeprazole (PRILOSEC) 20 MG capsule TAKE 1 CAPSULE (20 MG TOTAL) BY MOUTH 2 (TWO) TIMES DAILY BEFORE A MEAL. 04/24/17   Evans Lance, MD  Polyethyl Glycol-Propyl Glycol (SYSTANE OP) Place 1 drop into both eyes daily as needed (dry eyes).    [provider]  ranitidine (ZANTAC) 150 MG tablet Take 1 tablet (150 mg total) by mouth 2 (two) times daily. 01/27/18   Street, Mercedes, PA-C  triamcinolone (KENALOG) 0.025 % cream Apply 1 application topically 2 (two) times daily. 10/14/16   Burnard Hawthorne, FNP  triamcinolone cream (KENALOG) 0.1 % Apply 1 application topically 2 (two) times daily as needed. For itching 01/27/18   Street, [place], PA-C    Family History Family History  Problem Relation Age of Onset  . Emphysema Father   . Breast cancer Mother     Social History Social History   Tobacco Use  . Smoking status: Never Smoker  . Smokeless tobacco: Never Used  Substance Use Topics  . Alcohol use: Yes     Comment: rarely  . Drug use: No     Allergies   Moxifloxacin, Penicillins, and Arimidex [anastrozole]   Review of Systems Review of Systems  Constitutional: Negative for chills and fever.  HENT: Positive for ear pain, sinus pressure and sore throat.   Eyes: Negative for pain and visual disturbance.  Respiratory: Negative for cough and shortness of breath.   Cardiovascular: Negative for chest pain and palpitations.  Gastrointestinal: Negative for abdominal pain and vomiting.  Genitourinary: Negative for dysuria and hematuria.  Musculoskeletal: Negative for arthralgias and back pain.  Skin: Negative for color change and rash.  Neurological: Negative for seizures and syncope.  All other systems reviewed and are negative.    Physical  Exam Triage Vital Signs ED Triage Vitals  Enc Vitals Group     BP      Pulse      Resp      Temp      Temp src      SpO2      Weight      Height      Head Circumference      Peak Flow      Pain Score      Pain Loc      Pain Edu?      Excl. in [place]?    No data found.  Updated Vital Signs BP (!) 150/86 (BP Location: Left Arm)   Pulse 68   Temp 97.9 F (36.6 C) (Oral)   Resp 16   SpO2 99%   Visual Acuity Right Eye Distance:   Left Eye Distance:   Bilateral Distance:    Right Eye Near:   Left Eye Near:    Bilateral Near:     Physical Exam Vitals signs and nursing note reviewed.  Constitutional:      General: She is not in acute distress.    Appearance: She is well-developed. She is not ill-appearing.  HENT:     Head: Normocephalic and atraumatic.     Right Ear: Tympanic membrane and ear canal normal.     Left Ear: Tympanic membrane and ear canal normal.     Nose: Nose normal.     Mouth/Throat:     Mouth: Mucous membranes are moist.     Pharynx: Oropharynx is clear.  Eyes:     Conjunctiva/sclera: Conjunctivae normal.  Neck:     Musculoskeletal: Neck supple.  Cardiovascular:     Rate and Rhythm: Normal rate and regular  rhythm.     Heart sounds: No murmur.  Pulmonary:     Effort: Pulmonary effort is normal. No respiratory distress.     Breath sounds: Normal breath sounds.  Abdominal:     General: Bowel sounds are normal.     Palpations: Abdomen is soft.     Tenderness: There is no abdominal tenderness. There is no guarding or rebound.  Skin:    General: Skin is warm and dry.     Findings: No rash.  Neurological:     Mental Status: She is alert.      UC Treatments / Results  Labs (all labs ordered are listed, but only abnormal results are displayed) Labs Reviewed - No data to display  EKG   Radiology No results found.  Procedures Procedures (including critical care time)  Medications Ordered in UC Medications - No data to display  Initial Impression / Assessment and Plan / UC Course  I have reviewed the triage vital signs and the nursing notes.  Pertinent labs & imaging results that were available during my care of the patient were reviewed by me and considered in my medical decision making (see chart for details).    Viral illness.  Treating with Flonase and Mucinex.  Instructed patient to take Tylenol or ibuprofen as needed for discomfort.  Instructed her to follow-up with her PCP if her symptoms are not improving.  Patient agrees to plan of care.     Final Clinical Impressions(s) / UC Diagnoses   Final diagnoses:  Viral illness     Discharge Instructions     Use the Flonase nasal spray and take the prescribed Mucinex as directed.    Follow-up with your primary care provider if your  symptoms or not improving.        ED Prescriptions    Medication Sig Dispense Auth. Provider   guaiFENesin (MUCINEX) 600 MG 12 hr tablet Take 2 tablets (1,200 mg total) by mouth 2 (two) times daily as needed. 12 tablet Sharion Balloon, NP   fluticasone (FLONASE) 50 MCG/ACT nasal spray Place 1 spray into both nostrils daily. 16 g Sharion Balloon, NP     PDMP not reviewed this encounter.    Sharion Balloon, NP 10/28/18 1032

## 2018-10-28 NOTE — ED Triage Notes (Signed)
Patient presents to Urgent Care with complaints of left ear pain and pressure since 3 days ago. Patient reports the pain is beginning to make her throat sore, has not taken anything for her pain.

## 2018-11-12 ENCOUNTER — Ambulatory Visit (INDEPENDENT_AMBULATORY_CARE_PROVIDER_SITE_OTHER): Payer: Medicare Other | Admitting: *Deleted

## 2018-11-12 DIAGNOSIS — I442 Atrioventricular block, complete: Secondary | ICD-10-CM

## 2018-11-12 DIAGNOSIS — I509 Heart failure, unspecified: Secondary | ICD-10-CM

## 2018-11-13 LAB — CUP PACEART REMOTE DEVICE CHECK
Battery Remaining Longevity: 126 mo
Battery Remaining Percentage: 95.5 %
Battery Voltage: 2.98 V
Brady Statistic AP VP Percent: 37 %
Brady Statistic AP VS Percent: 1 %
Brady Statistic AS VP Percent: 63 %
Brady Statistic AS VS Percent: 1 %
Brady Statistic RA Percent Paced: 37 %
Brady Statistic RV Percent Paced: 99 %
Date Time Interrogation Session: 20201109082827
Implantable Lead Implant Date: 20150202
Implantable Lead Implant Date: 20150202
Implantable Lead Location: 753859
Implantable Lead Location: 753860
Implantable Pulse Generator Implant Date: 20150202
Lead Channel Impedance Value: 450 Ohm
Lead Channel Impedance Value: 480 Ohm
Lead Channel Pacing Threshold Amplitude: 0.5 V
Lead Channel Pacing Threshold Amplitude: 0.75 V
Lead Channel Pacing Threshold Pulse Width: 0.4 ms
Lead Channel Pacing Threshold Pulse Width: 0.4 ms
Lead Channel Sensing Intrinsic Amplitude: 5 mV
Lead Channel Sensing Intrinsic Amplitude: 7 mV
Lead Channel Setting Pacing Amplitude: 0.75 V
Lead Channel Setting Pacing Amplitude: 2 V
Lead Channel Setting Pacing Pulse Width: 0.4 ms
Lead Channel Setting Sensing Sensitivity: 2 mV
Pulse Gen Model: 2240
Pulse Gen Serial Number: 7588973

## 2018-12-12 NOTE — Progress Notes (Signed)
Remote pacemaker transmission.   

## 2019-02-11 ENCOUNTER — Ambulatory Visit (INDEPENDENT_AMBULATORY_CARE_PROVIDER_SITE_OTHER): Payer: Medicare Other | Admitting: *Deleted

## 2019-02-11 DIAGNOSIS — I442 Atrioventricular block, complete: Secondary | ICD-10-CM

## 2019-02-11 LAB — CUP PACEART REMOTE DEVICE CHECK
Battery Remaining Longevity: 127 mo
Battery Remaining Percentage: 95.5 %
Battery Voltage: 2.98 V
Brady Statistic AP VP Percent: 36 %
Brady Statistic AP VS Percent: 1 %
Brady Statistic AS VP Percent: 64 %
Brady Statistic AS VS Percent: 1 %
Brady Statistic RA Percent Paced: 36 %
Brady Statistic RV Percent Paced: 99 %
Date Time Interrogation Session: 20210208020856
Implantable Lead Implant Date: 20150202
Implantable Lead Implant Date: 20150202
Implantable Lead Location: 753859
Implantable Lead Location: 753860
Implantable Pulse Generator Implant Date: 20150202
Lead Channel Impedance Value: 440 Ohm
Lead Channel Impedance Value: 460 Ohm
Lead Channel Pacing Threshold Amplitude: 0.5 V
Lead Channel Pacing Threshold Amplitude: 0.75 V
Lead Channel Pacing Threshold Pulse Width: 0.4 ms
Lead Channel Pacing Threshold Pulse Width: 0.4 ms
Lead Channel Sensing Intrinsic Amplitude: 2.2 mV
Lead Channel Sensing Intrinsic Amplitude: 4 mV
Lead Channel Setting Pacing Amplitude: 0.75 V
Lead Channel Setting Pacing Amplitude: 2 V
Lead Channel Setting Pacing Pulse Width: 0.4 ms
Lead Channel Setting Sensing Sensitivity: 2 mV
Pulse Gen Model: 2240
Pulse Gen Serial Number: 7588973

## 2019-02-12 NOTE — Progress Notes (Signed)
PPM Remote  

## 2019-02-21 ENCOUNTER — Ambulatory Visit: Payer: Medicare Other

## 2019-02-25 ENCOUNTER — Ambulatory Visit: Payer: Medicare Other | Attending: Internal Medicine

## 2019-02-25 DIAGNOSIS — Z23 Encounter for immunization: Secondary | ICD-10-CM

## 2019-02-25 NOTE — Progress Notes (Signed)
   Covid-19 Vaccination Clinic  Name:  Jessica Carpenter    MRN: [id number] DOB: 1950-03-21  02/25/2019  [name] was observed post Covid-19 immunization for 15 minutes without incidence. She was provided with Vaccine Information Sheet and instruction to access the V-Safe system.   [name] was instructed to call 911 with any severe reactions post vaccine: Marland Kitchen Difficulty breathing  . Swelling of your face and throat  . A fast heartbeat  . A bad rash all over your body  . Dizziness and weakness    Immunizations Administered    Name Date Dose VIS Date Route   Moderna COVID-19 Vaccine 02/25/2019 10:20 AM 0.5 mL 12/04/2018 Intramuscular   Manufacturer: Moderna   Lot: [id number]   [place]VO:[id number]

## 2019-03-19 ENCOUNTER — Ambulatory Visit (INDEPENDENT_AMBULATORY_CARE_PROVIDER_SITE_OTHER): Payer: Medicare Other | Admitting: Internal Medicine

## 2019-03-19 ENCOUNTER — Other Ambulatory Visit: Payer: Self-pay

## 2019-03-19 ENCOUNTER — Encounter: Payer: Self-pay | Admitting: Internal Medicine

## 2019-03-19 VITALS — BP 158/90 | HR 70 | Ht 64.5 in | Wt 204.6 lb

## 2019-03-19 DIAGNOSIS — I10 Essential (primary) hypertension: Secondary | ICD-10-CM

## 2019-03-19 DIAGNOSIS — Z950 Presence of cardiac pacemaker: Secondary | ICD-10-CM | POA: Diagnosis not present

## 2019-03-19 DIAGNOSIS — I442 Atrioventricular block, complete: Secondary | ICD-10-CM | POA: Diagnosis not present

## 2019-03-19 MED ORDER — OMEPRAZOLE 20 MG PO CPDR: 20.0000 mg | DELAYED_RELEASE_CAPSULE | Freq: Two times a day (BID) | ORAL | 3 refills | Status: DC

## 2019-03-19 NOTE — Patient Instructions (Addendum)
Medication Instructions:  Your physician recommends that you continue on your current medications as directed. Please refer to the Current Medication list given to you today.  1.  Prilosec 20 mg-  Take one tablet by mouth twice a day  Labwork: None ordered.  Testing/Procedures: None ordered.  Follow-Up: Your physician wants you to follow-up in: one year with Dr. Lovena Le.   You will receive a reminder letter in the mail two months in advance. If you don't receive a letter, please call our office to schedule the follow-up appointment.  Remote monitoring is used to monitor your Pacemaker from home. This monitoring reduces the number of office visits required to check your device to one time per year. It allows [place] to keep an eye on the functioning of your device to ensure it is working properly. You are scheduled for a device check from home on 05/13/2019. You may send your transmission at any time that day. If you have a wireless device, the transmission will be sent automatically. After your physician reviews your transmission, you will receive a postcard with your next transmission date.  Any Other Special Instructions Will Be Listed Below (If Applicable).  If you need a refill on your cardiac medications before your next appointment, please call your pharmacy.

## 2019-03-19 NOTE — Progress Notes (Signed)
HPI Jessica Carpenter returns today for followup of HTN and CHB, s/p PPM insertion. She has HTN. She has managed to avoid getting Covid and has been vaccinated. She has class 2 CHF symptoms. She has gained 12 lbs in the last year. She has not had syncope and has minimal palpitations.  Allergies  Allergen Reactions  . Moxifloxacin Hives, Shortness Of Breath and Other (See Comments)    Caused syncope and throat tightness  . Penicillins Hives, Shortness Of Breath and Other (See Comments)    Caused syncope and throat tightness Has patient had a PCN reaction causing immediate rash, facial/tongue/throat swelling, SOB or lightheadedness with hypotension: Yes Has patient had a PCN reaction causing severe rash involving mucus membranes or skin necrosis: No Has patient had a PCN reaction that required hospitalization: hospital visit but not overnight Has patient had a PCN reaction occurring within the last 10 years: No If all of the above answers are "NO", then may proceed with Cephalosporin  . Arimidex [Anastrozole]      Current Outpatient Medications  Medication Sig Dispense Refill  . albuterol (PROVENTIL HFA;VENTOLIN HFA) 108 (90 Base) MCG/ACT inhaler Inhale 1-2 puffs into the lungs every 4 (four) hours as needed for wheezing or shortness of breath. 1 Inhaler 1  . amLODipine (NORVASC) 10 MG tablet Take 10 mg by mouth daily.    Marland Kitchen aspirin EC 81 MG tablet Take 81 mg by mouth daily.    . Budesonide (PULMICORT FLEXHALER) 90 MCG/ACT inhaler Inhale 2 puffs into the lungs 2 (two) times daily.    . fluticasone (FLONASE) 50 MCG/ACT nasal spray Place 1 spray into both nostrils daily. 16 g 0  . hydrochlorothiazide (HYDRODIURIL) 25 MG tablet Take 25 mg by mouth daily.     Marland Kitchen ipratropium (ATROVENT) 0.06 % nasal spray Place 2 sprays into both nostrils 4 (four) times daily. Prn nasal discharge 15 mL 12  . losartan (COZAAR) 100 MG tablet Take 100 mg by mouth daily.    . metformin (FORTAMET) 1000 MG (OSM) 24 hr  tablet Take 1,000 mg by mouth 2 (two) times daily.    Marland Kitchen nystatin (MYCOSTATIN) 100000 UNIT/ML suspension Take 5 mLs (500,000 Units total) by mouth 4 (four) times daily as needed (mouth sores). 180 mL 0  . omeprazole (PRILOSEC) 20 MG capsule Take 1 capsule (20 mg total) by mouth 2 (two) times daily before a meal. 180 capsule 3  . Polyethyl Glycol-Propyl Glycol (SYSTANE OP) Place 1 drop into both eyes daily as needed (dry eyes).     No current facility-administered medications for this visit.     Past Medical History:  Diagnosis Date  . Allergic rhinitis   . Arthritis    "left leg" (02/04/2013)  . Asthma   . Chronic bronchitis ([place])    "usually get it q yr" (02/04/2013)  . GERD (gastroesophageal reflux disease)   . Gout    "used to; haven't had it lately" (02/04/2013)  . Hypertension   . Pacemaker   . PONV (postoperative nausea and vomiting)   . Symptomatic bradycardia    s/p STJ dual chamber pacemaker by Dr Lovena Le 02/2013  . Type II diabetes mellitus ([facility])     ROS:   All systems reviewed and negative except as noted in the HPI.   Past Surgical History:  Procedure Laterality Date  . ABDOMINAL HYSTERECTOMY  ?1980's  . BREAST BIOPSY Right    "benign"  . BREAST LUMPECTOMY Right    "benign"  .  DILATION AND CURETTAGE OF UTERUS    . GANGLION CYST EXCISION Right 2003   Ganglion of wrist, volar and distal radius  . PACEMAKER INSERTION  02/04/2013   STJ Assurity dual chamber pacemaker implanted by Dr Lovena Le for symptomatic bradycardia  . PERMANENT PACEMAKER INSERTION N/A 02/04/2013   Procedure: PERMANENT PACEMAKER INSERTION;  Surgeon: Evans Lance, MD;  Location: [facility] CATH LAB;  Service: Cardiovascular;  Laterality: N/A;  . TUBAL LIGATION       Family History  Problem Relation Age of Onset  . Emphysema Father   . Breast cancer Mother      Social History   Socioeconomic History  . Marital status: Divorced    Spouse name: Not on file  . Number of children: Not on file  . Years  of education: Not on file  . Highest education level: Not on file  Occupational History  . Occupation: disabled   Tobacco Use  . Smoking status: Never Smoker  . Smokeless tobacco: Never Used  Substance and Sexual Activity  . Alcohol use: Yes    Comment: rarely  . Drug use: No  . Sexual activity: Not on file  Other Topics Concern  . Not on file  Social History Narrative  . Not on file   Social Determinants of Health   Financial Resource Strain:   . Difficulty of Paying Living Expenses:   Food Insecurity:   . Worried About Charity fundraiser in the Last Year:   . Arboriculturist in the Last Year:   Transportation Needs:   . Film/video editor (Medical):   Marland Kitchen Lack of Transportation (Non-Medical):   Physical Activity:   . Days of Exercise per Week:   . Minutes of Exercise per Session:   Stress:   . Feeling of Stress :   Social Connections:   . Frequency of Communication with Friends and Family:   . Frequency of Social Gatherings with Friends and Family:   . Attends Religious Services:   . Active Member of Clubs or Organizations:   . Attends Archivist Meetings:   Marland Kitchen Marital Status:   Intimate Partner Violence:   . Fear of Current or Ex-Partner:   . Emotionally Abused:   Marland Kitchen Physically Abused:   . Sexually Abused:      BP (!) 158/90   Pulse 70   Ht 5' 4.5" (1.638 m)   Wt 204 lb 9.6 oz (92.8 kg)   SpO2 92%   BMI 34.58 kg/m   Physical Exam:  Well appearing NAD HEENT: Unremarkable Neck:  No JVD, no thyromegally Lymphatics:  No adenopathy Back:  No CVA tenderness Lungs:  Clear with no wheezes HEART:  Regular rate rhythm, no murmurs, no rubs, no clicks Abd:  Obese, soft, positive bowel sounds, no organomegally, no rebound, no guarding Ext:  2 plus pulses, no edema, no cyanosis, no clubbing Skin:  No rashes no nodules Neuro:  CN II through XII intact, motor grossly intact  EKG - NSR with ventricular pacing  DEVICE  Normal device function.  See  PaceArt for details.   Assess/Plan: 1. CHB - she is asymptomatic, s/p PPM insertion.  2. HTN - her SBP is elevated though it is usually better at home. I encouraged her to try and lose weight and to increase her activity.  3. PPM - Her [employer] Jude DDD PM is working normally. We will recheck in several months.   Mikle Bosworth.D

## 2019-03-26 ENCOUNTER — Ambulatory Visit: Payer: Medicare Other | Attending: Family

## 2019-03-26 DIAGNOSIS — Z23 Encounter for immunization: Secondary | ICD-10-CM

## 2019-03-26 NOTE — Progress Notes (Signed)
   Covid-19 Vaccination Clinic  Name:  Jessica Carpenter    MRN: [id number] DOB: 09/13/1950  03/26/2019  [name] was observed post Covid-19 immunization for 15 minutes without incident. She was provided with Vaccine Information Sheet and instruction to access the V-Safe system.   [name] was instructed to call 911 with any severe reactions post vaccine: Marland Kitchen Difficulty breathing  . Swelling of face and throat  . A fast heartbeat  . A bad rash all over body  . Dizziness and weakness   Immunizations Administered    Name Date Dose VIS Date Route   Moderna COVID-19 Vaccine 03/26/2019 11:13 AM 0.5 mL 12/04/2018 Intramuscular   Manufacturer: Moderna   Lot: [id number]   [place]VO:[id number]

## 2019-05-13 ENCOUNTER — Ambulatory Visit (INDEPENDENT_AMBULATORY_CARE_PROVIDER_SITE_OTHER): Payer: Medicare Other | Admitting: *Deleted

## 2019-05-13 DIAGNOSIS — I442 Atrioventricular block, complete: Secondary | ICD-10-CM

## 2019-05-14 LAB — CUP PACEART REMOTE DEVICE CHECK
Battery Remaining Longevity: 125 mo
Battery Remaining Percentage: 95.5 %
Battery Voltage: 2.98 V
Brady Statistic AP VP Percent: 25 %
Brady Statistic AP VS Percent: 1 %
Brady Statistic AS VP Percent: 75 %
Brady Statistic AS VS Percent: 1 %
Brady Statistic RA Percent Paced: 25 %
Brady Statistic RV Percent Paced: 99 %
Date Time Interrogation Session: 20210510154953
Implantable Lead Implant Date: 20150202
Implantable Lead Implant Date: 20150202
Implantable Lead Location: 753859
Implantable Lead Location: 753860
Implantable Pulse Generator Implant Date: 20150202
Lead Channel Impedance Value: 450 Ohm
Lead Channel Impedance Value: 450 Ohm
Lead Channel Pacing Threshold Amplitude: 0.625 V
Lead Channel Pacing Threshold Amplitude: 0.75 V
Lead Channel Pacing Threshold Pulse Width: 0.4 ms
Lead Channel Pacing Threshold Pulse Width: 0.4 ms
Lead Channel Sensing Intrinsic Amplitude: 4.1 mV
Lead Channel Sensing Intrinsic Amplitude: 6.3 mV
Lead Channel Setting Pacing Amplitude: 0.875
Lead Channel Setting Pacing Amplitude: 2 V
Lead Channel Setting Pacing Pulse Width: 0.4 ms
Lead Channel Setting Sensing Sensitivity: 2 mV
Pulse Gen Model: 2240
Pulse Gen Serial Number: 7588973

## 2019-05-15 NOTE — Progress Notes (Signed)
Remote pacemaker transmission.   

## 2019-08-12 ENCOUNTER — Ambulatory Visit (INDEPENDENT_AMBULATORY_CARE_PROVIDER_SITE_OTHER): Payer: Medicare Other | Admitting: *Deleted

## 2019-08-12 DIAGNOSIS — I442 Atrioventricular block, complete: Secondary | ICD-10-CM | POA: Diagnosis not present

## 2019-08-14 LAB — CUP PACEART REMOTE DEVICE CHECK
Battery Remaining Longevity: 119 mo
Battery Remaining Percentage: 95.5 %
Battery Voltage: 2.96 V
Brady Statistic AP VP Percent: 27 %
Brady Statistic AP VS Percent: 1 %
Brady Statistic AS VP Percent: 73 %
Brady Statistic AS VS Percent: 1 %
Brady Statistic RA Percent Paced: 27 %
Brady Statistic RV Percent Paced: 99 %
Date Time Interrogation Session: 20210809083033
Implantable Lead Implant Date: 20150202
Implantable Lead Implant Date: 20150202
Implantable Lead Location: 753859
Implantable Lead Location: 753860
Implantable Pulse Generator Implant Date: 20150202
Lead Channel Impedance Value: 450 Ohm
Lead Channel Impedance Value: 490 Ohm
Lead Channel Pacing Threshold Amplitude: 0.75 V
Lead Channel Pacing Threshold Amplitude: 0.75 V
Lead Channel Pacing Threshold Pulse Width: 0.4 ms
Lead Channel Pacing Threshold Pulse Width: 0.4 ms
Lead Channel Sensing Intrinsic Amplitude: 1.4 mV
Lead Channel Sensing Intrinsic Amplitude: 6 mV
Lead Channel Setting Pacing Amplitude: 1 V
Lead Channel Setting Pacing Amplitude: 2 V
Lead Channel Setting Pacing Pulse Width: 0.4 ms
Lead Channel Setting Sensing Sensitivity: 2 mV
Pulse Gen Model: 2240
Pulse Gen Serial Number: 7588973

## 2019-08-15 NOTE — Progress Notes (Signed)
Remote pacemaker transmission.   

## 2019-10-22 ENCOUNTER — Other Ambulatory Visit: Payer: Self-pay

## 2019-10-22 ENCOUNTER — Encounter (HOSPITAL_COMMUNITY): Payer: Self-pay | Admitting: *Deleted

## 2019-10-22 ENCOUNTER — Ambulatory Visit (HOSPITAL_COMMUNITY)
Admission: EM | Admit: 2019-10-22 | Discharge: 2019-10-22 | Disposition: A | Discharge status: Home / Self Care | Payer: Medicare Other | Attending: Urgent Care | Admitting: Urgent Care

## 2019-10-22 DIAGNOSIS — N3001 Acute cystitis with hematuria: Secondary | ICD-10-CM

## 2019-10-22 DIAGNOSIS — R300 Dysuria: Secondary | ICD-10-CM

## 2019-10-22 DIAGNOSIS — E1165 Type 2 diabetes mellitus with hyperglycemia: Secondary | ICD-10-CM | POA: Diagnosis present

## 2019-10-22 DIAGNOSIS — R1030 Lower abdominal pain, unspecified: Secondary | ICD-10-CM | POA: Diagnosis not present

## 2019-10-22 DIAGNOSIS — R350 Frequency of micturition: Secondary | ICD-10-CM

## 2019-10-22 LAB — POCT URINALYSIS DIPSTICK, ED / UC
Bilirubin Urine: NEGATIVE
Glucose, UA: >=1000 mg/dL — AB
Ketones, ur: 15 mg/dL — AB
Nitrite: NEGATIVE
Protein, ur: NEGATIVE mg/dL
Specific Gravity, Urine: 1.015 (ref 1.005–1.030)
Urobilinogen, UA: 0.2 mg/dL (ref 0.0–1.0)
pH: 5 (ref 5.0–8.0)

## 2019-10-22 LAB — CBG MONITORING, ED: Glucose-Capillary: 371 mg/dL — ABNORMAL HIGH (ref 70–99)

## 2019-10-22 MED ORDER — SULFAMETHOXAZOLE-TRIMETHOPRIM 800-160 MG PO TABS: 1.0000 | ORAL_TABLET | Freq: Two times a day (BID) | ORAL | 0 refills | Status: DC

## 2019-10-22 NOTE — Discharge Instructions (Signed)
Make sure you hydrate very well with plain water and a quantity of 64 ounces of water a day.  Please limit drinks that are considered urinary irritants such as soda, sweet tea, coffee, energy drinks, alcohol.  These can worsen your UTI symptoms and also be the source of them.  I will let you know about your urine culture results through MyChart to see if we need to change your antibiotics based off of those results.

## 2019-10-22 NOTE — ED Provider Notes (Signed)
[place]   MRN: [id number] DOB: 22-Jun-1950  Subjective:   Jessica Carpenter is a 69 y.o. female presenting for 1 week hx of acute onset urinary frequency, dysuria, lower abdominal pain, L>R. Has a hx of uncontrolled diabetes.  Admits that she drinks plenty of sweet tea.  Also has a history of CHF, hypertension, has a pacemaker in place.  Denies chest pain.  Regarding her diabetes she is not on insulin, takes Metformin.  No current facility-administered medications for this encounter.  Current Outpatient Medications:    albuterol (PROVENTIL HFA;VENTOLIN HFA) 108 (90 Base) MCG/ACT inhaler, Inhale 1-2 puffs into the lungs every 4 (four) hours as needed for wheezing or shortness of breath., Disp: 1 Inhaler, Rfl: 1   amLODipine (NORVASC) 10 MG tablet, Take 10 mg by mouth daily., Disp: , Rfl:    aspirin EC 81 MG tablet, Take 81 mg by mouth daily., Disp: , Rfl:    Budesonide (PULMICORT FLEXHALER) 90 MCG/ACT inhaler, Inhale 2 puffs into the lungs 2 (two) times daily., Disp: , Rfl:    fluticasone (FLONASE) 50 MCG/ACT nasal spray, Place 1 spray into both nostrils daily., Disp: 16 g, Rfl: 0   hydrochlorothiazide (HYDRODIURIL) 25 MG tablet, Take 25 mg by mouth daily. , Disp: , Rfl:    ipratropium (ATROVENT) 0.06 % nasal spray, Place 2 sprays into both nostrils 4 (four) times daily. Prn nasal discharge, Disp: 15 mL, Rfl: 12   losartan (COZAAR) 100 MG tablet, Take 100 mg by mouth daily., Disp: , Rfl:    metformin (FORTAMET) 1000 MG (OSM) 24 hr tablet, Take 1,000 mg by mouth 2 (two) times daily., Disp: , Rfl:    omeprazole (PRILOSEC) 20 MG capsule, Take 1 capsule (20 mg total) by mouth 2 (two) times daily before a meal., Disp: 180 capsule, Rfl: 3   Polyethyl Glycol-Propyl Glycol (SYSTANE OP), Place 1 drop into both eyes daily as needed (dry eyes)., Disp: , Rfl:    nystatin (MYCOSTATIN) 100000 UNIT/ML suspension, Take 5 mLs (500,000 Units total) by mouth 4 (four) times daily  as needed (mouth sores)., Disp: 180 mL, Rfl: 0   Allergies  Allergen Reactions   Moxifloxacin Hives, Shortness Of Breath and Other (See Comments)    Caused syncope and throat tightness   Penicillins Hives, Shortness Of Breath and Other (See Comments)    Caused syncope and throat tightness Has patient had a PCN reaction causing immediate rash, facial/tongue/throat swelling, SOB or lightheadedness with hypotension: Yes Has patient had a PCN reaction causing severe rash involving mucus membranes or skin necrosis: No Has patient had a PCN reaction that required hospitalization: hospital visit but not overnight Has patient had a PCN reaction occurring within the last 10 years: No If all of the above answers are "NO", then may proceed with Cephalosporin   Arimidex [Anastrozole]     Past Medical History:  Diagnosis Date   Allergic rhinitis    Arthritis    "left leg" (02/04/2013)   Asthma    Chronic bronchitis ([place])    "usually get it q yr" (02/04/2013)   GERD (gastroesophageal reflux disease)    Gout    "used to; haven't had it lately" (02/04/2013)   Hypertension    Pacemaker    PONV (postoperative nausea and vomiting)    Symptomatic bradycardia    s/p STJ dual chamber pacemaker by Dr Lovena Le 02/2013   Type II diabetes mellitus [facility])      Past Surgical History:  Procedure Laterality  Date   ABDOMINAL HYSTERECTOMY  ?1980's   BREAST BIOPSY Right    "benign"   BREAST LUMPECTOMY Right    "benign"   DILATION AND CURETTAGE OF UTERUS     GANGLION CYST EXCISION Right 2003   Ganglion of wrist, volar and distal radius   PACEMAKER INSERTION  02/04/2013   STJ Assurity dual chamber pacemaker implanted by Dr Lovena Le for symptomatic bradycardia   PERMANENT PACEMAKER INSERTION N/A 02/04/2013   Procedure: PERMANENT PACEMAKER INSERTION;  Surgeon: Evans Lance, MD;  Location: [facility] CATH LAB;  Service: Cardiovascular;  Laterality: N/A;   TUBAL LIGATION      Family History  Problem  Relation Age of Onset   Emphysema Father    Breast cancer Mother     Social History   Tobacco Use   Smoking status: Never Smoker   Smokeless tobacco: Never Used  Vaping Use   Vaping Use: Never used  Substance Use Topics   Alcohol use: Not Currently    Comment: rarely   Drug use: No    ROS   Objective:   Vitals: BP 136/86 (BP Location: Left Arm)    Pulse 79    Temp 97.9 F (36.6 C) (Oral)    Resp 20    SpO2 100%   Physical Exam Constitutional:      General: She is not in acute distress.    Appearance: Normal appearance. She is well-developed and normal weight. She is not ill-appearing, toxic-appearing or diaphoretic.  HENT:     Head: Normocephalic and atraumatic.     Right Ear: External ear normal.     Left Ear: External ear normal.     Nose: Nose normal.     Mouth/Throat:     Mouth: Mucous membranes are moist.     Pharynx: Oropharynx is clear.  Eyes:     General: No scleral icterus.    Extraocular Movements: Extraocular movements intact.     Pupils: Pupils are equal, round, and reactive to light.  Cardiovascular:     Rate and Rhythm: Normal rate and regular rhythm.     Heart sounds: Normal heart sounds. No murmur heard.  No friction rub. No gallop.   Pulmonary:     Effort: Pulmonary effort is normal. No respiratory distress.     Breath sounds: Normal breath sounds. No stridor. No wheezing, rhonchi or rales.  Abdominal:     General: Bowel sounds are normal. There is no distension.     Palpations: Abdomen is soft. There is no mass.     Tenderness: There is abdominal tenderness in the right lower quadrant, periumbilical area, suprapubic area and left lower quadrant. There is no right CVA tenderness, left CVA tenderness, guarding or rebound.  Skin:    General: Skin is warm and dry.     Coloration: Skin is not pale.     Findings: No rash.  Neurological:     General: No focal deficit present.     Mental Status: She is alert and oriented to person, place,  and time.  Psychiatric:        Mood and Affect: Mood normal.        Behavior: Behavior normal.        Thought Content: Thought content normal.        Judgment: Judgment normal.     Results for orders placed or performed during the hospital encounter of 10/22/19 (from the past 24 hour(s))  POC Urinalysis dipstick     Status: Abnormal  Collection Time: 10/22/19 11:48 AM  Result Value Ref Range   Glucose, UA >=1000 (A) NEGATIVE mg/dL   Bilirubin Urine NEGATIVE NEGATIVE   Ketones, ur 15 (A) NEGATIVE mg/dL   Specific Gravity, Urine 1.015 1.005 - 1.030   Hgb urine dipstick TRACE (A) NEGATIVE   pH 5.0 5.0 - 8.0   Protein, ur NEGATIVE NEGATIVE mg/dL   Urobilinogen, UA 0.2 0.0 - 1.0 mg/dL   Nitrite NEGATIVE NEGATIVE   Leukocytes,Ua TRACE (A) NEGATIVE    Assessment and Plan :   PDMP not reviewed this encounter.  1. Acute cystitis with hematuria   2. Urinary frequency   3. Dysuria   4. Lower abdominal pain   5. Uncontrolled type 2 diabetes mellitus with hyperglycemia (HCC)     Start Bactrim to cover for acute cystitis, urine culture pending.  Recommended good hydration, limiting urinary irritants, sweet tea.  Emphasized need for better diabetic control.  Patient has an upcoming appointment with her PCP.  Counseled patient on potential for adverse effects with medications prescribed/recommended today, ER and return-to-clinic precautions discussed, patient verbalized understanding.    Jaynee Eagles, PA-C 10/22/19 1209

## 2019-10-22 NOTE — ED Triage Notes (Signed)
Patient in with complaints of left lower abdominal pain and burning with urination x 1 week. Patient states she has experienced urinary frequency.

## 2019-10-22 NOTE — ED Notes (Signed)
Patient states she urinated in [name] before coming to Urgent Care. Water provided to help patient obtain urine sample.

## 2019-10-23 LAB — URINE CULTURE

## 2019-11-06 ENCOUNTER — Ambulatory Visit (HOSPITAL_COMMUNITY)
Admission: EM | Admit: 2019-11-06 | Discharge: 2019-11-06 | Disposition: A | Discharge status: Home / Self Care | Payer: Medicare Other | Attending: Family Medicine | Admitting: Family Medicine

## 2019-11-06 ENCOUNTER — Telehealth (HOSPITAL_COMMUNITY): Payer: Self-pay

## 2019-11-06 ENCOUNTER — Other Ambulatory Visit: Payer: Self-pay

## 2019-11-06 ENCOUNTER — Encounter (HOSPITAL_COMMUNITY): Payer: Self-pay

## 2019-11-06 DIAGNOSIS — Z87440 Personal history of urinary (tract) infections: Secondary | ICD-10-CM | POA: Insufficient documentation

## 2019-11-06 DIAGNOSIS — R739 Hyperglycemia, unspecified: Secondary | ICD-10-CM | POA: Diagnosis present

## 2019-11-06 DIAGNOSIS — E119 Type 2 diabetes mellitus without complications: Secondary | ICD-10-CM | POA: Insufficient documentation

## 2019-11-06 DIAGNOSIS — Z880 Allergy status to penicillin: Secondary | ICD-10-CM | POA: Insufficient documentation

## 2019-11-06 DIAGNOSIS — N898 Other specified noninflammatory disorders of vagina: Secondary | ICD-10-CM | POA: Diagnosis not present

## 2019-11-06 DIAGNOSIS — R300 Dysuria: Secondary | ICD-10-CM

## 2019-11-06 LAB — POCT URINALYSIS DIPSTICK, ED / UC
Bilirubin Urine: NEGATIVE
Glucose, UA: >=1000 mg/dL — AB
Hgb urine dipstick: NEGATIVE
Ketones, ur: NEGATIVE mg/dL
Nitrite: NEGATIVE
Protein, ur: NEGATIVE mg/dL
Specific Gravity, Urine: 1.015 (ref 1.005–1.030)
Urobilinogen, UA: 0.2 mg/dL (ref 0.0–1.0)
pH: 5.5 (ref 5.0–8.0)

## 2019-11-06 LAB — CBG MONITORING, ED: Glucose-Capillary: 426 mg/dL — ABNORMAL HIGH (ref 70–99)

## 2019-11-06 MED ORDER — FLUCONAZOLE 150 MG PO TABS: 150.0000 mg | ORAL_TABLET | Freq: Every day | ORAL | 0 refills | Status: DC

## 2019-11-06 MED ORDER — NYSTATIN-TRIAMCINOLONE 100000-0.1 UNIT/GM-% EX CREA: TOPICAL_CREAM | CUTANEOUS | 0 refills | Status: DC

## 2019-11-06 NOTE — ED Triage Notes (Signed)
Pt presents with white vaginal discharge and discomfort when urinating for x 1 week. Denies fever, back pain, nausea.

## 2019-11-06 NOTE — Discharge Instructions (Addendum)
Treating you for a yeast infection Medicines as prescribed  Follow up as needed for continued or worsening symptoms

## 2019-11-06 NOTE — ED Provider Notes (Signed)
[place]    CSN: [id number] Arrival date & time: 11/06/19  3532      History   Chief Complaint Chief Complaint  Patient presents with  . Dysuria  . Vaginal Discharge    HPI Jessica Carpenter is a 69 y.o. female.   Patient is a 69 year old female past medical history of type 2 diabetes.  She presents today with vaginal discharge, itching and irritation.  This is been present for the past week.  Was recently treated for urinary tract infection.  Denies any fever, flank pain, back pain or lower abdominal pain.  Denies any concern for STDs.  Patient blood sugar very elevated today.  Reporting she ate a lot of fruit before coming.  Has not taken her Metformin.  Was unaware that fruit would raise her blood sugar.      Past Medical History:  Diagnosis Date  . Allergic rhinitis   . Arthritis    "left leg" (02/04/2013)  . Asthma   . Chronic bronchitis ([place])    "usually get it q yr" (02/04/2013)  . GERD (gastroesophageal reflux disease)   . Gout    "used to; haven't had it lately" (02/04/2013)  . Hypertension   . Pacemaker   . PONV (postoperative nausea and vomiting)   . Symptomatic bradycardia    s/p STJ dual chamber pacemaker by Dr Lovena Le 02/2013  . Type II diabetes mellitus [facility])     Patient Active Problem List   Diagnosis Date Noted  . Pacemaker 05/07/2013  . 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 CHF (congestive heart failure) ([place]) 01/31/2013  . ACUTE BRONCHITIS 02/10/2009  . SINUSITIS, ACUTE 06/17/2008  . CANDIDIASIS, ORAL 03/14/2008  . 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 Essential hypertension 09/15/2006  . ALLERGIC RHINITIS 09/15/2006  . COCAINE ABUSE, HX OF 09/15/1998    Past Surgical History:  Procedure Laterality Date  . ABDOMINAL HYSTERECTOMY  ?1980's  . BREAST BIOPSY Right    "benign"  . BREAST LUMPECTOMY Right    "benign"  . DILATION AND CURETTAGE OF UTERUS    . GANGLION CYST EXCISION  Right 2003   Ganglion of wrist, volar and distal radius  . PACEMAKER INSERTION  02/04/2013   STJ Assurity dual chamber pacemaker implanted by Dr Lovena Le for symptomatic bradycardia  . PERMANENT PACEMAKER INSERTION N/A 02/04/2013   Procedure: PERMANENT PACEMAKER INSERTION;  Surgeon: Evans Lance, MD;  Location: [facility] CATH LAB;  Service: Cardiovascular;  Laterality: N/A;  . TUBAL LIGATION      OB History   No obstetric history on file.      Home Medications    Prior to Admission medications   Medication Sig Start Date End Date Taking? Authorizing Provider  albuterol (PROVENTIL HFA;VENTOLIN HFA) 108 (90 Base) MCG/ACT inhaler Inhale 1-2 puffs into the lungs every 4 (four) hours as needed for wheezing or shortness of breath. 11/23/17   Hudsyn Champine, Tressia Miners A, NP  amLODipine (NORVASC) 10 MG tablet Take 10 mg by mouth daily.    [provider]  aspirin EC 81 MG tablet Take 81 mg by mouth daily.    [provider]  Budesonide (PULMICORT FLEXHALER) 90 MCG/ACT inhaler Inhale 2 puffs into the lungs 2 (two) times daily.    [provider]  fluconazole (DIFLUCAN) 150 MG tablet Take 1 tablet (150 mg total) by mouth daily. 11/06/19   Livio Ledwith, Tressia Miners A, NP  fluticasone (FLONASE) 50 MCG/ACT nasal spray Place 1 spray into  both nostrils daily. 10/28/18   Sharion Balloon, NP  hydrochlorothiazide (HYDRODIURIL) 25 MG tablet Take 25 mg by mouth daily.     [provider]  ipratropium (ATROVENT) 0.06 % nasal spray Place 2 sprays into both nostrils 4 (four) times daily. Prn nasal discharge 12/20/14   Janne Napoleon, NP  losartan (COZAAR) 100 MG tablet Take 100 mg by mouth daily.    [provider]  metformin (FORTAMET) 1000 MG (OSM) 24 hr tablet Take 1,000 mg by mouth 2 (two) times daily. 07/30/15   [provider]  nystatin-triamcinolone (MYCOLOG II) cream Apply to affected area daily 11/06/19   Loura Halt A, NP  omeprazole (PRILOSEC) 20 MG capsule Take 1 capsule (20 mg total) by  mouth 2 (two) times daily before a meal. 03/19/19   Evans Lance, MD  Polyethyl Glycol-Propyl Glycol (SYSTANE OP) Place 1 drop into both eyes daily as needed (dry eyes).    [provider]    Family History Family History  Problem Relation Age of Onset  . Emphysema Father   . Breast cancer Mother     Social History Social History   Tobacco Use  . Smoking status: Never Smoker  . Smokeless tobacco: Never Used  Vaping Use  . Vaping Use: Never used  Substance Use Topics  . Alcohol use: Not Currently    Comment: rarely  . Drug use: No     Allergies   Moxifloxacin, Penicillins, and Arimidex [anastrozole]   Review of Systems Review of Systems   Physical Exam Triage Vital Signs ED Triage Vitals  Enc Vitals Group     BP 11/06/19 0939 114/68     Pulse Rate 11/06/19 0939 74     Resp 11/06/19 0939 20     Temp 11/06/19 0939 98.1 F (36.7 C)     Temp Source 11/06/19 0939 Oral     SpO2 11/06/19 0939 97 %     Weight --      Height --      Head Circumference --      Peak Flow --      Pain Score 11/06/19 0938 0     Pain Loc --      Pain Edu? --      Excl. in [place]? --    No data found.  Updated Vital Signs BP 114/68 (BP Location: Right Arm)   Pulse 74   Temp 98.1 F (36.7 C) (Oral)   Resp 20   SpO2 97%   Visual Acuity Right Eye Distance:   Left Eye Distance:   Bilateral Distance:    Right Eye Near:   Left Eye Near:    Bilateral Near:     Physical Exam Vitals and nursing note reviewed.  Constitutional:      General: She is not in acute distress.    Appearance: Normal appearance. She is not ill-appearing, toxic-appearing or diaphoretic.  HENT:     Head: Normocephalic.     Nose: Nose normal.  Eyes:     Conjunctiva/sclera: Conjunctivae normal.  Pulmonary:     Effort: Pulmonary effort is normal.  Musculoskeletal:        General: Normal range of motion.     Cervical back: Normal range of motion.  Skin:    General: Skin is warm and dry.      Findings: No rash.  Neurological:     Mental Status: She is alert.  Psychiatric:        Mood and Affect:  Mood normal.      UC Treatments / Results  Labs (all labs ordered are listed, but only abnormal results are displayed) Labs Reviewed  POCT URINALYSIS DIPSTICK, ED / UC - Abnormal; Notable for the following components:      Result Value   Glucose, UA >=1000 (*)    Leukocytes,Ua TRACE (*)    All other components within normal limits  CBG MONITORING, ED - Abnormal; Notable for the following components:   Glucose-Capillary 426 (*)    All other components within normal limits  URINE CULTURE    EKG   Radiology No results found.  Procedures Procedures (including critical care time)  Medications Ordered in UC Medications - No data to display  Initial Impression / Assessment and Plan / UC Course  I have reviewed the triage vital signs and the nursing notes.  Pertinent labs & imaging results that were available during my care of the patient were reviewed by me and considered in my medical decision making (see chart for details).     Vaginal discharge Treating for what is most likely a yeast infection.  Medication as prescribed  Hyperglycemia Most likely elevated from not taking medicine and also from eating a moderate amount of fruit before coming in. She takes metformin.  Recommended decreased food intake, watch her diet, drink plenty of water and check her blood sugars For any continued problems follow-up with primary care Final Clinical Impressions(s) / UC Diagnoses   Final diagnoses:  Vaginal discharge     Discharge Instructions     Treating you for a yeast infection Medicines as prescribed  Follow up as needed for continued or worsening symptoms    ED Prescriptions    Medication Sig Dispense Auth. Provider   fluconazole (DIFLUCAN) 150 MG tablet Take 1 tablet (150 mg total) by mouth daily. 2 tablet Jjesus Dingley A, NP   nystatin-triamcinolone (MYCOLOG  II) cream Apply to affected area daily 15 g Seila Liston A, NP     PDMP not reviewed this encounter.   Orvan July, NP 11/06/19 1032

## 2019-11-07 LAB — URINE CULTURE

## 2019-11-11 ENCOUNTER — Ambulatory Visit (INDEPENDENT_AMBULATORY_CARE_PROVIDER_SITE_OTHER): Payer: Medicare Other

## 2019-11-11 DIAGNOSIS — I442 Atrioventricular block, complete: Secondary | ICD-10-CM | POA: Diagnosis not present

## 2019-11-13 LAB — CUP PACEART REMOTE DEVICE CHECK
Battery Remaining Longevity: 123 mo
Battery Remaining Percentage: 95.5 %
Battery Voltage: 2.96 V
Brady Statistic AP VP Percent: 23 %
Brady Statistic AP VS Percent: 1 %
Brady Statistic AS VP Percent: 77 %
Brady Statistic AS VS Percent: 1 %
Brady Statistic RA Percent Paced: 23 %
Brady Statistic RV Percent Paced: 99 %
Date Time Interrogation Session: 20211108041711
Implantable Lead Implant Date: 20150202
Implantable Lead Implant Date: 20150202
Implantable Lead Location: 753859
Implantable Lead Location: 753860
Implantable Pulse Generator Implant Date: 20150202
Lead Channel Impedance Value: 460 Ohm
Lead Channel Impedance Value: 530 Ohm
Lead Channel Pacing Threshold Amplitude: 0.625 V
Lead Channel Pacing Threshold Amplitude: 0.75 V
Lead Channel Pacing Threshold Pulse Width: 0.4 ms
Lead Channel Pacing Threshold Pulse Width: 0.4 ms
Lead Channel Sensing Intrinsic Amplitude: 1 mV
Lead Channel Sensing Intrinsic Amplitude: 7.3 mV
Lead Channel Setting Pacing Amplitude: 0.875
Lead Channel Setting Pacing Amplitude: 2 V
Lead Channel Setting Pacing Pulse Width: 0.4 ms
Lead Channel Setting Sensing Sensitivity: 2 mV
Pulse Gen Model: 2240
Pulse Gen Serial Number: 7588973

## 2019-11-13 NOTE — Progress Notes (Signed)
Remote pacemaker transmission.   

## 2019-11-25 ENCOUNTER — Ambulatory Visit: Payer: Medicare Other | Attending: Internal Medicine

## 2019-11-25 DIAGNOSIS — Z23 Encounter for immunization: Secondary | ICD-10-CM

## 2019-11-25 NOTE — Progress Notes (Signed)
   Covid-19 Vaccination Clinic  Name:  DEZARAE MCCLARAN    MRN: [id number] DOB: 02/15/1950  11/25/2019  Ms. Dunkley was observed post Covid-19 immunization for 15 minutes without incident. She was provided with Vaccine Information Sheet and instruction to access the V-Safe system.   Ms. Linn was instructed to call 911 with any severe reactions post vaccine: Marland Kitchen Difficulty breathing  . Swelling of face and throat  . A fast heartbeat  . A bad rash all over body  . Dizziness and weakness   Immunizations Administered    No immunizations on file.

## 2020-02-10 ENCOUNTER — Ambulatory Visit (INDEPENDENT_AMBULATORY_CARE_PROVIDER_SITE_OTHER): Payer: Medicare Other

## 2020-02-10 DIAGNOSIS — I442 Atrioventricular block, complete: Secondary | ICD-10-CM | POA: Diagnosis not present

## 2020-02-12 LAB — CUP PACEART REMOTE DEVICE CHECK
Battery Remaining Longevity: 112 mo
Battery Remaining Percentage: 89 %
Battery Voltage: 2.95 V
Brady Statistic AP VP Percent: 22 %
Brady Statistic AP VS Percent: 1 %
Brady Statistic AS VP Percent: 78 %
Brady Statistic AS VS Percent: 1 %
Brady Statistic RA Percent Paced: 22 %
Brady Statistic RV Percent Paced: 99 %
Date Time Interrogation Session: 20220207125748
Implantable Lead Implant Date: 20150202
Implantable Lead Implant Date: 20150202
Implantable Lead Location: 753859
Implantable Lead Location: 753860
Implantable Pulse Generator Implant Date: 20150202
Lead Channel Impedance Value: 450 Ohm
Lead Channel Impedance Value: 490 Ohm
Lead Channel Pacing Threshold Amplitude: 0.625 V
Lead Channel Pacing Threshold Amplitude: 0.75 V
Lead Channel Pacing Threshold Pulse Width: 0.4 ms
Lead Channel Pacing Threshold Pulse Width: 0.4 ms
Lead Channel Sensing Intrinsic Amplitude: 3.1 mV
Lead Channel Sensing Intrinsic Amplitude: 6.4 mV
Lead Channel Setting Pacing Amplitude: 0.875
Lead Channel Setting Pacing Amplitude: 2 V
Lead Channel Setting Pacing Pulse Width: 0.4 ms
Lead Channel Setting Sensing Sensitivity: 2 mV
Pulse Gen Model: 2240
Pulse Gen Serial Number: 7588973

## 2020-02-17 NOTE — Progress Notes (Signed)
Remote pacemaker transmission.   

## 2020-03-09 ENCOUNTER — Other Ambulatory Visit: Payer: Self-pay | Admitting: Internal Medicine

## 2020-03-24 ENCOUNTER — Encounter: Payer: Self-pay | Admitting: Internal Medicine

## 2020-03-24 ENCOUNTER — Ambulatory Visit (INDEPENDENT_AMBULATORY_CARE_PROVIDER_SITE_OTHER): Payer: Medicare Other | Admitting: Internal Medicine

## 2020-03-24 ENCOUNTER — Other Ambulatory Visit: Payer: Self-pay

## 2020-03-24 VITALS — BP 114/76 | HR 71 | Ht 64.5 in | Wt 177.6 lb

## 2020-03-24 DIAGNOSIS — Z950 Presence of cardiac pacemaker: Secondary | ICD-10-CM | POA: Diagnosis not present

## 2020-03-24 DIAGNOSIS — I10 Essential (primary) hypertension: Secondary | ICD-10-CM

## 2020-03-24 DIAGNOSIS — I442 Atrioventricular block, complete: Secondary | ICD-10-CM

## 2020-03-24 NOTE — Progress Notes (Signed)
HPI [name] returns today for followup of HTN and CHB, s/p PPM insertion. She has HTN. She has managed to avoid getting Covid and has been vaccinated. She has class 2 CHF symptoms. She has lost almost 20 lbs in the last year. She has not had syncope and has minimal palpitations.  Allergies  Allergen Reactions  . Moxifloxacin Hives, Shortness Of Breath and Other (See Comments)    Caused syncope and throat tightness  . Penicillins Hives, Shortness Of Breath and Other (See Comments)    Caused syncope and throat tightness Has patient had a PCN reaction causing immediate rash, facial/tongue/throat swelling, SOB or lightheadedness with hypotension: Yes Has patient had a PCN reaction causing severe rash involving mucus membranes or skin necrosis: No Has patient had a PCN reaction that required hospitalization: hospital visit but not overnight Has patient had a PCN reaction occurring within the last 10 years: No If all of the above answers are "NO", then may proceed with Cephalosporin  . Arimidex [Anastrozole]      Current Outpatient Medications  Medication Sig Dispense Refill  . albuterol (PROVENTIL HFA;VENTOLIN HFA) 108 (90 Base) MCG/ACT inhaler Inhale 1-2 puffs into the lungs every 4 (four) hours as needed for wheezing or shortness of breath. 1 Inhaler 1  . amLODipine (NORVASC) 10 MG tablet Take 10 mg by mouth daily.    Marland Kitchen aspirin EC 81 MG tablet Take 81 mg by mouth daily.    . Budesonide 90 MCG/ACT inhaler Inhale 2 puffs into the lungs 2 (two) times daily.    . fluconazole (DIFLUCAN) 150 MG tablet Take 1 tablet (150 mg total) by mouth daily. 2 tablet 0  . fluticasone (FLONASE) 50 MCG/ACT nasal spray Place 1 spray into both nostrils daily. 16 g 0  . hydrochlorothiazide (HYDRODIURIL) 25 MG tablet Take 25 mg by mouth daily.     Marland Kitchen ipratropium (ATROVENT) 0.06 % nasal spray Place 2 sprays into both nostrils 4 (four) times daily. Prn nasal discharge 15 mL 12  . losartan (COZAAR) 100 MG  tablet Take 100 mg by mouth daily.    . metformin (FORTAMET) 1000 MG (OSM) 24 hr tablet Take 1,000 mg by mouth 2 (two) times daily.    Marland Kitchen nystatin-triamcinolone (MYCOLOG II) cream Apply to affected area daily 15 g 0  . omeprazole (PRILOSEC) 20 MG capsule TAKE 1 CAPSULE (20 MG TOTAL) BY MOUTH 2 (TWO) TIMES DAILY BEFORE A MEAL. 180 capsule 1  . Polyethyl Glycol-Propyl Glycol (SYSTANE OP) Place 1 drop into both eyes daily as needed (dry eyes).    Marland Kitchen lovastatin (MEVACOR) 10 MG tablet Take 1 tablet by mouth daily.     No current facility-administered medications for this visit.     Past Medical History:  Diagnosis Date  . Allergic rhinitis   . Arthritis    "left leg" (02/04/2013)  . Asthma   . Chronic bronchitis ([place])    "usually get it q yr" (02/04/2013)  . GERD (gastroesophageal reflux disease)   . Gout    "used to; haven't had it lately" (02/04/2013)  . Hypertension   . Pacemaker   . PONV (postoperative nausea and vomiting)   . Symptomatic bradycardia    s/p STJ dual chamber pacemaker by Dr Lovena Le 02/2013  . Type II diabetes mellitus (HCC)     ROS:   All systems reviewed and negative except as noted in the HPI.   Past Surgical History:  Procedure Laterality Date  . ABDOMINAL HYSTERECTOMY  ?  1980's  . BREAST BIOPSY Right    "benign"  . BREAST LUMPECTOMY Right    "benign"  . DILATION AND CURETTAGE OF UTERUS    . GANGLION CYST EXCISION Right 2003   Ganglion of wrist, volar and distal radius  . PACEMAKER INSERTION  02/04/2013   STJ Assurity dual chamber pacemaker implanted by Dr Lovena Le for symptomatic bradycardia  . PERMANENT PACEMAKER INSERTION N/A 02/04/2013   Procedure: PERMANENT PACEMAKER INSERTION;  Surgeon: Evans Lance, MD;  Location: [facility] CATH LAB;  Service: Cardiovascular;  Laterality: N/A;  . TUBAL LIGATION       Family History  Problem Relation Age of Onset  . Emphysema Father   . Breast cancer Mother      Social History   Socioeconomic History  . Marital status:  Divorced    Spouse name: Not on file  . Number of children: Not on file  . Years of education: Not on file  . Highest education level: Not on file  Occupational History  . Occupation: disabled   Tobacco Use  . Smoking status: Never Smoker  . Smokeless tobacco: Never Used  Vaping Use  . Vaping Use: Never used  Substance and Sexual Activity  . Alcohol use: Not Currently    Comment: rarely  . Drug use: No  . Sexual activity: Not on file  Other Topics Concern  . Not on file  Social History Narrative  . Not on file   Social Determinants of Health   Financial Resource Strain: Not on file  Food Insecurity: Not on file  Transportation Needs: Not on file  Physical Activity: Not on file  Stress: Not on file  Social Connections: Not on file  Intimate Partner Violence: Not on file     BP 114/76   Pulse 71   Ht 5' 4.5" (1.638 m)   Wt 177 lb 9.6 oz (80.6 kg)   SpO2 98%   BMI 30.01 kg/m   Physical Exam:  Well appearing NAD HEENT: Unremarkable Neck:  No JVD, no thyromegally Lymphatics:  No adenopathy Back:  No CVA tenderness Lungs:  Clear with no wheezes HEART:  Regular rate rhythm, no murmurs, no rubs, no clicks Abd:  soft, positive bowel sounds, no organomegally, no rebound, no guarding Ext:  2 plus pulses, no edema, no cyanosis, no clubbing Skin:  No rashes no nodules Neuro:  CN II through XII intact, motor grossly intact  EKG - nsr with ventricular pacing  DEVICE  Normal device function.  See PaceArt for details.   Assess/Plan: 1. CHB - she is asymptomatic, s/p PPM insertion.  2. HTN - her SBP is elevated though it is usually better at home. I encouraged her to try and lose weight and to increase her activity. She has lost 20 lbs. 3. PPM - Her [employer] DDD PM is working normally. We will recheck in several months 4. Obesity - she has lost over 20 lbs. She is encouraged to lose 10-15 more pounds.  [name],MD

## 2020-03-24 NOTE — Patient Instructions (Signed)
Medication Instructions:  Your physician recommends that you continue on your current medications as directed. Please refer to the Current Medication list given to you today.  Labwork: None ordered.  Testing/Procedures: None ordered.  Follow-Up: Your physician wants you to follow-up in: one year with Gregg Taylor, MD or one of the following Advanced Practice Providers on your designated Care Team:    Amber Seiler, NP  Renee Ursuy, PA-C  Michael "Andy" Tillery, PA-C  Remote monitoring is used to monitor your Pacemaker from home. This monitoring reduces the number of office visits required to check your device to one time per year. It allows us to keep an eye on the functioning of your device to ensure it is working properly. You are scheduled for a device check from home on 05/11/2020. You may send your transmission at any time that day. If you have a wireless device, the transmission will be sent automatically. After your physician reviews your transmission, you will receive a postcard with your next transmission date.  Any Other Special Instructions Will Be Listed Below (If Applicable).  If you need a refill on your cardiac medications before your next appointment, please call your pharmacy.       

## 2020-03-30 LAB — CUP PACEART INCLINIC DEVICE CHECK
Battery Remaining Longevity: 115 mo
Battery Voltage: 2.95 V
Brady Statistic RA Percent Paced: 22 %
Brady Statistic RV Percent Paced: 99.93 %
Date Time Interrogation Session: 20220322214102
Implantable Lead Implant Date: 20150202
Implantable Lead Implant Date: 20150202
Implantable Lead Location: 753859
Implantable Lead Location: 753860
Implantable Pulse Generator Implant Date: 20150202
Lead Channel Impedance Value: 475 Ohm
Lead Channel Impedance Value: 525 Ohm
Lead Channel Pacing Threshold Amplitude: 0.5 V
Lead Channel Pacing Threshold Amplitude: 0.75 V
Lead Channel Pacing Threshold Pulse Width: 0.4 ms
Lead Channel Pacing Threshold Pulse Width: 0.4 ms
Lead Channel Sensing Intrinsic Amplitude: 4.9 mV
Lead Channel Sensing Intrinsic Amplitude: 5 mV
Lead Channel Setting Pacing Amplitude: 0.75 V
Lead Channel Setting Pacing Amplitude: 2 V
Lead Channel Setting Pacing Pulse Width: 0.4 ms
Lead Channel Setting Sensing Sensitivity: 2 mV
Pulse Gen Model: 2240
Pulse Gen Serial Number: 7588973

## 2020-04-22 ENCOUNTER — Other Ambulatory Visit (HOSPITAL_BASED_OUTPATIENT_CLINIC_OR_DEPARTMENT_OTHER): Payer: Self-pay

## 2020-04-23 ENCOUNTER — Ambulatory Visit: Payer: Medicare Other

## 2020-05-11 ENCOUNTER — Ambulatory Visit (INDEPENDENT_AMBULATORY_CARE_PROVIDER_SITE_OTHER): Payer: Medicare Other

## 2020-05-11 DIAGNOSIS — I442 Atrioventricular block, complete: Secondary | ICD-10-CM | POA: Diagnosis not present

## 2020-05-12 LAB — CUP PACEART REMOTE DEVICE CHECK
Battery Remaining Longevity: 98 mo
Battery Remaining Percentage: 80 %
Battery Voltage: 2.93 V
Brady Statistic AP VP Percent: 26 %
Brady Statistic AP VS Percent: 1 %
Brady Statistic AS VP Percent: 73 %
Brady Statistic AS VS Percent: 1 %
Brady Statistic RA Percent Paced: 26 %
Brady Statistic RV Percent Paced: 99 %
Date Time Interrogation Session: 20220509084008
Implantable Lead Implant Date: 20150202
Implantable Lead Implant Date: 20150202
Implantable Lead Location: 753859
Implantable Lead Location: 753860
Implantable Pulse Generator Implant Date: 20150202
Lead Channel Impedance Value: 450 Ohm
Lead Channel Impedance Value: 480 Ohm
Lead Channel Pacing Threshold Amplitude: 0.625 V
Lead Channel Pacing Threshold Amplitude: 0.75 V
Lead Channel Pacing Threshold Pulse Width: 0.4 ms
Lead Channel Pacing Threshold Pulse Width: 0.4 ms
Lead Channel Sensing Intrinsic Amplitude: 1.9 mV
Lead Channel Sensing Intrinsic Amplitude: 6.9 mV
Lead Channel Setting Pacing Amplitude: 0.875
Lead Channel Setting Pacing Amplitude: 2 V
Lead Channel Setting Pacing Pulse Width: 0.4 ms
Lead Channel Setting Sensing Sensitivity: 2 mV
Pulse Gen Model: 2240
Pulse Gen Serial Number: 7588973

## 2020-05-29 NOTE — Progress Notes (Signed)
Remote pacemaker transmission.   

## 2020-08-10 ENCOUNTER — Ambulatory Visit (INDEPENDENT_AMBULATORY_CARE_PROVIDER_SITE_OTHER): Payer: Medicare Other

## 2020-08-10 DIAGNOSIS — I442 Atrioventricular block, complete: Secondary | ICD-10-CM | POA: Diagnosis not present

## 2020-08-12 LAB — CUP PACEART REMOTE DEVICE CHECK
Battery Remaining Longevity: 32 mo
Battery Remaining Percentage: 26 %
Battery Voltage: 2.93 V
Brady Statistic AP VP Percent: 34 %
Brady Statistic AP VS Percent: 1 %
Brady Statistic AS VP Percent: 66 %
Brady Statistic AS VS Percent: 1 %
Brady Statistic RA Percent Paced: 34 %
Brady Statistic RV Percent Paced: 99 %
Date Time Interrogation Session: 20220808092227
Implantable Lead Implant Date: 20150202
Implantable Lead Implant Date: 20150202
Implantable Lead Location: 753859
Implantable Lead Location: 753860
Implantable Pulse Generator Implant Date: 20150202
Lead Channel Impedance Value: 410 Ohm
Lead Channel Impedance Value: 450 Ohm
Lead Channel Pacing Threshold Amplitude: 0.75 V
Lead Channel Pacing Threshold Amplitude: 0.75 V
Lead Channel Pacing Threshold Pulse Width: 0.4 ms
Lead Channel Pacing Threshold Pulse Width: 0.4 ms
Lead Channel Sensing Intrinsic Amplitude: 3.7 mV
Lead Channel Sensing Intrinsic Amplitude: 6.1 mV
Lead Channel Setting Pacing Amplitude: 1 V
Lead Channel Setting Pacing Amplitude: 2 V
Lead Channel Setting Pacing Pulse Width: 0.4 ms
Lead Channel Setting Sensing Sensitivity: 2 mV
Pulse Gen Model: 2240
Pulse Gen Serial Number: 7588973

## 2020-08-29 ENCOUNTER — Other Ambulatory Visit: Payer: Self-pay | Admitting: Internal Medicine

## 2020-09-02 NOTE — Progress Notes (Signed)
Remote pacemaker transmission.   

## 2020-11-04 ENCOUNTER — Ambulatory Visit: Payer: Medicare Other | Attending: Internal Medicine

## 2020-11-04 ENCOUNTER — Other Ambulatory Visit (HOSPITAL_BASED_OUTPATIENT_CLINIC_OR_DEPARTMENT_OTHER): Payer: Self-pay

## 2020-11-04 DIAGNOSIS — Z23 Encounter for immunization: Secondary | ICD-10-CM

## 2020-11-04 MED ORDER — MODERNA COVID-19 BIVAL BOOSTER 50 MCG/0.5ML IM SUSP
INTRAMUSCULAR | 0 refills | Status: DC
Start: 2020-11-04 — End: 2022-08-13
  Filled 2020-11-04: qty 0.5, 1d supply, fill #0

## 2020-11-04 NOTE — Progress Notes (Signed)
   Covid-19 Vaccination Clinic  Name:  Jessica Carpenter    MRN: [id number] DOB: [birth year]  11/04/2020  Ms. Goosby was observed post Covid-19 immunization for 15 minutes without incident. She was provided with Vaccine Information Sheet and instruction to access the V-Safe system.   Ms. Spillane was instructed to call 911 with any severe reactions post vaccine: Difficulty breathing  Swelling of face and throat  A fast heartbeat  A bad rash all over body  Dizziness and weakness   Immunizations Administered     Name Date Dose VIS Date Route   Moderna Covid-19 vaccine Bivalent Booster 11/04/2020 11:22 AM 0.5 mL 08/15/2020 Intramuscular   Manufacturer: Moderna   Lot: [id number]   [place]: 99689-570-22

## 2020-11-09 ENCOUNTER — Ambulatory Visit (INDEPENDENT_AMBULATORY_CARE_PROVIDER_SITE_OTHER): Payer: Medicare Other

## 2020-11-09 DIAGNOSIS — I442 Atrioventricular block, complete: Secondary | ICD-10-CM

## 2020-11-10 LAB — CUP PACEART REMOTE DEVICE CHECK
Battery Remaining Longevity: 30 mo
Battery Remaining Percentage: 24 %
Battery Voltage: 2.92 V
Brady Statistic AP VP Percent: 40 %
Brady Statistic AP VS Percent: 1 %
Brady Statistic AS VP Percent: 60 %
Brady Statistic AS VS Percent: 1 %
Brady Statistic RA Percent Paced: 40 %
Brady Statistic RV Percent Paced: 99 %
Date Time Interrogation Session: 20221107010026
Implantable Lead Implant Date: 20150202
Implantable Lead Implant Date: 20150202
Implantable Lead Location: 753859
Implantable Lead Location: 753860
Implantable Pulse Generator Implant Date: 20150202
Lead Channel Impedance Value: 450 Ohm
Lead Channel Impedance Value: 490 Ohm
Lead Channel Pacing Threshold Amplitude: 0.75 V
Lead Channel Pacing Threshold Amplitude: 0.75 V
Lead Channel Pacing Threshold Pulse Width: 0.4 ms
Lead Channel Pacing Threshold Pulse Width: 0.4 ms
Lead Channel Sensing Intrinsic Amplitude: 4.3 mV
Lead Channel Sensing Intrinsic Amplitude: 7.2 mV
Lead Channel Setting Pacing Amplitude: 1 V
Lead Channel Setting Pacing Amplitude: 2 V
Lead Channel Setting Pacing Pulse Width: 0.4 ms
Lead Channel Setting Sensing Sensitivity: 2 mV
Pulse Gen Model: 2240
Pulse Gen Serial Number: 7588973

## 2020-11-13 NOTE — Progress Notes (Signed)
Remote pacemaker transmission.   

## 2020-12-30 ENCOUNTER — Ambulatory Visit: Payer: Medicare Other | Admitting: Podiatry

## 2021-02-02 ENCOUNTER — Encounter: Payer: Self-pay | Admitting: Podiatry

## 2021-02-02 ENCOUNTER — Ambulatory Visit (INDEPENDENT_AMBULATORY_CARE_PROVIDER_SITE_OTHER): Payer: Medicare Other | Admitting: Podiatry

## 2021-02-02 ENCOUNTER — Other Ambulatory Visit: Payer: Self-pay

## 2021-02-02 DIAGNOSIS — B351 Tinea unguium: Secondary | ICD-10-CM | POA: Diagnosis not present

## 2021-02-02 NOTE — Progress Notes (Signed)
°  Subjective:  Patient ID: Jessica Carpenter, female    DOB: [birth year],   MRN: [id number]  Chief Complaint  Patient presents with   DEBRIDE    Pt is here for [facility]    71 y.o. female presents for concern of thickened elongated and painful toenails.  Patient relates she is primarily concern about the right second digit that is thick and discolored. Had been given terbinafine in the past but relates she never really took the oral medication because she forgot. Relates she is diabetic but cannot find diagnosis in chart and cannot see most recent A1c and patient does not recall Has had sugars from about a year ago running in the 300s and 400s.    . Denies any other pedal complaints. Denies n/v/f/c.   Past Medical History:  Diagnosis Date   Allergic rhinitis    Arthritis    "left leg" (02/04/2013)   Asthma    Chronic bronchitis ([place])    "usually get it q yr" (02/04/2013)   GERD (gastroesophageal reflux disease)    Gout    "used to; haven't had it lately" (02/04/2013)   Hypertension    Pacemaker    PONV (postoperative nausea and vomiting)    Symptomatic bradycardia    s/p STJ dual chamber pacemaker by Dr Lovena Le 02/2013   Type II diabetes mellitus (HCC)     Objective:  Physical Exam: Vascular: DP/PT pulses 2/4 bilateral. CFT <3 seconds. Normal hair growth on digits. No edema.  Skin. No lacerations or abrasions bilateral feet. Nails 1-5 are thickened discolored and elongated with subungual debris.  Right second digit nail with black discoloration streaking from distal to proximal and darkness noted around eponychium and around proximal nail fold.  Musculoskeletal: MMT 5/5 bilateral lower extremities in DF, PF, Inversion and Eversion. Deceased ROM in DF of ankle joint.  Neurological: Sensation intact to light touch.   Assessment:   1. Onychomycosis      Plan:  Patient was evaluated and treated and all questions answered. -Examined patient -Discussed treatment options for painful dystrophic  nails and longitudinal melonychia.  -Clinical picture and Fungal culture was obtained by removing a portion of the hard nail itself from each of the involved toenails using a sterile nail nipper and sent to [facility] lab. Patient tolerated the biopsy procedure well without discomfort or need for anesthesia.  -Discussed fungal nail treatment options including oral, topical, and laser treatments.  -Patient to return in 4 weeks for follow up evaluation and discussion of fungal culture results or sooner if symptoms worsen.   Lorenda Peck, DPM

## 2021-02-08 ENCOUNTER — Ambulatory Visit (INDEPENDENT_AMBULATORY_CARE_PROVIDER_SITE_OTHER): Payer: Medicare Other

## 2021-02-08 DIAGNOSIS — I442 Atrioventricular block, complete: Secondary | ICD-10-CM

## 2021-02-09 LAB — CUP PACEART REMOTE DEVICE CHECK
Battery Remaining Longevity: 26 mo
Battery Remaining Percentage: 21 %
Battery Voltage: 2.92 V
Brady Statistic AP VP Percent: 37 %
Brady Statistic AP VS Percent: 1 %
Brady Statistic AS VP Percent: 63 %
Brady Statistic AS VS Percent: 1 %
Brady Statistic RA Percent Paced: 37 %
Brady Statistic RV Percent Paced: 99 %
Date Time Interrogation Session: 20230206194618
Implantable Lead Implant Date: 20150202
Implantable Lead Implant Date: 20150202
Implantable Lead Location: 753859
Implantable Lead Location: 753860
Implantable Pulse Generator Implant Date: 20150202
Lead Channel Impedance Value: 440 Ohm
Lead Channel Impedance Value: 450 Ohm
Lead Channel Pacing Threshold Amplitude: 0.625 V
Lead Channel Pacing Threshold Amplitude: 0.75 V
Lead Channel Pacing Threshold Pulse Width: 0.4 ms
Lead Channel Pacing Threshold Pulse Width: 0.4 ms
Lead Channel Sensing Intrinsic Amplitude: 3.6 mV
Lead Channel Sensing Intrinsic Amplitude: 5.8 mV
Lead Channel Setting Pacing Amplitude: 0.875
Lead Channel Setting Pacing Amplitude: 2 V
Lead Channel Setting Pacing Pulse Width: 0.4 ms
Lead Channel Setting Sensing Sensitivity: 2 mV
Pulse Gen Model: 2240
Pulse Gen Serial Number: 7588973

## 2021-02-11 NOTE — Progress Notes (Signed)
Remote pacemaker transmission.   

## 2021-02-17 ENCOUNTER — Encounter: Payer: Self-pay | Admitting: Podiatry

## 2021-03-02 ENCOUNTER — Encounter: Payer: Self-pay | Admitting: Podiatry

## 2021-03-02 ENCOUNTER — Other Ambulatory Visit: Payer: Self-pay

## 2021-03-02 ENCOUNTER — Ambulatory Visit (INDEPENDENT_AMBULATORY_CARE_PROVIDER_SITE_OTHER): Payer: Medicare Other | Admitting: Podiatry

## 2021-03-02 DIAGNOSIS — B351 Tinea unguium: Secondary | ICD-10-CM

## 2021-03-02 NOTE — Progress Notes (Signed)
°  Subjective:  Patient ID: Jessica Carpenter, female    DOB: 10-10-1950,   MRN: [id number]  Chief Complaint  Patient presents with   Follow-up    Following up on [place] results- nail fungal     71 y.o. female presents for follow-up of fungal nails and culture results.     . Denies any other pedal complaints. Denies n/v/f/c.   Past Medical History:  Diagnosis Date   Allergic rhinitis    Arthritis    "left leg" (02/04/2013)   Asthma    Chronic bronchitis ([place])    "usually get it q yr" (02/04/2013)   GERD (gastroesophageal reflux disease)    Gout    "used to; haven't had it lately" (02/04/2013)   Hypertension    Pacemaker    PONV (postoperative nausea and vomiting)    Symptomatic bradycardia    s/p STJ dual chamber pacemaker by Dr Lovena Le 02/2013   Type II diabetes mellitus (HCC)     Objective:  Physical Exam: Vascular: DP/PT pulses 2/4 bilateral. CFT <3 seconds. Normal hair growth on digits. No edema.  Skin. No lacerations or abrasions bilateral feet. Nails 1-5 are thickened discolored and elongated with subungual debris.  Right second digit nail with black discoloration streaking from distal to proximal and darkness noted around eponychium and around proximal nail fold.  Musculoskeletal: MMT 5/5 bilateral lower extremities in DF, PF, Inversion and Eversion. Deceased ROM in DF of ankle joint.  Neurological: Sensation intact to light touch.   Assessment:   No diagnosis found.    Plan:  Patient was evaluated and treated and all questions answered. -Examined patient -Discussed treatment options for painful dystrophic nails and longitudinal melonychia.  Discussed results of culture with patient which shoe trauma and melanonychia. Patient to try urea nail gel.  -Patient will follow-up as needed.   Lorenda Peck, DPM

## 2021-03-04 ENCOUNTER — Institutional Professional Consult (permissible substitution): Payer: Medicare Other | Admitting: Pulmonary Disease

## 2021-03-18 ENCOUNTER — Ambulatory Visit (INDEPENDENT_AMBULATORY_CARE_PROVIDER_SITE_OTHER): Payer: Medicare Other | Admitting: Pulmonary Disease

## 2021-03-18 ENCOUNTER — Encounter: Payer: Self-pay | Admitting: Pulmonary Disease

## 2021-03-18 ENCOUNTER — Other Ambulatory Visit: Payer: Self-pay

## 2021-03-18 VITALS — BP 132/86 | HR 60 | Ht 64.5 in | Wt 179.4 lb

## 2021-03-18 DIAGNOSIS — B370 Candidal stomatitis: Secondary | ICD-10-CM

## 2021-03-18 DIAGNOSIS — J4530 Mild persistent asthma, uncomplicated: Secondary | ICD-10-CM | POA: Diagnosis not present

## 2021-03-18 DIAGNOSIS — R0982 Postnasal drip: Secondary | ICD-10-CM | POA: Diagnosis not present

## 2021-03-18 MED ORDER — NYSTATIN 100000 UNIT/ML MT SUSP: OROMUCOSAL | 6 refills | Status: AC

## 2021-03-18 MED ORDER — IPRATROPIUM BROMIDE 0.03 % NA SOLN: 2.0000 | Freq: Two times a day (BID) | NASAL | 12 refills | Status: DC

## 2021-03-18 NOTE — Progress Notes (Deleted)
Patient seen in the office today and instructed on use of Spiriva 2.44mg.  Patient expressed understanding and demonstrated technique. ? ?CBenetta SparCMA 03/18/2021 ?

## 2021-03-18 NOTE — Patient Instructions (Addendum)
Recommend taking pulmicort 1 puff twice daily, scheduled every day ?- rinse mouth out after each use ? ?Use albuterol inhaler as needed ? ?Continue montelukast '10mg'$  daily for allergies/asthma ? ?Continue fluticasone nasal spray, 1-2 sprays per nostril daily ? ?Use ipratropium nasal spray as need 1-2 times per day, 2 spray per nostril each time.  ? ?Recommend using zyrtec or allegra daily during the spring season for allergies.  ? ?Use nystatin rinse as needed for thrush ? ?Follow up in 6 months ? ? ?

## 2021-03-18 NOTE — Progress Notes (Signed)
? ?Synopsis: Referred in March 2023 for Asthma by Vanita Panda, MD ? ?Subjective:  ? ?PATIENT ID: Jessica Carpenter GENDER: female DOB: 1950/06/30, MRN: [id number] ? ?HPI ? ?Chief Complaint  ?Patient presents with  ? Consult  ?  Referred by PCP for history of asthma. States she has been noticing that the SOB has been increasing at night.   ? ?Jessica Carpenter is a 71 year old woman, never smoker with GERD, hypertension, complete heart block s/p pacemaker and DMII who is referred to pulmonary clinic for asthma.  ? ?She was last seen in clinic in 2016 by Dr. Lamonte Sakai. PFTs showed mild obstruction based on flow volume loop. She was being treated with advair 250-30mg 1 puff twice daily at that time. She was also being treated for GERD and allergies at that time as aggravating factors of her asthma.  ? ?She was using symbicort as needed before. She reports it was making her chest and throat sore. She reports episode of respiratory distress where she lost consciousness after coughing and losing her breath. She reports coughing and passing out. PCP changed her to pulmicort inhaler 978m which does not hurt her chest or throat.  ? ?She experiences occasoinal dyspnea, cough and wheezing. She notices the dyspnea and wheezing at night sometimes that is relieved by albuterol.  ? ?She does report spring allergies.  ? ?She is a never smoker. She does report second hand smoke from her father.  ? ?Past Medical History:  ?Diagnosis Date  ? Allergic rhinitis   ? Arthritis   ? "left leg" (02/04/2013)  ? Asthma   ? [place]  ? "usually get it q yr" (02/04/2013)  ? GERD (gastroesophageal reflux disease)   ? Gout   ? "used to; haven't had it lately" (02/04/2013)  ? Hypertension   ? Pacemaker   ? PONV (postoperative nausea and vomiting)   ? Symptomatic bradycardia   ? s/p STJ dual chamber pacemaker by Dr TaLovena Le/2015  ? [place] II [place] ([place]  ?  ? ?Family History  ?Problem Relation Age of Onset  ? Emphysema Father   ?  Breast cancer Mother   ?  ? ?Social History  ? ?Socioeconomic History  ? Marital status: Divorced  ?  Spouse name: Not on file  ? Number of children: Not on file  ? Years of education: Not on file  ? Highest education level: Not on file  ?Occupational History  ? Occupation: disabled   ?Tobacco Use  ? Smoking status: Never  ? Smokeless tobacco: Never  ?Vaping Use  ? Vaping Use: Never used  ?Substance and Sexual Activity  ? Alcohol use: Not Currently  ?  Comment: rarely  ? Drug use: No  ? Sexual activity: Not on file  ?Other Topics Concern  ? Not on file  ?Social History Narrative  ? Not on file  ? ?Social Determinants of Health  ? ?Financial Resource Strain: Not on file  ?Food Insecurity: Not on file  ?Transportation Needs: Not on file  ?Physical Activity: Not on file  ?Stress: Not on file  ?Social Connections: Not on file  ?Intimate Partner Violence: Not on file  ?  ? ?Allergies  ?Allergen Reactions  ? Moxifloxacin Hives, Shortness Of Breath and Other (See Comments)  ?  Caused syncope and throat tightness  ? Penicillins Hives, Shortness Of Breath and Other (See Comments)  ?  Caused syncope and throat tightness ?Has patient had a PCN reaction causing immediate  rash, facial/tongue/throat swelling, SOB or lightheadedness with hypotension: Yes ?Has patient had a PCN reaction causing severe rash involving mucus membranes or skin necrosis: No ?Has patient had a PCN reaction that required hospitalization: hospital visit but not overnight ?Has patient had a PCN reaction occurring within the last 10 years: No ?If all of the above answers are "NO", then may proceed with Cephalosporin  ? Arimidex [Anastrozole]   ?  ? ?Outpatient Medications Prior to Visit  ?Medication Sig Dispense Refill  ? albuterol (PROVENTIL HFA;VENTOLIN HFA) 108 (90 Base) MCG/ACT inhaler Inhale 1-2 puffs into the lungs every 4 (four) hours as needed for wheezing or shortness of breath. 1 Inhaler 1  ? amLODipine (NORVASC) 10 MG tablet Take 10 mg by mouth  daily.    ? aspirin EC 81 MG tablet Take 81 mg by mouth daily.    ? Budesonide 90 MCG/ACT inhaler Inhale 2 puffs into the lungs 2 (two) times daily.    ? COVID-19 mRNA bivalent vaccine, Moderna, (MODERNA COVID-19 BIVAL BOOSTER) 50 MCG/0.5ML injection Inject into the muscle. 0.5 mL 0  ? [employer] PURELAX 17 GM/SCOOP powder Take by mouth daily.    ? fluconazole (DIFLUCAN) 150 MG tablet Take 1 tablet (150 mg total) by mouth daily. 2 tablet 0  ? fluticasone (FLONASE) 50 MCG/ACT nasal spray Place 1 spray into both nostrils daily. 16 g 0  ? hydrochlorothiazide (HYDRODIURIL) 25 MG tablet Take 25 mg by mouth daily.     ? JARDIANCE 25 MG TABS tablet Take 25 mg by mouth every morning.    ? losartan (COZAAR) 100 MG tablet Take 100 mg by mouth daily.    ? lovastatin (MEVACOR) 10 MG tablet Take 1 tablet by mouth daily.    ? metformin (FORTAMET) 1000 MG (OSM) 24 hr tablet Take 1,000 mg by mouth 2 (two) times daily.    ? metFORMIN (GLUCOPHAGE) 1000 MG tablet SMARTSIG:1 Tablet(s) By Mouth Morning-Evening    ? montelukast (SINGULAIR) 10 MG tablet SMARTSIG:1 Tablet(s) By Mouth Every Evening    ? nystatin-triamcinolone (MYCOLOG II) cream Apply to affected area daily 15 g 0  ? omeprazole (PRILOSEC) 20 MG capsule TAKE 1 CAPSULE (20 MG TOTAL) BY MOUTH 2 (TWO) TIMES DAILY BEFORE A MEAL. 180 capsule 2  ? Polyethyl Glycol-Propyl Glycol (SYSTANE OP) Place 1 drop into both eyes daily as needed (dry eyes).    ? ipratropium (ATROVENT) 0.06 % nasal spray Place 2 sprays into both nostrils 4 (four) times daily. Prn nasal discharge 15 mL 12  ? nystatin (MYCOSTATIN) 100000 UNIT/ML suspension SMARTSIG:4 Milliliter(s) By Mouth 4 Times Daily    ? SYMBICORT 80-4.5 MCG/ACT inhaler SMARTSIG:2 Puff(s) By Mouth Morning-Evening    ? ?No facility-administered medications prior to visit.  ? ?Review of Systems  ?Constitutional:  Negative for chills, fever, malaise/fatigue and weight loss.  ?HENT:  Positive for congestion. Negative for sinus pain and sore throat.    ?Eyes: Negative.   ?Respiratory:  Positive for shortness of breath. Negative for cough, hemoptysis, sputum production and wheezing.   ?Cardiovascular:  Negative for chest pain, palpitations, orthopnea, claudication and leg swelling.  ?Gastrointestinal:  Positive for heartburn. Negative for abdominal pain, nausea and vomiting.  ?Genitourinary: Negative.   ?Musculoskeletal:  Positive for joint pain. Negative for myalgias.  ?Skin:  Negative for rash.  ?Neurological:  Negative for weakness.  ?Endo/Heme/Allergies: Negative.   ?Psychiatric/Behavioral: Negative.    ? ?Objective:  ? ?Vitals:  ? 03/18/21 0923  ?BP: 132/86  ?Pulse: 60  ?SpO2: 100%  ?  Weight: 179 lb 6.4 oz (81.4 kg)  ?Height: 5' 4.5" (1.638 m)  ? ? ?Physical Exam ?Constitutional:   ?   General: She is not in acute distress. ?   Appearance: She is not ill-appearing.  ?HENT:  ?   Head: Normocephalic and atraumatic.  ?   Nose: Nose normal. No congestion.  ?   Mouth/Throat:  ?   Mouth: Mucous membranes are moist.  ?   Pharynx: Oropharynx is clear.  ?Eyes:  ?   General: No scleral icterus. ?   Conjunctiva/sclera: Conjunctivae normal.  ?   Pupils: Pupils are equal, round, and reactive to light.  ?Cardiovascular:  ?   Rate and Rhythm: Normal rate and regular rhythm.  ?   Pulses: Normal pulses.  ?   Heart sounds: Normal heart sounds. No murmur heard. ?Pulmonary:  ?   Effort: Pulmonary effort is normal.  ?   Breath sounds: Normal breath sounds. No wheezing, rhonchi or rales.  ?Abdominal:  ?   General: Bowel sounds are normal.  ?   Palpations: Abdomen is soft.  ?Musculoskeletal:  ?   Right lower leg: No edema.  ?   Left lower leg: No edema.  ?Lymphadenopathy:  ?   Cervical: No cervical adenopathy.  ?Skin: ?   General: Skin is warm and dry.  ?Neurological:  ?   General: No focal deficit present.  ?   Mental Status: She is alert.  ?Psychiatric:     ?   Mood and Affect: Mood normal.     ?   Behavior: Behavior normal.     ?   Thought Content: Thought content normal.     ?    Judgment: Judgment normal.  ? ?CBC ?   ?Component Value Date/Time  ? WBC 7.0 09/26/2015 0930  ? RBC 5.28 (H) 09/26/2015 0930  ? HGB 14.7 09/26/2015 0930  ? HCT 44.8 09/26/2015 0930  ? PLT 260 09/26/2015 0

## 2021-04-08 ENCOUNTER — Encounter: Payer: Self-pay | Admitting: Internal Medicine

## 2021-04-08 ENCOUNTER — Ambulatory Visit (INDEPENDENT_AMBULATORY_CARE_PROVIDER_SITE_OTHER): Payer: Medicare Other | Admitting: Internal Medicine

## 2021-04-08 VITALS — BP 126/72 | HR 60 | Ht 64.5 in | Wt 180.8 lb

## 2021-04-08 DIAGNOSIS — I442 Atrioventricular block, complete: Secondary | ICD-10-CM | POA: Diagnosis not present

## 2021-04-08 DIAGNOSIS — I10 Essential (primary) hypertension: Secondary | ICD-10-CM

## 2021-04-08 DIAGNOSIS — Z950 Presence of cardiac pacemaker: Secondary | ICD-10-CM

## 2021-04-08 LAB — CUP PACEART INCLINIC DEVICE CHECK
Battery Remaining Longevity: 25 mo
Battery Voltage: 2.9 V
Brady Statistic RA Percent Paced: 36 %
Brady Statistic RV Percent Paced: 99.85 %
Date Time Interrogation Session: 20230406162715
Implantable Lead Implant Date: 20150202
Implantable Lead Implant Date: 20150202
Implantable Lead Location: 753859
Implantable Lead Location: 753860
Implantable Pulse Generator Implant Date: 20150202
Lead Channel Impedance Value: 475 Ohm
Lead Channel Impedance Value: 487.5 Ohm
Lead Channel Pacing Threshold Amplitude: 0.75 V
Lead Channel Pacing Threshold Amplitude: 0.75 V
Lead Channel Pacing Threshold Amplitude: 0.75 V
Lead Channel Pacing Threshold Amplitude: 0.75 V
Lead Channel Pacing Threshold Pulse Width: 0.4 ms
Lead Channel Pacing Threshold Pulse Width: 0.4 ms
Lead Channel Pacing Threshold Pulse Width: 0.4 ms
Lead Channel Pacing Threshold Pulse Width: 0.4 ms
Lead Channel Sensing Intrinsic Amplitude: 4.5 mV
Lead Channel Sensing Intrinsic Amplitude: 5 mV
Lead Channel Setting Pacing Amplitude: 0.875
Lead Channel Setting Pacing Amplitude: 2 V
Lead Channel Setting Pacing Pulse Width: 0.4 ms
Lead Channel Setting Sensing Sensitivity: 2 mV
Pulse Gen Model: 2240
Pulse Gen Serial Number: 7588973

## 2021-04-08 NOTE — Patient Instructions (Signed)
Medication Instructions:  ?Your physician recommends that you continue on your current medications as directed. Please refer to the Current Medication list given to you today. ? ?Labwork: ?None ordered. ? ?Testing/Procedures: ?None ordered. ? ?Follow-Up: ?Your physician wants you to follow-up in: one year with Cristopher Peru, MD or one of the following Advanced Practice Providers on your designated Care Team:   ?Tommye Standard, PA-C ?Legrand Como "Jonni Sanger" [place], PA-C ? ?Remote monitoring is used to monitor your Pacemaker from home. This monitoring reduces the number of office visits required to check your device to one time per year. It allows [place] to keep an eye on the functioning of your device to ensure it is working properly. You are scheduled for a device check from home on 05/10/2021. You may send your transmission at any time that day. If you have a wireless device, the transmission will be sent automatically. After your physician reviews your transmission, you will receive a postcard with your next transmission date. ? ?Any Other Special Instructions Will Be Listed Below (If Applicable). ? ?If you need a refill on your cardiac medications before your next appointment, please call your pharmacy.  ? ? ? ? ?

## 2021-04-08 NOTE — Progress Notes (Signed)
? ? ? ? ?HPI ?Jessica Carpenter returns today for followup of HTN and CHB, s/p PPM insertion. She has HTN. She has managed to avoid getting Covid and has been vaccinated. She has class 2 CHF symptoms. She has not lost any additional weight. She has not had syncope and has minimal palpitations.  ?Allergies  ?Allergen Reactions  ? Moxifloxacin Hives, Shortness Of Breath and Other (See Comments)  ?  Caused syncope and throat tightness  ? Penicillins Hives, Shortness Of Breath and Other (See Comments)  ?  Caused syncope and throat tightness ?Has patient had a PCN reaction causing immediate rash, facial/tongue/throat swelling, SOB or lightheadedness with hypotension: Yes ?Has patient had a PCN reaction causing severe rash involving mucus membranes or skin necrosis: No ?Has patient had a PCN reaction that required hospitalization: hospital visit but not overnight ?Has patient had a PCN reaction occurring within the last 10 years: No ?If all of the above answers are "NO", then may proceed with Cephalosporin  ? Arimidex [Anastrozole]   ? ? ? ?Current Outpatient Medications  ?Medication Sig Dispense Refill  ? albuterol (PROVENTIL HFA;VENTOLIN HFA) 108 (90 Base) MCG/ACT inhaler Inhale 1-2 puffs into the lungs every 4 (four) hours as needed for wheezing or shortness of breath. 1 Inhaler 1  ? amLODipine (NORVASC) 10 MG tablet Take 10 mg by mouth daily.    ? aspirin EC 81 MG tablet Take 81 mg by mouth daily.    ? Budesonide 90 MCG/ACT inhaler Inhale 2 puffs into the lungs 2 (two) times daily.    ? COVID-19 mRNA bivalent vaccine, Moderna, (MODERNA COVID-19 BIVAL BOOSTER) 50 MCG/0.5ML injection Inject into the muscle. 0.5 mL 0  ? [employer] PURELAX 17 GM/SCOOP powder Take by mouth daily.    ? fluconazole (DIFLUCAN) 150 MG tablet Take 1 tablet (150 mg total) by mouth daily. 2 tablet 0  ? fluticasone (FLONASE) 50 MCG/ACT nasal spray Place 1 spray into both nostrils daily. 16 g 0  ? hydrochlorothiazide (HYDRODIURIL) 25 MG tablet Take 25 mg by  mouth daily.     ? ipratropium (ATROVENT) 0.03 % nasal spray Place 2 sprays into both nostrils every 12 (twelve) hours. 30 mL 12  ? JARDIANCE 25 MG TABS tablet Take 25 mg by mouth every morning.    ? losartan (COZAAR) 100 MG tablet Take 100 mg by mouth daily.    ? lovastatin (MEVACOR) 10 MG tablet Take 1 tablet by mouth daily.    ? metformin (FORTAMET) 1000 MG (OSM) 24 hr tablet Take 1,000 mg by mouth 2 (two) times daily.    ? metFORMIN (GLUCOPHAGE) 1000 MG tablet SMARTSIG:1 Tablet(s) By Mouth Morning-Evening    ? montelukast (SINGULAIR) 10 MG tablet SMARTSIG:1 Tablet(s) By Mouth Every Evening    ? nystatin (MYCOSTATIN) 100000 UNIT/ML suspension SMARTSIG:4 Milliliter(s) By Mouth 4 Times Daily 60 mL 6  ? nystatin-triamcinolone (MYCOLOG II) cream Apply to affected area daily 15 g 0  ? omeprazole (PRILOSEC) 20 MG capsule TAKE 1 CAPSULE (20 MG TOTAL) BY MOUTH 2 (TWO) TIMES DAILY BEFORE A MEAL. 180 capsule 2  ? Polyethyl Glycol-Propyl Glycol (SYSTANE OP) Place 1 drop into both eyes daily as needed (dry eyes).    ? ?No current facility-administered medications for this visit.  ? ? ? ?Past Medical History:  ?Diagnosis Date  ? Allergic rhinitis   ? Arthritis   ? "left leg" (02/04/2013)  ? Asthma   ? Chronic bronchitis ([place])   ? "usually get it q yr" (02/04/2013)  ?  GERD (gastroesophageal reflux disease)   ? Gout   ? "used to; haven't had it lately" (02/04/2013)  ? Hypertension   ? Pacemaker   ? PONV (postoperative nausea and vomiting)   ? Symptomatic bradycardia   ? s/p STJ dual chamber pacemaker by Dr Lovena Le 02/2013  ? Type II diabetes mellitus ([place])   ? ? ?ROS: ? ? All systems reviewed and negative except as noted in the HPI. ? ? ?Past Surgical History:  ?Procedure Laterality Date  ? ABDOMINAL HYSTERECTOMY  ?1980's  ? BREAST BIOPSY Right   ? "benign"  ? BREAST LUMPECTOMY Right   ? "benign"  ? DILATION AND CURETTAGE OF UTERUS    ? GANGLION CYST EXCISION Right 2003  ? Ganglion of wrist, volar and distal radius  ? PACEMAKER  INSERTION  02/04/2013  ? STJ [employer]urity dual chamber pacemaker implanted by Dr Lovena Le for symptomatic bradycardia  ? PERMANENT PACEMAKER INSERTION N/A 02/04/2013  ? Procedure: PERMANENT PACEMAKER INSERTION;  Surgeon: Evans Lance, MD;  [facility];  Service: Cardiovascular;  Laterality: N/A;  ? TUBAL LIGATION    ? ? ? ?Family History  ?Problem Relation Age of Onset  ? Emphysema Father   ? Breast cancer Mother   ? ? ? ?Social History  ? ?Socioeconomic History  ? Marital status: Divorced  ?  Spouse name: Not on file  ? Number of children: Not on file  ? Years of education: Not on file  ? Highest education level: Not on file  ?Occupational History  ? Occupation: disabled   ?Tobacco Use  ? Smoking status: Never  ? Smokeless tobacco: Never  ?Vaping Use  ? Vaping Use: Never used  ?Substance and Sexual Activity  ? Alcohol use: Not Currently  ?  Comment: rarely  ? Drug use: No  ? Sexual activity: Not on file  ?Other Topics Concern  ? Not on file  ?Social History Narrative  ? Not on file  ? ?Social Determinants of Health  ? ?Financial Resource Strain: Not on file  ?Food Insecurity: Not on file  ?Transportation Needs: Not on file  ?Physical Activity: Not on file  ?Stress: Not on file  ?Social Connections: Not on file  ?Intimate Partner Violence: Not on file  ? ? ? ?BP 126/72   Pulse 60   Ht 5' 4.5" (1.638 m)   Wt 180 lb 12.8 oz (82 kg)   SpO2 97%   BMI 30.55 kg/m?  ? ?Physical Exam: ? ?Well appearing NAD ?HEENT: Unremarkable ?Neck:  No JVD, no thyromegally ?Lymphatics:  No adenopathy ?Back:  No CVA tenderness ?Lungs:  Clear ?HEART:  Regular rate rhythm, no murmurs, no rubs, no clicks ?Abd:  soft, positive bowel sounds, no organomegally, no rebound, no guarding ?Ext:  2 plus pulses, no edema, no cyanosis, no clubbing ?Skin:  No rashes no nodules ?Neuro:  CN II through XII intact, motor grossly intact ? ?EKG - AV pacing ? ?DEVICE  ?Normal device function.  See PaceArt for details.  ? ?Assess/Plan:  ?CHB - she is  asymptomatic s/p PPM insertion.  ?PPM - her [employer] DDD PM is working normally. She is about 2 years from ERI. ?HTN - her bp is controlled. No changed. ?Obesity - she is encouraged to lose weight.  ? ?Jessica Overlie Genora Arp,MD ?

## 2021-05-10 ENCOUNTER — Ambulatory Visit (INDEPENDENT_AMBULATORY_CARE_PROVIDER_SITE_OTHER): Payer: Medicare Other

## 2021-05-10 DIAGNOSIS — I442 Atrioventricular block, complete: Secondary | ICD-10-CM | POA: Diagnosis not present

## 2021-05-11 LAB — CUP PACEART REMOTE DEVICE CHECK
Battery Remaining Longevity: 23 mo
Battery Remaining Percentage: 19 %
Battery Voltage: 2.9 V
Brady Statistic AP VP Percent: 36 %
Brady Statistic AP VS Percent: 1 %
Brady Statistic AS VP Percent: 64 %
Brady Statistic AS VS Percent: 1 %
Brady Statistic RA Percent Paced: 36 %
Brady Statistic RV Percent Paced: 99 %
Date Time Interrogation Session: 20230508072834
Implantable Lead Implant Date: 20150202
Implantable Lead Implant Date: 20150202
Implantable Lead Location: 753859
Implantable Lead Location: 753860
Implantable Pulse Generator Implant Date: 20150202
Lead Channel Impedance Value: 450 Ohm
Lead Channel Impedance Value: 460 Ohm
Lead Channel Pacing Threshold Amplitude: 0.75 V
Lead Channel Pacing Threshold Amplitude: 0.75 V
Lead Channel Pacing Threshold Pulse Width: 0.4 ms
Lead Channel Pacing Threshold Pulse Width: 0.4 ms
Lead Channel Sensing Intrinsic Amplitude: 2.9 mV
Lead Channel Sensing Intrinsic Amplitude: 5.4 mV
Lead Channel Setting Pacing Amplitude: 1 V
Lead Channel Setting Pacing Amplitude: 2 V
Lead Channel Setting Pacing Pulse Width: 0.4 ms
Lead Channel Setting Sensing Sensitivity: 2 mV
Pulse Gen Model: 2240
Pulse Gen Serial Number: 7588973

## 2021-05-26 NOTE — Progress Notes (Signed)
Remote pacemaker transmission.   

## 2021-06-02 ENCOUNTER — Other Ambulatory Visit: Payer: Self-pay | Admitting: Cardiovascular Disease

## 2021-08-06 ENCOUNTER — Other Ambulatory Visit: Payer: Self-pay | Admitting: Family

## 2021-08-06 ENCOUNTER — Other Ambulatory Visit: Payer: Self-pay | Admitting: General Practice

## 2021-08-06 DIAGNOSIS — Z1231 Encounter for screening mammogram for malignant neoplasm of breast: Secondary | ICD-10-CM

## 2021-08-06 DIAGNOSIS — Z13820 Encounter for screening for osteoporosis: Secondary | ICD-10-CM

## 2021-08-09 ENCOUNTER — Ambulatory Visit (INDEPENDENT_AMBULATORY_CARE_PROVIDER_SITE_OTHER): Payer: Medicare Other

## 2021-08-09 DIAGNOSIS — I442 Atrioventricular block, complete: Secondary | ICD-10-CM

## 2021-08-10 LAB — CUP PACEART REMOTE DEVICE CHECK
Battery Remaining Longevity: 20 mo
Battery Remaining Percentage: 16 %
Battery Voltage: 2.89 V
Brady Statistic AP VP Percent: 37 %
Brady Statistic AP VS Percent: 1 %
Brady Statistic AS VP Percent: 63 %
Brady Statistic AS VS Percent: 1 %
Brady Statistic RA Percent Paced: 37 %
Brady Statistic RV Percent Paced: 99 %
Date Time Interrogation Session: 20230807020014
Implantable Lead Implant Date: 20150202
Implantable Lead Implant Date: 20150202
Implantable Lead Location: 753859
Implantable Lead Location: 753860
Implantable Pulse Generator Implant Date: 20150202
Lead Channel Impedance Value: 460 Ohm
Lead Channel Impedance Value: 480 Ohm
Lead Channel Pacing Threshold Amplitude: 0.75 V
Lead Channel Pacing Threshold Amplitude: 0.75 V
Lead Channel Pacing Threshold Pulse Width: 0.4 ms
Lead Channel Pacing Threshold Pulse Width: 0.4 ms
Lead Channel Sensing Intrinsic Amplitude: 4.4 mV
Lead Channel Sensing Intrinsic Amplitude: 4.8 mV
Lead Channel Setting Pacing Amplitude: 1 V
Lead Channel Setting Pacing Amplitude: 2 V
Lead Channel Setting Pacing Pulse Width: 0.4 ms
Lead Channel Setting Sensing Sensitivity: 2 mV
Pulse Gen Model: 2240
Pulse Gen Serial Number: 7588973

## 2021-09-14 ENCOUNTER — Ambulatory Visit (INDEPENDENT_AMBULATORY_CARE_PROVIDER_SITE_OTHER): Payer: Medicare Other | Admitting: Pulmonary Disease

## 2021-09-14 DIAGNOSIS — J4530 Mild persistent asthma, uncomplicated: Secondary | ICD-10-CM

## 2021-09-14 LAB — PULMONARY FUNCTION TEST
DL/VA % pred: 109 %
DL/VA: 4.49 ml/min/mmHg/L
DLCO cor % pred: 79 %
DLCO cor: 15.97 ml/min/mmHg
DLCO unc % pred: 79 %
DLCO unc: 15.97 ml/min/mmHg
FEF 25-75 Post: 2.62 L/sec
FEF 25-75 Pre: 2.39 L/sec
FEF2575-%Change-Post: 9 %
FEF2575-%Pred-Post: 140 %
FEF2575-%Pred-Pre: 128 %
FEV1-%Change-Post: 3 %
FEV1-%Pred-Post: 85 %
FEV1-%Pred-Pre: 83 %
FEV1-Post: 1.98 L
FEV1-Pre: 1.91 L
FEV1FVC-%Change-Post: 2 %
FEV1FVC-%Pred-Pre: 110 %
FEV6-%Change-Post: 1 %
FEV6-%Pred-Post: 79 %
FEV6-%Pred-Pre: 78 %
FEV6-Post: 2.31 L
FEV6-Pre: 2.27 L
FEV6FVC-%Change-Post: 0 %
FEV6FVC-%Pred-Post: 104 %
FEV6FVC-%Pred-Pre: 103 %
FVC-%Change-Post: 0 %
FVC-%Pred-Post: 75 %
FVC-%Pred-Pre: 75 %
FVC-Post: 2.31 L
FVC-Pre: 2.3 L
Post FEV1/FVC ratio: 86 %
Post FEV6/FVC ratio: 100 %
Pre FEV1/FVC ratio: 83 %
Pre FEV6/FVC Ratio: 99 %
RV % pred: 98 %
RV: 2.25 L
TLC % pred: 87 %
TLC: 4.57 L

## 2021-09-14 NOTE — Progress Notes (Signed)
Remote pacemaker transmission.   

## 2021-09-14 NOTE — Progress Notes (Signed)
PFT done today. 

## 2021-09-27 ENCOUNTER — Ambulatory Visit (INDEPENDENT_AMBULATORY_CARE_PROVIDER_SITE_OTHER): Payer: Medicare Other | Admitting: Pulmonary Disease

## 2021-09-27 ENCOUNTER — Encounter: Payer: Self-pay | Admitting: Pulmonary Disease

## 2021-09-27 VITALS — BP 128/82 | HR 61 | Ht 64.5 in | Wt 176.0 lb

## 2021-09-27 DIAGNOSIS — J4530 Mild persistent asthma, uncomplicated: Secondary | ICD-10-CM

## 2021-09-27 DIAGNOSIS — J302 Other seasonal allergic rhinitis: Secondary | ICD-10-CM

## 2021-09-27 DIAGNOSIS — R0982 Postnasal drip: Secondary | ICD-10-CM | POA: Diagnosis not present

## 2021-09-27 MED ORDER — ALBUTEROL SULFATE HFA 108 (90 BASE) MCG/ACT IN AERS: 1.0000 | INHALATION_SPRAY | RESPIRATORY_TRACT | 6 refills | Status: DC | PRN

## 2021-09-27 MED ORDER — FLUTICASONE-SALMETEROL 100-50 MCG/ACT IN AEPB: 1.0000 | INHALATION_SPRAY | Freq: Two times a day (BID) | RESPIRATORY_TRACT | 11 refills | Status: DC

## 2021-09-27 MED ORDER — FEXOFENADINE HCL 180 MG PO TABS: 180.0000 mg | ORAL_TABLET | Freq: Every day | ORAL | 11 refills | Status: DC

## 2021-09-27 MED ORDER — IPRATROPIUM BROMIDE 0.03 % NA SOLN: 2.0000 | Freq: Two times a day (BID) | NASAL | 12 refills | Status: DC

## 2021-09-27 MED ORDER — FLUTICASONE PROPIONATE 50 MCG/ACT NA SUSP: 1.0000 | Freq: Every day | NASAL | 11 refills | Status: AC

## 2021-09-27 MED ORDER — BUDESONIDE-FORMOTEROL FUMARATE 80-4.5 MCG/ACT IN AERO: 2.0000 | INHALATION_SPRAY | Freq: Two times a day (BID) | RESPIRATORY_TRACT | 12 refills | Status: DC

## 2021-09-27 MED ORDER — AEROCHAMBER MV MISC: 0 refills | Status: AC

## 2021-09-27 MED ORDER — MONTELUKAST SODIUM 10 MG PO TABS: ORAL_TABLET | ORAL | 11 refills | Status: DC

## 2021-09-27 NOTE — Progress Notes (Signed)
Synopsis: Referred in March 2023 for Asthma by Vanita Panda, MD  Subjective:   PATIENT ID: Jessica Carpenter DOB: June 08, 1950, MRN: [id number]  HPI  Chief Complaint  Patient presents with   Follow-up    F/U after PFT. States she has been having a lot of congestion for the past few weeks.    Jessica Carpenter is a 71 year old woman, never smoker with GERD, hypertension, complete heart block s/p pacemaker and DMII who returns to pulmonary clinic for asthma.   PFTs 09/14/21 are within normal limits.  She has been using budesonide 85mg 2 puffs twice daily and as needed albuterol. She is using albuterol about once daily. She is on Singulair daily. She is using flonase/atrovent nasal sprays.   She has increased sinus congestion and chest congestion. She is having harder time coughing up chest congestion. She hasn't had bronchitis in the past 2 years.   Initial OV 03/18/21 She was last seen in clinic in 2016 by Dr. BLamonte Sakai PFTs showed mild obstruction based on flow volume loop. She was being treated with advair 250-530m 1 puff twice daily at that time. She was also being treated for GERD and allergies at that time as aggravating factors of her asthma.   She was using symbicort as needed before. She reports it was making her chest and throat sore. She reports episode of respiratory distress where she lost consciousness after coughing and losing her breath. She reports coughing and passing out. PCP changed her to pulmicort inhaler 902mwhich does not hurt her chest or throat.   She experiences occasoinal dyspnea, cough and wheezing. She notices the dyspnea and wheezing at night sometimes that is relieved by albuterol.   She does report spring allergies.   She is a never smoker. She does report second hand smoke from her father.   Past Medical History:  Diagnosis Date   Allergic rhinitis    Arthritis    "left leg" (02/04/2013)   Asthma    [place]itis [place]  "usually  get it q yr" (02/04/2013)   GERD (gastroesophageal reflux disease)    Gout    "used to; haven't had it lately" (02/04/2013)   Hypertension    Pacemaker    PONV (postoperative nausea and vomiting)    Symptomatic bradycardia    s/p STJ dual chamber pacemaker by Dr TayLovena Le2015   [place]    Family History  Problem Relation Age of Onset   Emphysema Father    Breast cancer Mother      Social History   Socioeconomic History   Marital status: Divorced    Spouse name: Not on file   Number of children: Not on file   Years of education: Not on file   Highest education level: Not on file  Occupational History   Occupation: disabled   Tobacco Use   Smoking status: Never   Smokeless tobacco: Never  Vaping Use   Vaping Use: Never used  Substance and Sexual Activity   Alcohol use: Not Currently    Comment: rarely   Drug use: No   Sexual activity: Not on file  Other Topics Concern   Not on file  Social History Narrative   Not on file   Social Determinants of Health   Financial Resource Strain: Not on file  Food Insecurity: Not on file  Transportation Needs: Not on file  Physical Activity: Not on file  Stress: Not on  file  Social Connections: Not on file  Intimate Partner Violence: Not on file     Allergies  Allergen Reactions   Moxifloxacin Hives, Shortness Of Breath and Other (See Comments)    Caused syncope and throat tightness   Penicillins Hives, Shortness Of Breath and Other (See Comments)    Caused syncope and throat tightness Has patient had a PCN reaction causing immediate rash, facial/tongue/throat swelling, SOB or lightheadedness with hypotension: Yes Has patient had a PCN reaction causing severe rash involving mucus membranes or skin necrosis: No Has patient had a PCN reaction that required hospitalization: hospital visit but not overnight Has patient had a PCN reaction occurring within the last 10 years: No If all of the above answers  are "NO", then may proceed with Cephalosporin   Arimidex [Anastrozole]      Outpatient Medications Prior to Visit  Medication Sig Dispense Refill   amLODipine (NORVASC) 10 MG tablet Take 10 mg by mouth daily.     aspirin EC 81 MG tablet Take 81 mg by mouth daily.     COVID-19 mRNA bivalent vaccine, Moderna, (MODERNA COVID-19 BIVAL BOOSTER) 50 MCG/0.5ML injection Inject into the muscle. 0.5 mL 0   [employer] PURELAX 17 GM/SCOOP powder Take by mouth daily.     fluconazole (DIFLUCAN) 150 MG tablet Take 1 tablet (150 mg total) by mouth daily. 2 tablet 0   hydrochlorothiazide (HYDRODIURIL) 25 MG tablet Take 25 mg by mouth daily.      JARDIANCE 25 MG TABS tablet Take 25 mg by mouth every morning.     losartan (COZAAR) 100 MG tablet Take 100 mg by mouth daily.     lovastatin (MEVACOR) 10 MG tablet Take 1 tablet by mouth daily.     metformin (FORTAMET) 1000 MG (OSM) 24 hr tablet Take 1,000 mg by mouth 2 (two) times daily.     metFORMIN (GLUCOPHAGE) 1000 MG tablet SMARTSIG:1 Tablet(s) By Mouth Morning-Evening     nystatin (MYCOSTATIN) 100000 UNIT/ML suspension SMARTSIG:4 Milliliter(s) By Mouth 4 Times Daily 60 mL 6   nystatin-triamcinolone (MYCOLOG II) cream Apply to affected area daily 15 g 0   omeprazole (PRILOSEC) 20 MG capsule TAKE 1 CAPSULE (20 MG TOTAL) BY MOUTH 2 (TWO) TIMES DAILY BEFORE A MEAL. 180 capsule 3   Polyethyl Glycol-Propyl Glycol (SYSTANE OP) Place 1 drop into both eyes daily as needed (dry eyes).     albuterol (PROVENTIL HFA;VENTOLIN HFA) 108 (90 Base) MCG/ACT inhaler Inhale 1-2 puffs into the lungs every 4 (four) hours as needed for wheezing or shortness of breath. 1 Inhaler 1   Budesonide 90 MCG/ACT inhaler Inhale 2 puffs into the lungs 2 (two) times daily.     fluticasone (FLONASE) 50 MCG/ACT nasal spray Place 1 spray into both nostrils daily. 16 g 0   ipratropium (ATROVENT) 0.03 % nasal spray Place 2 sprays into both nostrils every 12 (twelve) hours. 30 mL 12   montelukast  (SINGULAIR) 10 MG tablet SMARTSIG:1 Tablet(s) By Mouth Every Evening     No facility-administered medications prior to visit.   Review of Systems  Constitutional:  Negative for chills, fever, malaise/fatigue and weight loss.  HENT:  Positive for congestion. Negative for sinus pain and sore throat.   Eyes: Negative.   Respiratory:  Positive for cough, shortness of breath and wheezing. Negative for hemoptysis and sputum production.   Cardiovascular:  Negative for chest pain, palpitations, orthopnea, claudication and leg swelling.  Gastrointestinal:  Negative for abdominal pain, heartburn, nausea and vomiting.  Genitourinary:  Negative.   Musculoskeletal:  Negative for joint pain and myalgias.  Skin:  Negative for rash.  Neurological:  Negative for weakness.  Endo/Heme/Allergies: Negative.   Psychiatric/Behavioral: Negative.      Objective:   Vitals:   09/27/21 0910  BP: 128/82  Pulse: 61  SpO2: 99%  Weight: 176 lb (79.8 kg)  Height: 5' 4.5" (1.638 m)    Physical Exam Constitutional:      General: She is not in acute distress.    Appearance: She is not ill-appearing.  HENT:     Head: Normocephalic and atraumatic.  Eyes:     General: No scleral icterus. Cardiovascular:     Rate and Rhythm: Normal rate and regular rhythm.     Pulses: Normal pulses.     Heart sounds: Normal heart sounds. No murmur heard. Pulmonary:     Effort: Pulmonary effort is normal.     Breath sounds: Normal breath sounds. No wheezing, rhonchi or rales.  Musculoskeletal:     Right lower leg: No edema.     Left lower leg: No edema.  Skin:    General: Skin is warm and dry.  Neurological:     General: No focal deficit present.     Mental Status: She is alert.  Psychiatric:        Mood and Affect: Mood normal.        Behavior: Behavior normal.        Thought Content: Thought content normal.        Judgment: Judgment normal.    CBC    Component Value Date/Time   WBC 7.0 09/26/2015 0930   RBC  5.28 (H) 09/26/2015 0930   HGB 14.7 09/26/2015 0930   HCT 44.8 09/26/2015 0930   PLT 260 09/26/2015 0930   MCV 84.8 09/26/2015 0930   MCH 27.8 09/26/2015 0930   MCHC 32.8 09/26/2015 0930   RDW 14.0 09/26/2015 0930   LYMPHSABS 2.9 09/26/2015 0930   MONOABS 0.6 09/26/2015 0930   EOSABS 0.1 09/26/2015 0930   BASOSABS 0.0 09/26/2015 0930      Latest Ref Rng & Units 09/26/2015    9:30 AM 08/22/2015    7:24 AM 02/04/2013   10:25 AM  BMP  Glucose 65 - 99 mg/dL 132  116    BUN 6 - 20 mg/dL 18  14    Creatinine 0.44 - 1.00 mg/dL 1.20  1.01    Sodium 135 - 145 mmol/L 144  139    Potassium 3.5 - 5.1 mmol/L 4.0  3.8  4.0   Chloride 101 - 111 mmol/L 105  106    CO2 22 - 32 mmol/L 23  24    Calcium 8.9 - 10.3 mg/dL 9.9  9.5     Chest imaging: CXR 11/23/2017 LEFT-sided transvenous pacemaker with leads to the RIGHT atrium and RIGHT ventricle. The heart size is normal. There are no focal consolidations. No pleural effusions or edema.  PFT:    Latest Ref Rng & Units 09/14/2021   10:14 AM 07/30/2014    3:34 PM  PFT Results  FVC-Pre L 2.30  2.19   FVC-Predicted Pre % 75  83   FVC-Post L 2.31  2.24   FVC-Predicted Post % 75  85   Pre FEV1/FVC % % 83  83   Post FEV1/FCV % % 86  85   FEV1-Pre L 1.91  1.81   FEV1-Predicted Pre % 83  88   FEV1-Post L 1.98  1.89   DLCO  uncorrected ml/min/mmHg 15.97  10.97   DLCO UNC% % 79  42   DLCO corrected ml/min/mmHg 15.97    DLCO COR %Predicted % 79    DLVA Predicted % 109  100   TLC L 4.57    TLC % Predicted % 87    RV % Predicted % 98     Labs:  Path:  Echo 2015: LV EF 55-60%. Grade I diastolic dysfunction.   Heart Catheterization:  Assessment & Plan:   Mild persistent asthma without complication - Plan: budesonide-formoterol (SYMBICORT) 80-4.5 MCG/ACT inhaler, albuterol (VENTOLIN HFA) 108 (90 Base) MCG/ACT inhaler, montelukast (SINGULAIR) 10 MG tablet, DISCONTINUED: fluticasone-salmeterol (ADVAIR DISKUS) 100-50 MCG/ACT AEPB  Post-nasal  drainage - Plan: fluticasone (FLONASE) 50 MCG/ACT nasal spray, ipratropium (ATROVENT) 0.03 % nasal spray  Seasonal allergies - Plan: fexofenadine (ALLEGRA) 180 MG tablet  Discussion: Jessica Carpenter is a 71 year old woman, never smoker with GERD, hypertension, complete heart block s/p pacemaker and DMII who returns to pulmonary clinic for asthma.   She appears to have mild persistent asthma based on her clinical symptoms.  We will transition her from ICS to ICS/LABA therapy today with symbicort 80-4.56mg 2 puffs twice daily with spacer. She is to continue albuterol inhaler as needed.  She is to continue montelukast 10 mg daily for allergies/asthma.  Recommend she start fexofenadine for seasonal allergies.   She is to continue fluticasone 1 to 2 sprays per nostril daily for sinus congestion and postnasal drainage along with ipratropium nasal spray, 2 sprays per nostril twice daily as needed.    Follow up in 1 year.  JFreda Jackson MD [facility] Office: [phone]  Current Outpatient Medications:    amLODipine (NORVASC) 10 MG tablet, Take 10 mg by mouth daily., Disp: , Rfl:    aspirin EC 81 MG tablet, Take 81 mg by mouth daily., Disp: , Rfl:    budesonide-formoterol (SYMBICORT) 80-4.5 MCG/ACT inhaler, Inhale 2 puffs into the lungs in the morning and at bedtime., Disp: 1 each, Rfl: 12   COVID-19 mRNA bivalent vaccine, Moderna, (MODERNA COVID-19 BIVAL BOOSTER) 50 MCG/0.5ML injection, Inject into the muscle., Disp: 0.5 mL, Rfl: 0   [employer] PURELAX 17 GM/SCOOP powder, Take by mouth daily., Disp: , Rfl:    fexofenadine (ALLEGRA) 180 MG tablet, Take 1 tablet (180 mg total) by mouth daily., Disp: 30 tablet, Rfl: 11   fluconazole (DIFLUCAN) 150 MG tablet, Take 1 tablet (150 mg total) by mouth daily., Disp: 2 tablet, Rfl: 0   hydrochlorothiazide (HYDRODIURIL) 25 MG tablet, Take 25 mg by mouth daily. , Disp: , Rfl:    JARDIANCE 25 MG TABS tablet, Take 25 mg by mouth every  morning., Disp: , Rfl:    losartan (COZAAR) 100 MG tablet, Take 100 mg by mouth daily., Disp: , Rfl:    lovastatin (MEVACOR) 10 MG tablet, Take 1 tablet by mouth daily., Disp: , Rfl:    metformin (FORTAMET) 1000 MG (OSM) 24 hr tablet, Take 1,000 mg by mouth 2 (two) times daily., Disp: , Rfl:    metFORMIN (GLUCOPHAGE) 1000 MG tablet, SMARTSIG:1 Tablet(s) By Mouth Morning-Evening, Disp: , Rfl:    nystatin (MYCOSTATIN) 100000 UNIT/ML suspension, SMARTSIG:4 Milliliter(s) By Mouth 4 Times Daily, Disp: 60 mL, Rfl: 6   nystatin-triamcinolone (MYCOLOG II) cream, Apply to affected area daily, Disp: 15 g, Rfl: 0   omeprazole (PRILOSEC) 20 MG capsule, TAKE 1 CAPSULE (20 MG TOTAL) BY MOUTH 2 (TWO) TIMES DAILY BEFORE A MEAL., Disp: 180 capsule,  Rfl: 3   Polyethyl Glycol-Propyl Glycol (SYSTANE OP), Place 1 drop into both eyes daily as needed (dry eyes)., Disp: , Rfl:    Spacer/Aero-Holding Chambers (AEROCHAMBER MV) inhaler, Use as instructed, Disp: 1 each, Rfl: 0   albuterol (VENTOLIN HFA) 108 (90 Base) MCG/ACT inhaler, Inhale 1-2 puffs into the lungs every 4 (four) hours as needed for wheezing or shortness of breath., Disp: 1 each, Rfl: 6   fluticasone (FLONASE) 50 MCG/ACT nasal spray, Place 1 spray into both nostrils daily., Disp: 16 g, Rfl: 11   ipratropium (ATROVENT) 0.03 % nasal spray, Place 2 sprays into both nostrils every 12 (twelve) hours., Disp: 30 mL, Rfl: 12   montelukast (SINGULAIR) 10 MG tablet, SMARTSIG:1 Tablet(s) By Mouth Every Evening, Disp: 30 tablet, Rfl: 11

## 2021-09-27 NOTE — Patient Instructions (Signed)
Start symbicort 2 puffs twice daily - rinse mouth out after each use  Use albuterol inhaler as needed  Stop pulmicort inhaler while using symbicort  Continue montelukast '10mg'$  daily for allergies/asthma  Continue fluticasone nasal spray, 1-2 sprays per nostril daily   Use ipratropium nasal spray as need 1-2 times per day, 2 spray per nostril each time.    Start allegra daily during the spring and fall seasons for allergies.   Your breathing tests are within normal limits  Follow up in 1 year

## 2021-09-28 ENCOUNTER — Encounter: Payer: Self-pay | Admitting: Pulmonary Disease

## 2021-11-08 ENCOUNTER — Ambulatory Visit (INDEPENDENT_AMBULATORY_CARE_PROVIDER_SITE_OTHER): Payer: Medicare Other

## 2021-11-08 DIAGNOSIS — I442 Atrioventricular block, complete: Secondary | ICD-10-CM | POA: Diagnosis not present

## 2021-11-08 DIAGNOSIS — Z950 Presence of cardiac pacemaker: Secondary | ICD-10-CM

## 2021-11-10 ENCOUNTER — Encounter: Payer: Medicare Other | Admitting: Internal Medicine

## 2021-11-10 ENCOUNTER — Ambulatory Visit (AMBULATORY_SURGERY_CENTER): Payer: Medicare Other

## 2021-11-10 VITALS — Ht 64.0 in | Wt 174.0 lb

## 2021-11-10 DIAGNOSIS — Z1211 Encounter for screening for malignant neoplasm of colon: Secondary | ICD-10-CM

## 2021-11-10 LAB — CUP PACEART REMOTE DEVICE CHECK
Battery Remaining Longevity: 17 mo
Battery Remaining Percentage: 14 %
Battery Voltage: 2.87 V
Brady Statistic AP VP Percent: 40 %
Brady Statistic AP VS Percent: 1 %
Brady Statistic AS VP Percent: 60 %
Brady Statistic AS VS Percent: 1 %
Brady Statistic RA Percent Paced: 40 %
Brady Statistic RV Percent Paced: 99 %
Date Time Interrogation Session: 20231106010013
Implantable Lead Connection Status: 753985
Implantable Lead Connection Status: 753985
Implantable Lead Implant Date: 20150202
Implantable Lead Implant Date: 20150202
Implantable Lead Location: 753859
Implantable Lead Location: 753860
Implantable Pulse Generator Implant Date: 20150202
Lead Channel Impedance Value: 490 Ohm
Lead Channel Impedance Value: 490 Ohm
Lead Channel Pacing Threshold Amplitude: 0.75 V
Lead Channel Pacing Threshold Amplitude: 0.75 V
Lead Channel Pacing Threshold Pulse Width: 0.4 ms
Lead Channel Pacing Threshold Pulse Width: 0.4 ms
Lead Channel Sensing Intrinsic Amplitude: 1.8 mV
Lead Channel Sensing Intrinsic Amplitude: 4 mV
Lead Channel Setting Pacing Amplitude: 1 V
Lead Channel Setting Pacing Amplitude: 2 V
Lead Channel Setting Pacing Pulse Width: 0.4 ms
Lead Channel Setting Sensing Sensitivity: 2 mV
Pulse Gen Model: 2240
Pulse Gen Serial Number: 7588973

## 2021-11-10 MED ORDER — PEG 3350-KCL-NA BICARB-NACL 420 G PO SOLR: 4000.0000 mL | Freq: Once | ORAL | 0 refills | Status: AC

## 2021-11-10 NOTE — Progress Notes (Signed)
No egg or soy allergy known to patient  No issues known to pt with past sedation with any surgeries or procedures Patient denies ever being told they had issues or difficulty with intubation  No FH of Malignant Hyperthermia Pt is not on diet pills Pt is not on  home 02  Pt is not on blood thinners  Pt denies issues with constipation  No A fib or A flutter Have any cardiac testing pending--no Pt instructed to use [email or web address] or GoodRx for a price reduction on prep   

## 2021-12-08 ENCOUNTER — Encounter: Payer: Self-pay | Admitting: Internal Medicine

## 2021-12-08 ENCOUNTER — Ambulatory Visit (AMBULATORY_SURGERY_CENTER): Payer: Medicare Other | Admitting: Internal Medicine

## 2021-12-08 VITALS — BP 120/66 | HR 60 | Temp 96.9°F | Resp 17 | Ht 64.0 in | Wt 168.0 lb

## 2021-12-08 DIAGNOSIS — D123 Benign neoplasm of transverse colon: Secondary | ICD-10-CM

## 2021-12-08 DIAGNOSIS — D128 Benign neoplasm of rectum: Secondary | ICD-10-CM

## 2021-12-08 DIAGNOSIS — D122 Benign neoplasm of ascending colon: Secondary | ICD-10-CM | POA: Diagnosis not present

## 2021-12-08 DIAGNOSIS — Z1211 Encounter for screening for malignant neoplasm of colon: Secondary | ICD-10-CM

## 2021-12-08 DIAGNOSIS — K621 Rectal polyp: Secondary | ICD-10-CM | POA: Diagnosis not present

## 2021-12-08 MED ORDER — SODIUM CHLORIDE 0.9 % IV SOLN: 500.0000 mL | Freq: Once | INTRAVENOUS | Status: DC

## 2021-12-08 NOTE — Progress Notes (Signed)
GASTROENTEROLOGY PROCEDURE H&P NOTE   Primary Care Physician: [facility], [facility]    Reason for Procedure:   Colon cancer screening  Plan:    Colonoscopy  Patient is appropriate for endoscopic procedure(s) in the ambulatory ([place]) setting.  The nature of the procedure, as well as the risks, benefits, and alternatives were carefully and thoroughly reviewed with the patient. Ample time for discussion and questions allowed. The patient understood, was satisfied, and agreed to proceed.     HPI: Jessica Carpenter is a 71 y.o. female who presents for colonoscopy for colon cancer screening. Denies blood in stools, changes in bowel habits, or unintentional weight loss. Denies family history of colon cancer. Last colonoscopy was about 20 years ago.   Past Medical History:  Diagnosis Date   Allergic rhinitis    Arthritis    "left leg" (02/04/2013)   Asthma    CHF (congestive heart failure) ([place])    Chronic bronchitis ([place])    "usually get it q yr" (02/04/2013)   GERD (gastroesophageal reflux disease)    Glaucoma    Gout    "used to; haven't had it lately" (02/04/2013)   Hypertension    Pacemaker    PONV (postoperative nausea and vomiting)    Symptomatic bradycardia    s/p STJ dual chamber pacemaker by Dr Lovena Le 02/2013   Type II diabetes mellitus ([place])     Past Surgical History:  Procedure Laterality Date   ABDOMINAL HYSTERECTOMY  ?1980's   BREAST BIOPSY Right    "benign"   BREAST LUMPECTOMY Right    "benign"   DILATION AND CURETTAGE OF UTERUS     GANGLION CYST EXCISION Right 2003   Ganglion of wrist, volar and distal radius   PACEMAKER INSERTION  02/04/2013   STJ [employer] dual chamber pacemaker implanted by Dr Lovena Le for symptomatic bradycardia   PERMANENT PACEMAKER INSERTION N/A 02/04/2013   Procedure: PERMANENT PACEMAKER INSERTION;  Surgeon: Evans Lance, MD;  [facility] C[facility];  Service: Cardiovascular;  Laterality: N/A;   TUBAL LIGATION      Prior  to Admission medications   Medication Sig Start Date End Date Taking? Authorizing Provider  albuterol (VENTOLIN HFA) 108 (90 Base) MCG/ACT inhaler Inhale 1-2 puffs into the lungs every 4 (four) hours as needed for wheezing or shortness of breath. 09/27/21  Yes Freddi Starr, MD  amLODipine (NORVASC) 10 MG tablet Take 10 mg by mouth daily.   Yes [provider]  aspirin EC 81 MG tablet Take 81 mg by mouth daily.   Yes [provider]  budesonide-formoterol (SYMBICORT) 80-4.5 MCG/ACT inhaler Inhale 2 puffs into the lungs in the morning and at bedtime. 09/27/21  Yes Freddi Starr, MD  fexofenadine (ALLEGRA) 180 MG tablet Take 1 tablet (180 mg total) by mouth daily. 09/27/21  Yes Freddi Starr, MD  hydrochlorothiazide (HYDRODIURIL) 25 MG tablet Take 25 mg by mouth daily.    Yes [provider]  losartan (COZAAR) 100 MG tablet Take 100 mg by mouth daily.   Yes [provider]  lovastatin (MEVACOR) 10 MG tablet Take 1 tablet by mouth daily. 03/15/20  Yes [provider]  metFORMIN (GLUCOPHAGE) 1000 MG tablet SMARTSIG:1 Tablet(s) By Mouth Morning-Evening 12/23/20  Yes [provider]  omeprazole (PRILOSEC) 20 MG capsule TAKE 1 CAPSULE (20 MG TOTAL) BY MOUTH 2 (TWO) TIMES DAILY BEFORE A MEAL. 06/02/21  Yes Evans Lance, MD  Polyethyl Glycol-Propyl Glycol (SYSTANE OP) Place 1 drop  into both eyes daily as needed (dry eyes).   Yes [provider]  COVID-19 mRNA bivalent vaccine, Moderna, (MODERNA COVID-19 BIVAL BOOSTER) 50 MCG/0.5ML injection Inject into the muscle. Patient not taking: Reported on 11/10/2021 11/04/20   Carlyle Basques, MD  [employer]S PURELAX 17 GM/SCOOP powder Take by mouth daily. Patient not taking: Reported on 11/10/2021 12/02/20   [provider]  fluticasone (FLONASE) 50 MCG/ACT nasal spray Place 1 spray into both nostrils daily. 09/27/21   Freddi Starr, MD  ipratropium (ATROVENT) 0.03 % nasal spray Place 2  sprays into both nostrils every 12 (twelve) hours. 09/27/21   Freddi Starr, MD  JARDIANCE 25 MG TABS tablet Take 25 mg by mouth every morning. Patient not taking: Reported on 11/10/2021 11/05/20   [provider]  metformin (FORTAMET) 1000 MG (OSM) 24 hr tablet Take 1,000 mg by mouth 2 (two) times daily. 07/30/15   [provider]  montelukast (SINGULAIR) 10 MG tablet SMARTSIG:1 Tablet(s) By Mouth Every Evening Patient not taking: Reported on 11/10/2021 09/27/21   Freddi Starr, MD  nystatin (MYCOSTATIN) 100000 UNIT/ML suspension SMARTSIG:4 Milliliter(s) By Mouth 4 Times Daily 03/18/21   Freddi Starr, MD  nystatin-triamcinolone [facility] II) cream Apply to affected area daily 11/06/19   Orvan July, NP  Spacer/Aero-Holding Chambers (AEROCHAMBER MV) inhaler Use as instructed 09/27/21   Freddi Starr, MD    Current Outpatient Medications  Medication Sig Dispense Refill   albuterol (VENTOLIN HFA) 108 (90 Base) MCG/ACT inhaler Inhale 1-2 puffs into the lungs every 4 (four) hours as needed for wheezing or shortness of breath. 1 each 6   amLODipine (NORVASC) 10 MG tablet Take 10 mg by mouth daily.     aspirin EC 81 MG tablet Take 81 mg by mouth daily.     budesonide-formoterol (SYMBICORT) 80-4.5 MCG/ACT inhaler Inhale 2 puffs into the lungs in the morning and at bedtime. 1 each 12   fexofenadine (ALLEGRA) 180 MG tablet Take 1 tablet (180 mg total) by mouth daily. 30 tablet 11   hydrochlorothiazide (HYDRODIURIL) 25 MG tablet Take 25 mg by mouth daily.      losartan (COZAAR) 100 MG tablet Take 100 mg by mouth daily.     lovastatin (MEVACOR) 10 MG tablet Take 1 tablet by mouth daily.     metFORMIN (GLUCOPHAGE) 1000 MG tablet SMARTSIG:1 Tablet(s) By Mouth Morning-Evening     omeprazole (PRILOSEC) 20 MG capsule TAKE 1 CAPSULE (20 MG TOTAL) BY MOUTH 2 (TWO) TIMES DAILY BEFORE A MEAL. 180 capsule 3   Polyethyl Glycol-Propyl Glycol (SYSTANE OP) Place 1 drop into both eyes  daily as needed (dry eyes).     COVID-19 mRNA bivalent vaccine, Moderna, (MODERNA COVID-19 BIVAL BOOSTER) 50 MCG/0.5ML injection Inject into the muscle. (Patient not taking: Reported on 11/10/2021) 0.5 mL 0   CVS PURELAX 17 GM/SCOOP powder Take by mouth daily. (Patient not taking: Reported on 11/10/2021)     fluticasone (FLONASE) 50 MCG/ACT nasal spray Place 1 spray into both nostrils daily. 16 g 11   ipratropium (ATROVENT) 0.03 % nasal spray Place 2 sprays into both nostrils every 12 (twelve) hours. 30 mL 12   JARDIANCE 25 MG TABS tablet Take 25 mg by mouth every morning. (Patient not taking: Reported on 11/10/2021)     metformin (FORTAMET) 1000 MG (OSM) 24 hr tablet Take 1,000 mg by mouth 2 (two) times daily.     montelukast (SINGULAIR) 10 MG tablet SMARTSIG:1 Tablet(s) By Mouth Every Evening (Patient not  taking: Reported on 11/10/2021) 30 tablet 11   nystatin (MYCOSTATIN) 100000 UNIT/ML suspension SMARTSIG:4 Milliliter(s) By Mouth 4 Times Daily 60 mL 6   nystatin-triamcinolone (MYCOLOG II) cream Apply to affected area daily 15 g 0   Spacer/Aero-Holding Chambers (AEROCHAMBER MV) inhaler Use as instructed 1 each 0   Current Facility-Administered Medications  Medication Dose Route Frequency Provider Last Rate Last Admin   0.9 %  sodium chloride infusion  500 mL Intravenous Once Sharyn Creamer, MD        Allergies as of 12/08/2021 - Review Complete 12/08/2021  Allergen Reaction Noted   Moxifloxacin Hives, Shortness Of Breath, and Other (See Comments) 02/10/2009   Penicillins Hives, Shortness Of Breath, and Other (See Comments) 09/15/2006   Arimidex [anastrozole]  01/28/2018    Family History  Problem Relation Age of Onset   Breast cancer Mother    Emphysema Father    Colon polyps Neg Hx    Colon cancer Neg Hx    Esophageal cancer Neg Hx    Rectal cancer Neg Hx    Stomach cancer Neg Hx     Social History   Socioeconomic History   Marital status: Divorced    Spouse name: Not on file    Number of children: Not on file   Years of education: Not on file   Highest education level: Not on file  Occupational History   Occupation: disabled   Tobacco Use   Smoking status: Never   Smokeless tobacco: Never  Vaping Use   Vaping Use: Never used  Substance and Sexual Activity   Alcohol use: Not Currently    Comment: rarely   Drug use: No   Sexual activity: Not on file  Other Topics Concern   Not on file  Social History Narrative   Not on file   Social Determinants of Health   Financial Resource Strain: Not on file  Food Insecurity: Not on file  Transportation Needs: Not on file  Physical Activity: Not on file  Stress: Not on file  Social Connections: Not on file  Intimate Partner Violence: Not on file    Physical Exam: Vital signs in last 24 hours: BP 117/88   Pulse 63   Temp (!) 96.9 F (36.1 C)   Ht '5\' 4"'$  (1.626 m)   Wt 168 lb (76.2 kg)   SpO2 100%   BMI 28.84 kg/m  GEN: NAD EYE: Sclerae anicteric ENT: MMM CV: Non-tachycardic Pulm: No increased work of breathing GI: Soft, NT/ND NEURO:  Alert & Oriented   Christia Reading, MD [facility]  12/08/2021 9:53 AM

## 2021-12-08 NOTE — Progress Notes (Signed)
Called to room to assist during endoscopic procedure.  Patient ID and intended procedure confirmed with present staff. Received instructions for my participation in the procedure from the performing physician.  

## 2021-12-08 NOTE — Patient Instructions (Signed)
Read all of the handouts given to you by your recovery room nurse.  Resume all of your medications.  YOU HAD AN ENDOSCOPIC PROCEDURE TODAY AT [facility]:   Refer to the procedure report that was given to you for any specific questions about what was found during the examination.  If the procedure report does not answer your questions, please call your gastroenterologist to clarify.  If you requested that your care partner not be given the details of your procedure findings, then the procedure report has been included in a sealed envelope for you to review at your convenience later.  YOU SHOULD EXPECT: Some feelings of bloating in the abdomen. Passage of more gas than usual.  Walking can help get rid of the air that was put into your GI tract during the procedure and reduce the bloating. If you had a lower endoscopy (such as a colonoscopy or flexible sigmoidoscopy) you may notice spotting of blood in your stool or on the toilet paper. If you underwent a bowel prep for your procedure, you may not have a normal bowel movement for a few days.  Please Note:  You might notice some irritation and congestion in your nose or some drainage.  This is from the oxygen used during your procedure.  There is no need for concern and it should clear up in a day or so.  SYMPTOMS TO REPORT IMMEDIATELY:  Following lower endoscopy (colonoscopy or flexible sigmoidoscopy):  Excessive amounts of blood in the stool  Significant tenderness or worsening of abdominal pains  Swelling of the abdomen that is new, acute  Fever of 100F or higher   For urgent or emergent issues, a gastroenterologist can be reached at any hour by calling [phone]. Do not use MyChart messaging for urgent concerns.    DIET:  We do recommend a small meal at first, but then you may proceed to your regular diet.  Drink plenty of fluids but you should avoid alcoholic beverages for 24 hours.  ACTIVITY:  You should plan to take  it easy for the rest of today and you should NOT DRIVE or use heavy machinery until tomorrow (because of the sedation medicines used during the test).    FOLLOW UP: Our staff will call the number listed on your records the next business day following your procedure.  We will call around 7:15- 8:00 am to check on you and address any questions or concerns that you may have regarding the information given to you following your procedure. If we do not reach you, we will leave a message.     If any biopsies were taken you will be contacted by phone or by letter within the next 1-3 weeks.  Please call us at [phone] if you have not heard about the biopsies in 3 weeks.    SIGNATURES/CONFIDENTIALITY: You and/or your care partner have signed paperwork which will be entered into your electronic medical record.  These signatures attest to the fact that that the information above on your After Visit Summary has been reviewed and is understood.  Full responsibility of the confidentiality of this discharge information lies with you and/or your care-partner.

## 2021-12-08 NOTE — Op Note (Signed)
[facility] Patient Name: Jessica Carpenter Procedure Date: 12/08/2021 9:57 AM MRN: [id number] Endoscopist: Adline Mango [place] , , [id number] Age: 71 Referring MD:  Date of Birth: July 16, 1950 Gender: Female Account #: [id number] Procedure:                Colonoscopy Indications:              Screening for colorectal malignant neoplasm Medicines:                Monitored Anesthesia Care Procedure:                Pre-Anesthesia Assessment:                           - Prior to the procedure, a History and Physical                            was performed, and patient medications and                            allergies were reviewed. The patient's tolerance of                            previous anesthesia was also reviewed. The risks                            and benefits of the procedure and the sedation                            options and risks were discussed with the patient.                            All questions were answered, and informed consent                            was obtained. Prior Anticoagulants: The patient has                            taken no anticoagulant or antiplatelet agents. ASA                            Grade Assessment: II - A patient with mild systemic                            disease. After reviewing the risks and benefits,                            the patient was deemed in satisfactory condition to                            undergo the procedure.                           After obtaining informed consent, the colonoscope  was passed under direct vision. Throughout the                            procedure, the patient's blood pressure, pulse, and                            oxygen saturations were monitored continuously. The                            CF [id number] #[id number] was introduced through the anus                            and advanced to the the terminal ileum. The                            colonoscopy  was performed without difficulty. The                            patient tolerated the procedure well. The quality                            of the bowel preparation was good. The terminal                            ileum, ileocecal valve, appendiceal orifice, and                            rectum were photographed. Scope In: 10:00:39 AM Scope Out: 10:20:31 AM Scope Withdrawal Time: 0 hours 15 minutes 16 seconds  Total Procedure Duration: 0 hours 19 minutes 52 seconds  Findings:                 The terminal ileum appeared normal.                           Four sessile polyps were found in the transverse                            colon and ascending colon. The polyps were 3 to 5                            mm in size. These polyps were removed with a cold                            snare. Resection and retrieval were complete.                           Multiple diverticula were found in the sigmoid                            colon.                           A 3 mm polyp was found in the rectum. The polyp was  sessile. The polyp was removed with a cold snare.                            Resection and retrieval were complete.                           Non-bleeding internal hemorrhoids were found during                            retroflexion. Complications:            No immediate complications. Estimated Blood Loss:     Estimated blood loss was minimal. Impression:               - The examined portion of the ileum was normal.                           - Four 3 to 5 mm polyps in the transverse colon and                            in the ascending colon, removed with a cold snare.                            Resected and retrieved.                           - Diverticulosis in the sigmoid colon.                           - One 3 mm polyp in the rectum, removed with a cold                            snare. Resected and retrieved.                           -  Non-bleeding internal hemorrhoids. Recommendation:           - Discharge patient to home (with escort).                           - Await pathology results.                           - The findings and recommendations were discussed                            with the patient. Dr Georgian Co "Lyndee Leo" Lorenso Courier,  12/08/2021 10:24:56 AM

## 2021-12-08 NOTE — Progress Notes (Signed)
Pt's states no medical or surgical changes since previsit or office visit. 

## 2021-12-08 NOTE — Progress Notes (Signed)
Report to PACU, RN, vss, BBS= Clear.  

## 2021-12-09 ENCOUNTER — Telehealth: Payer: Self-pay

## 2021-12-09 NOTE — Telephone Encounter (Signed)
  Follow up Call-     12/08/2021    8:49 AM  Call back number  Post procedure Call Back phone  # [phone]  Permission to leave phone message Yes     Patient questions:  Do you have a fever, pain , or abdominal swelling? No. Pain Score  0 *  Have you tolerated food without any problems? Yes.    Have you been able to return to your normal activities? Yes.    Do you have any questions about your discharge instructions: Diet   No. Medications  No. Follow up visit  No.  Do you have questions or concerns about your Care? No.  Actions: * If pain score is 4 or above: No action needed, pain <4.

## 2021-12-10 ENCOUNTER — Encounter: Payer: Self-pay | Admitting: Internal Medicine

## 2021-12-16 NOTE — Progress Notes (Signed)
Remote pacemaker transmission.   

## 2022-02-07 ENCOUNTER — Ambulatory Visit: Payer: Medicare Other

## 2022-02-07 DIAGNOSIS — I442 Atrioventricular block, complete: Secondary | ICD-10-CM

## 2022-02-08 LAB — CUP PACEART REMOTE DEVICE CHECK
Battery Remaining Longevity: 17 mo
Battery Remaining Percentage: 13 %
Battery Voltage: 2.87 V
Brady Statistic AP VP Percent: 41 %
Brady Statistic AP VS Percent: 1 %
Brady Statistic AS VP Percent: 58 %
Brady Statistic AS VS Percent: 1 %
Brady Statistic RA Percent Paced: 41 %
Brady Statistic RV Percent Paced: 99 %
Date Time Interrogation Session: 20240205020026
Implantable Lead Connection Status: 753985
Implantable Lead Connection Status: 753985
Implantable Lead Implant Date: 20150202
Implantable Lead Implant Date: 20150202
Implantable Lead Location: 753859
Implantable Lead Location: 753860
Implantable Pulse Generator Implant Date: 20150202
Lead Channel Impedance Value: 480 Ohm
Lead Channel Impedance Value: 540 Ohm
Lead Channel Pacing Threshold Amplitude: 0.75 V
Lead Channel Pacing Threshold Amplitude: 0.75 V
Lead Channel Pacing Threshold Pulse Width: 0.4 ms
Lead Channel Pacing Threshold Pulse Width: 0.4 ms
Lead Channel Sensing Intrinsic Amplitude: 3.7 mV
Lead Channel Sensing Intrinsic Amplitude: 4 mV
Lead Channel Setting Pacing Amplitude: 1 V
Lead Channel Setting Pacing Amplitude: 2 V
Lead Channel Setting Pacing Pulse Width: 0.4 ms
Lead Channel Setting Sensing Sensitivity: 2 mV
Pulse Gen Model: 2240
Pulse Gen Serial Number: 7588973

## 2022-02-15 ENCOUNTER — Other Ambulatory Visit: Payer: Self-pay | Admitting: Family

## 2022-02-15 DIAGNOSIS — Z1231 Encounter for screening mammogram for malignant neoplasm of breast: Secondary | ICD-10-CM

## 2022-02-16 ENCOUNTER — Encounter: Payer: Self-pay | Admitting: Family

## 2022-02-16 DIAGNOSIS — Z13820 Encounter for screening for osteoporosis: Secondary | ICD-10-CM

## 2022-03-28 NOTE — Progress Notes (Signed)
Remote pacemaker transmission.   

## 2022-04-28 ENCOUNTER — Ambulatory Visit: Payer: Medicare Other | Attending: Internal Medicine | Admitting: Internal Medicine

## 2022-04-28 ENCOUNTER — Encounter: Payer: Self-pay | Admitting: Internal Medicine

## 2022-04-28 VITALS — BP 124/72 | HR 60 | Ht 64.0 in | Wt 160.8 lb

## 2022-04-28 DIAGNOSIS — I442 Atrioventricular block, complete: Secondary | ICD-10-CM | POA: Diagnosis not present

## 2022-04-28 DIAGNOSIS — Z950 Presence of cardiac pacemaker: Secondary | ICD-10-CM | POA: Insufficient documentation

## 2022-04-28 DIAGNOSIS — I10 Essential (primary) hypertension: Secondary | ICD-10-CM | POA: Insufficient documentation

## 2022-04-28 NOTE — Patient Instructions (Addendum)
Medication Instructions:  Your physician recommends that you continue on your current medications as directed. Please refer to the Current Medication list given to you today.  *If you need a refill on your cardiac medications before your next appointment, please call your pharmacy*  Lab Work: None ordered.  If you have labs (blood work) drawn today and your tests are completely normal, you will receive your results only by: MyChart Message (if you have MyChart) OR A paper copy in the mail If you have any lab test that is abnormal or we need to change your treatment, we will call you to review the results.  Testing/Procedures: None ordered.  Follow-Up: At [facility], you and your health needs are our priority.  As part of our continuing mission to provide you with exceptional heart care, we have created designated Provider Care Teams.  These Care Teams include your primary Cardiologist (physician) and Advanced Practice Providers (APPs -  Physician Assistants and Nurse Practitioners) who all work together to provide you with the care you need, when you need it.  Your next appointment:   1 year(s)  The format for your next appointment:   In Person  Provider:   Lewayne Bunting, MD{or one of the following Advanced Practice Providers on your designated Care Team:   Francis Dowse, [place] Casimiro Needle "Mardelle Matte" Lanna Poche, [place]  Remote monitoring is used to monitor your Pacemaker from home. This monitoring reduces the number of office visits required to check your device to one time per year. It allows [place] to keep an eye on the functioning of your device to ensure it is working properly. You are scheduled for a device check from home on 05/09/22. You may send your transmission at any time that day. If you have a wireless device, the transmission will be sent automatically. After your physician reviews your transmission, you will receive a postcard with your next transmission date.

## 2022-04-28 NOTE — Progress Notes (Signed)
HPI [name] returns today for followup of HTN and CHB, s/p PPM insertion. She has HTN.  She has class 2 CHF symptoms. She has lost another 16 lbs.   She has not had syncope and has minimal palpitations.  She has been distressed about her grandchildrens death.  Allergies  Allergen Reactions   Moxifloxacin Hives, Shortness Of Breath and Other (See Comments)    Caused syncope and throat tightness   Penicillins Hives, Shortness Of Breath and Other (See Comments)    Caused syncope and throat tightness Has patient had a PCN reaction causing immediate rash, facial/tongue/throat swelling, SOB or lightheadedness with hypotension: Yes Has patient had a PCN reaction causing severe rash involving mucus membranes or skin necrosis: No Has patient had a PCN reaction that required hospitalization: hospital visit but not overnight Has patient had a PCN reaction occurring within the last 10 years: No If all of the above answers are "NO", then may proceed with Cephalosporin   Arimidex [Anastrozole]      Current Outpatient Medications  Medication Sig Dispense Refill   albuterol (VENTOLIN HFA) 108 (90 Base) MCG/ACT inhaler Inhale 1-2 puffs into the lungs every 4 (four) hours as needed for wheezing or shortness of breath. 1 each 6   amLODipine (NORVASC) 10 MG tablet Take 10 mg by mouth daily.     aspirin EC 81 MG tablet Take 81 mg by mouth daily.     budesonide-formoterol (SYMBICORT) 80-4.5 MCG/ACT inhaler Inhale 2 puffs into the lungs in the morning and at bedtime. 1 each 12   COVID-19 mRNA bivalent vaccine, Moderna, (MODERNA COVID-19 BIVAL BOOSTER) 50 MCG/0.5ML injection Inject into the muscle. (Patient not taking: Reported on 11/10/2021) 0.5 mL 0   [employer] PURELAX 17 GM/SCOOP powder Take by mouth daily. (Patient not taking: Reported on 11/10/2021)     fexofenadine (ALLEGRA) 180 MG tablet Take 1 tablet (180 mg total) by mouth daily. 30 tablet 11   fluticasone (FLONASE) 50 MCG/ACT nasal spray Place 1  spray into both nostrils daily. 16 g 11   hydrochlorothiazide (HYDRODIURIL) 25 MG tablet Take 25 mg by mouth daily.      ipratropium (ATROVENT) 0.03 % nasal spray Place 2 sprays into both nostrils every 12 (twelve) hours. 30 mL 12   JARDIANCE 25 MG TABS tablet Take 25 mg by mouth every morning. (Patient not taking: Reported on 11/10/2021)     losartan (COZAAR) 100 MG tablet Take 100 mg by mouth daily.     lovastatin (MEVACOR) 10 MG tablet Take 1 tablet by mouth daily.     metformin (FORTAMET) 1000 MG (OSM) 24 hr tablet Take 1,000 mg by mouth 2 (two) times daily.     metFORMIN (GLUCOPHAGE) 1000 MG tablet SMARTSIG:1 Tablet(s) By Mouth Morning-Evening     montelukast (SINGULAIR) 10 MG tablet SMARTSIG:1 Tablet(s) By Mouth Every Evening (Patient not taking: Reported on 11/10/2021) 30 tablet 11   nystatin (MYCOSTATIN) 100000 UNIT/ML suspension SMARTSIG:4 Milliliter(s) By Mouth 4 Times Daily 60 mL 6   nystatin-triamcinolone (MYCOLOG II) cream Apply to affected area daily 15 g 0   omeprazole (PRILOSEC) 20 MG capsule TAKE 1 CAPSULE (20 MG TOTAL) BY MOUTH 2 (TWO) TIMES DAILY BEFORE A MEAL. 180 capsule 3   Polyethyl Glycol-Propyl Glycol (SYSTANE OP) Place 1 drop into both eyes daily as needed (dry eyes).     Spacer/Aero-Holding Chambers (AEROCHAMBER MV) inhaler Use as instructed 1 each 0   No current facility-administered medications for this visit.  Past Medical History:  Diagnosis Date   Allergic rhinitis    Arthritis    "left leg" (02/04/2013)   Asthma    CHF (congestive heart failure)    Chronic bronchitis    "usually get it q yr" (02/04/2013)   GERD (gastroesophageal reflux disease)    Glaucoma    Gout    "used to; haven't had it lately" (02/04/2013)   Hypertension    Pacemaker    PONV (postoperative nausea and vomiting)    Symptomatic bradycardia    s/p STJ dual chamber pacemaker by Dr Ladona Ridgel 02/2013   Type II diabetes mellitus     ROS:   All systems reviewed and negative except as  noted in the HPI.   Past Surgical History:  Procedure Laterality Date   ABDOMINAL HYSTERECTOMY  ?1980's   BREAST BIOPSY Right    "benign"   BREAST LUMPECTOMY Right    "benign"   DILATION AND CURETTAGE OF UTERUS     GANGLION CYST EXCISION Right 2003   Ganglion of wrist, volar and distal radius   PACEMAKER INSERTION  02/04/2013   STJ Assurity dual chamber pacemaker implanted by Dr Ladona Ridgel for symptomatic bradycardia   PERMANENT PACEMAKER INSERTION N/A 02/04/2013   Procedure: PERMANENT PACEMAKER INSERTION;  Surgeon: Marinus Maw, MD;  Location: [facility] C[facility] LAB;  Service: Cardiovascular;  Laterality: N/A;   TUBAL LIGATION       Family History  Problem Relation Age of Onset   Breast cancer Mother    Emphysema Father    Colon polyps Neg Hx    Colon cancer Neg Hx    Esophageal cancer Neg Hx    Rectal cancer Neg Hx    Stomach cancer Neg Hx      Social History   Socioeconomic History   Marital status: Divorced    Spouse name: Not on file   Number of children: Not on file   Years of education: Not on file   Highest education level: Not on file  Occupational History   Occupation: disabled   Tobacco Use   Smoking status: Never   Smokeless tobacco: Never  Vaping Use   Vaping Use: Never used  Substance and Sexual Activity   Alcohol use: Not Currently    Comment: rarely   Drug use: No   Sexual activity: Not on file  Other Topics Concern   Not on file  Social History Narrative   Not on file   Social Determinants of Health   Financial Resource Strain: Not on file  Food Insecurity: Not on file  Transportation Needs: Not on file  Physical Activity: Not on file  Stress: Not on file  Social Connections: Not on file  Intimate Partner Violence: Not on file     BP 124/72   Pulse 60   Ht  (1.626 m)   Wt 160 lb 12.8 oz (72.9 kg)   SpO2 99%   BMI 27.60 kg/m   Physical Exam:  Well appearing NAD HEENT: Unremarkable Neck:  No JVD, no thyromegally Lymphatics:  No  adenopathy Back:  No CVA tenderness Lungs:  Clear HEART:  Regular rate rhythm, no murmurs, no rubs, no clicks Abd:  soft, positive bowel sounds, no organomegally, no rebound, no guarding Ext:  2 plus pulses, no edema, no cyanosis, no clubbing Skin:  No rashes no nodules Neuro:  CN II through XII intact, motor grossly intact  EKG  DEVICE  Normal device function.  See PaceArt for details.   Assess/Plan:  CHB - she is asymptomatic s/p PPM insertion.  PPM - her [employer] Jude DDD PM is working normally. She is about 1.3 years from ERI. HTN - her bp is controlled. No changed. Obesity - she is encouraged to lose weight.    Sharlot Gowda Marixa Mellott,MD

## 2022-05-09 ENCOUNTER — Ambulatory Visit (INDEPENDENT_AMBULATORY_CARE_PROVIDER_SITE_OTHER): Payer: Medicare Other

## 2022-05-09 DIAGNOSIS — I442 Atrioventricular block, complete: Secondary | ICD-10-CM

## 2022-05-09 LAB — CUP PACEART REMOTE DEVICE CHECK
Battery Remaining Longevity: 16 mo
Battery Remaining Percentage: 12 %
Battery Voltage: 2.86 V
Brady Statistic AP VP Percent: 60 %
Brady Statistic AP VS Percent: 1 %
Brady Statistic AS VP Percent: 40 %
Brady Statistic AS VS Percent: 1 %
Brady Statistic RA Percent Paced: 60 %
Brady Statistic RV Percent Paced: 99 %
Date Time Interrogation Session: 20240506020014
Implantable Lead Connection Status: 753985
Implantable Lead Connection Status: 753985
Implantable Lead Implant Date: 20150202
Implantable Lead Implant Date: 20150202
Implantable Lead Location: 753859
Implantable Lead Location: 753860
Implantable Pulse Generator Implant Date: 20150202
Lead Channel Impedance Value: 480 Ohm
Lead Channel Impedance Value: 480 Ohm
Lead Channel Pacing Threshold Amplitude: 0.5 V
Lead Channel Pacing Threshold Amplitude: 0.75 V
Lead Channel Pacing Threshold Pulse Width: 0.4 ms
Lead Channel Pacing Threshold Pulse Width: 0.4 ms
Lead Channel Sensing Intrinsic Amplitude: 5 mV
Lead Channel Sensing Intrinsic Amplitude: 9 mV
Lead Channel Setting Pacing Amplitude: 1 V
Lead Channel Setting Pacing Amplitude: 2 V
Lead Channel Setting Pacing Pulse Width: 0.4 ms
Lead Channel Setting Sensing Sensitivity: 2 mV
Pulse Gen Model: 2240
Pulse Gen Serial Number: 7588973

## 2022-06-08 NOTE — Progress Notes (Signed)
Remote pacemaker transmission.   

## 2022-06-11 ENCOUNTER — Other Ambulatory Visit: Payer: Self-pay | Admitting: Internal Medicine

## 2022-06-12 ENCOUNTER — Other Ambulatory Visit: Payer: Self-pay | Admitting: Pulmonary Disease

## 2022-06-12 DIAGNOSIS — J4530 Mild persistent asthma, uncomplicated: Secondary | ICD-10-CM

## 2022-06-13 ENCOUNTER — Other Ambulatory Visit: Payer: Self-pay | Admitting: Pulmonary Disease

## 2022-06-13 DIAGNOSIS — J4530 Mild persistent asthma, uncomplicated: Secondary | ICD-10-CM

## 2022-06-14 ENCOUNTER — Other Ambulatory Visit: Payer: Self-pay | Admitting: Pulmonary Disease

## 2022-06-14 DIAGNOSIS — J4530 Mild persistent asthma, uncomplicated: Secondary | ICD-10-CM

## 2022-06-27 ENCOUNTER — Other Ambulatory Visit: Payer: Self-pay | Admitting: Pulmonary Disease

## 2022-06-27 DIAGNOSIS — J4530 Mild persistent asthma, uncomplicated: Secondary | ICD-10-CM

## 2022-07-21 ENCOUNTER — Inpatient Hospital Stay: Admission: RE | Admit: 2022-07-21 | Payer: Medicare Other | Source: Ambulatory Visit

## 2022-08-04 ENCOUNTER — Telehealth: Payer: Self-pay | Admitting: Pulmonary Disease

## 2022-08-04 NOTE — Telephone Encounter (Signed)
Patient called to inform the nurse or doctor that she needs a different medication from Advair.  She stated she cannot afford that one and needs an alternative.  She also would like a refill on the mouthwash.  She would like it sent to [facility] on [address].  Please advise.  CB# [phone]

## 2022-08-05 NOTE — Telephone Encounter (Signed)
Can you run a ticket on patients insurance to see what else is covered?  Please route message back to triage

## 2022-08-08 ENCOUNTER — Other Ambulatory Visit (HOSPITAL_COMMUNITY): Payer: Self-pay

## 2022-08-08 ENCOUNTER — Ambulatory Visit (INDEPENDENT_AMBULATORY_CARE_PROVIDER_SITE_OTHER): Payer: Medicare Other

## 2022-08-08 ENCOUNTER — Other Ambulatory Visit: Payer: Self-pay | Admitting: Pulmonary Disease

## 2022-08-08 DIAGNOSIS — I442 Atrioventricular block, complete: Secondary | ICD-10-CM

## 2022-08-08 DIAGNOSIS — B370 Candidal stomatitis: Secondary | ICD-10-CM

## 2022-08-08 LAB — CUP PACEART REMOTE DEVICE CHECK
Battery Remaining Longevity: 13 mo
Battery Remaining Percentage: 11 %
Battery Voltage: 2.84 V
Brady Statistic AP VP Percent: 51 %
Brady Statistic AP VS Percent: 1 %
Brady Statistic AS VP Percent: 49 %
Brady Statistic AS VS Percent: 1 %
Brady Statistic RA Percent Paced: 51 %
Brady Statistic RV Percent Paced: 99 %
Date Time Interrogation Session: 20240805025447
Implantable Lead Connection Status: 753985
Implantable Lead Connection Status: 753985
Implantable Lead Implant Date: 20150202
Implantable Lead Implant Date: 20150202
Implantable Lead Location: 753859
Implantable Lead Location: 753860
Implantable Pulse Generator Implant Date: 20150202
Lead Channel Impedance Value: 480 Ohm
Lead Channel Impedance Value: 490 Ohm
Lead Channel Pacing Threshold Amplitude: 0.5 V
Lead Channel Pacing Threshold Amplitude: 0.875 V
Lead Channel Pacing Threshold Pulse Width: 0.4 ms
Lead Channel Pacing Threshold Pulse Width: 0.4 ms
Lead Channel Sensing Intrinsic Amplitude: 2.6 mV
Lead Channel Sensing Intrinsic Amplitude: 4.3 mV
Lead Channel Setting Pacing Amplitude: 1.125
Lead Channel Setting Pacing Amplitude: 2 V
Lead Channel Setting Pacing Pulse Width: 0.4 ms
Lead Channel Setting Sensing Sensitivity: 2 mV
Pulse Gen Model: 2240
Pulse Gen Serial Number: 7588973

## 2022-08-08 NOTE — Telephone Encounter (Signed)
Per test claims Advair HFA is covered for $4.60

## 2022-08-09 NOTE — Telephone Encounter (Signed)
Left message for patient to call back. She is overdue for an appt.

## 2022-08-13 ENCOUNTER — Observation Stay (HOSPITAL_COMMUNITY)
Admission: EM | Admit: 2022-08-13 | Discharge: 2022-08-14 | Disposition: A | Discharge status: Home / Self Care | Payer: Medicare Other | Attending: Internal Medicine | Admitting: Internal Medicine

## 2022-08-13 ENCOUNTER — Emergency Department (HOSPITAL_COMMUNITY): Payer: Medicare Other

## 2022-08-13 ENCOUNTER — Other Ambulatory Visit: Payer: Self-pay

## 2022-08-13 ENCOUNTER — Observation Stay (HOSPITAL_COMMUNITY): Payer: Medicare Other

## 2022-08-13 ENCOUNTER — Encounter (HOSPITAL_COMMUNITY): Payer: Self-pay

## 2022-08-13 DIAGNOSIS — Z95810 Presence of automatic (implantable) cardiac defibrillator: Secondary | ICD-10-CM | POA: Diagnosis not present

## 2022-08-13 DIAGNOSIS — D104 Benign neoplasm of tonsil: Secondary | ICD-10-CM | POA: Diagnosis not present

## 2022-08-13 DIAGNOSIS — I509 Heart failure, unspecified: Secondary | ICD-10-CM | POA: Insufficient documentation

## 2022-08-13 DIAGNOSIS — J45909 Unspecified asthma, uncomplicated: Secondary | ICD-10-CM | POA: Diagnosis not present

## 2022-08-13 DIAGNOSIS — R29818 Other symptoms and signs involving the nervous system: Secondary | ICD-10-CM | POA: Diagnosis not present

## 2022-08-13 DIAGNOSIS — R7989 Other specified abnormal findings of blood chemistry: Secondary | ICD-10-CM | POA: Insufficient documentation

## 2022-08-13 DIAGNOSIS — J358 Other chronic diseases of tonsils and adenoids: Secondary | ICD-10-CM

## 2022-08-13 DIAGNOSIS — R471 Dysarthria and anarthria: Secondary | ICD-10-CM

## 2022-08-13 DIAGNOSIS — Z7982 Long term (current) use of aspirin: Secondary | ICD-10-CM | POA: Insufficient documentation

## 2022-08-13 DIAGNOSIS — Z79899 Other long term (current) drug therapy: Secondary | ICD-10-CM | POA: Diagnosis not present

## 2022-08-13 DIAGNOSIS — I110 Hypertensive heart disease with heart failure: Secondary | ICD-10-CM | POA: Diagnosis not present

## 2022-08-13 DIAGNOSIS — E119 Type 2 diabetes mellitus without complications: Secondary | ICD-10-CM | POA: Insufficient documentation

## 2022-08-13 DIAGNOSIS — R531 Weakness: Secondary | ICD-10-CM | POA: Diagnosis present

## 2022-08-13 DIAGNOSIS — G459 Transient cerebral ischemic attack, unspecified: Principal | ICD-10-CM

## 2022-08-13 LAB — CBC
HCT: 40.2 % (ref 36.0–46.0)
Hemoglobin: 12.4 g/dL (ref 12.0–15.0)
MCH: 27.1 pg (ref 26.0–34.0)
MCHC: 30.8 g/dL (ref 30.0–36.0)
MCV: 87.8 fL (ref 80.0–100.0)
Platelets: 252 10*3/uL (ref 150–400)
RBC: 4.58 MIL/uL (ref 3.87–5.11)
RDW: 14 % (ref 11.5–15.5)
WBC: 8.2 10*3/uL (ref 4.0–10.5)
nRBC: 0 % (ref 0.0–0.2)

## 2022-08-13 LAB — URINALYSIS, ROUTINE W REFLEX MICROSCOPIC
Bacteria, UA: NONE SEEN
Bilirubin Urine: NEGATIVE
Glucose, UA: NEGATIVE mg/dL
Hgb urine dipstick: NEGATIVE
Ketones, ur: NEGATIVE mg/dL
Nitrite: NEGATIVE
Protein, ur: NEGATIVE mg/dL
Specific Gravity, Urine: 1.019 (ref 1.005–1.030)
pH: 7 (ref 5.0–8.0)

## 2022-08-13 LAB — PROTIME-INR
INR: 1.1 (ref 0.8–1.2)
Prothrombin Time: 14.2 seconds (ref 11.4–15.2)

## 2022-08-13 LAB — APTT: aPTT: 25 seconds (ref 24–36)

## 2022-08-13 LAB — RAPID URINE DRUG SCREEN, HOSP PERFORMED
Amphetamines: NOT DETECTED
Barbiturates: NOT DETECTED
Benzodiazepines: NOT DETECTED
Cocaine: NOT DETECTED
Opiates: NOT DETECTED
Tetrahydrocannabinol: NOT DETECTED

## 2022-08-13 LAB — LIPID PANEL
Cholesterol: 120 mg/dL (ref 0–200)
HDL: 39 mg/dL — ABNORMAL LOW (ref >40)
LDL Cholesterol: 67 mg/dL (ref 0–99)
Total CHOL/HDL Ratio: 3.1 RATIO
Triglycerides: 70 mg/dL (ref <150)
VLDL: 14 mg/dL (ref 0–40)

## 2022-08-13 LAB — DIFFERENTIAL
Abs Immature Granulocytes: 0.01 10*3/uL (ref 0.00–0.07)
Basophils Absolute: 0.1 10*3/uL (ref 0.0–0.1)
Basophils Relative: 1 %
Eosinophils Absolute: 0.1 10*3/uL (ref 0.0–0.5)
Eosinophils Relative: 1 %
Immature Granulocytes: 0 %
Lymphocytes Relative: 59 %
Lymphs Abs: 4.9 10*3/uL — ABNORMAL HIGH (ref 0.7–4.0)
Monocytes Absolute: 0.7 10*3/uL (ref 0.1–1.0)
Monocytes Relative: 8 %
Neutro Abs: 2.5 10*3/uL (ref 1.7–7.7)
Neutrophils Relative %: 31 %

## 2022-08-13 LAB — COMPREHENSIVE METABOLIC PANEL
ALT: 13 U/L (ref 0–44)
AST: 16 U/L (ref 15–41)
Albumin: 3.4 g/dL — ABNORMAL LOW (ref 3.5–5.0)
Alkaline Phosphatase: 85 U/L (ref 38–126)
Anion gap: 15 (ref 5–15)
BUN: 19 mg/dL (ref 8–23)
CO2: 18 mmol/L — ABNORMAL LOW (ref 22–32)
Calcium: 8.6 mg/dL — ABNORMAL LOW (ref 8.9–10.3)
Chloride: 103 mmol/L (ref 98–111)
Creatinine, Ser: 1.29 mg/dL — ABNORMAL HIGH (ref 0.44–1.00)
GFR, Estimated: 44 mL/min — ABNORMAL LOW (ref >60)
Glucose, Bld: 152 mg/dL — ABNORMAL HIGH (ref 70–99)
Potassium: 3.8 mmol/L (ref 3.5–5.1)
Sodium: 136 mmol/L (ref 135–145)
Total Bilirubin: 0.4 mg/dL (ref 0.3–1.2)
Total Protein: 6.7 g/dL (ref 6.5–8.1)

## 2022-08-13 LAB — HEMOGLOBIN A1C
Hgb A1c MFr Bld: 5.9 % — ABNORMAL HIGH (ref 4.8–5.6)
Mean Plasma Glucose: 122.63 mg/dL

## 2022-08-13 LAB — I-STAT CHEM 8, ED
BUN: 19 mg/dL (ref 8–23)
Calcium, Ion: 1.13 mmol/L — ABNORMAL LOW (ref 1.15–1.40)
Chloride: 107 mmol/L (ref 98–111)
Creatinine, Ser: 1.2 mg/dL — ABNORMAL HIGH (ref 0.44–1.00)
Glucose, Bld: 150 mg/dL — ABNORMAL HIGH (ref 70–99)
HCT: 39 % (ref 36.0–46.0)
Hemoglobin: 13.3 g/dL (ref 12.0–15.0)
Potassium: 3.9 mmol/L (ref 3.5–5.1)
Sodium: 140 mmol/L (ref 135–145)
TCO2: 18 mmol/L — ABNORMAL LOW (ref 22–32)

## 2022-08-13 LAB — CBG MONITORING, ED: Glucose-Capillary: 142 mg/dL — ABNORMAL HIGH (ref 70–99)

## 2022-08-13 LAB — GLUCOSE, CAPILLARY: Glucose-Capillary: 169 mg/dL — ABNORMAL HIGH (ref 70–99)

## 2022-08-13 LAB — ETHANOL: Alcohol, Ethyl (B): <10 mg/dL (ref <10)

## 2022-08-13 MED ORDER — HYDROCHLOROTHIAZIDE 25 MG PO TABS
25.0000 mg | ORAL_TABLET | Freq: Every day | ORAL | Status: DC
  Administered 2022-08-13 – 2022-08-14 (×2): 25 mg via ORAL
  Filled 2022-08-13 (×2): qty 1

## 2022-08-13 MED ORDER — METFORMIN HCL ER 500 MG PO TB24
1000.0000 mg | ORAL_TABLET | Freq: Two times a day (BID) | ORAL | Status: DC
  Filled 2022-08-13: qty 2

## 2022-08-13 MED ORDER — PRAVASTATIN SODIUM 10 MG PO TABS
10.0000 mg | ORAL_TABLET | Freq: Every day | ORAL | Status: DC
  Administered 2022-08-13: 10 mg via ORAL
  Filled 2022-08-13 (×2): qty 1

## 2022-08-13 MED ORDER — ASPIRIN 81 MG PO CHEW
81.0000 mg | CHEWABLE_TABLET | Freq: Every day | ORAL | Status: DC
  Administered 2022-08-13 – 2022-08-14 (×2): 81 mg via ORAL
  Filled 2022-08-13 (×2): qty 1

## 2022-08-13 MED ORDER — AMLODIPINE BESYLATE 10 MG PO TABS
10.0000 mg | ORAL_TABLET | Freq: Every day | ORAL | Status: DC
  Administered 2022-08-13 – 2022-08-14 (×2): 10 mg via ORAL
  Filled 2022-08-13: qty 1
  Filled 2022-08-13: qty 2

## 2022-08-13 MED ORDER — LOSARTAN POTASSIUM 50 MG PO TABS
100.0000 mg | ORAL_TABLET | Freq: Every day | ORAL | Status: DC
  Administered 2022-08-13 – 2022-08-14 (×2): 100 mg via ORAL
  Filled 2022-08-13 (×2): qty 2

## 2022-08-13 MED ORDER — IOHEXOL 350 MG/ML SOLN
75.0000 mL | Freq: Once | INTRAVENOUS | Status: AC | PRN
  Administered 2022-08-13: 75 mL via INTRAVENOUS

## 2022-08-13 MED ORDER — METFORMIN HCL 500 MG PO TABS: 1000.0000 mg | ORAL_TABLET | Freq: Every day | ORAL | Status: DC

## 2022-08-13 MED ORDER — MOMETASONE FURO-FORMOTEROL FUM 100-5 MCG/ACT IN AERO
2.0000 | INHALATION_SPRAY | Freq: Two times a day (BID) | RESPIRATORY_TRACT | Status: DC
  Administered 2022-08-13 – 2022-08-14 (×2): 2 via RESPIRATORY_TRACT
  Filled 2022-08-13: qty 8.8

## 2022-08-13 NOTE — ED Notes (Signed)
ED TO INPATIENT HANDOFF REPORT  ED Nurse Name and Phone #: Marchelle Folks RN [phone]  S Name/Age/Gender Jessica Carpenter 72 y.o. female Room/Bed: 022C/022C  Code Status   Code Status: Prior  Home/SNF/Other Home Patient oriented to: self, place, time, and situation Is this baseline? Yes   Triage Complete: Triage complete  Chief Complaint code stroke  Triage Note Pt coming in from home husband noticed she had a facial droop and was having bilateral weakness along with slurred speech lasting about 5 min.    Allergies Allergies  Allergen Reactions   Moxifloxacin Hives, Shortness Of Breath and Other (See Comments)    Caused syncope and throat tightness   Penicillins Hives, Shortness Of Breath and Other (See Comments)    Caused syncope and throat tightness Has patient had a PCN reaction causing immediate rash, facial/tongue/throat swelling, SOB or lightheadedness with hypotension: Yes Has patient had a PCN reaction causing severe rash involving mucus membranes or skin necrosis: No Has patient had a PCN reaction that required hospitalization: hospital visit but not overnight Has patient had a PCN reaction occurring within the last 10 years: No If all of the above answers are "NO", then may proceed with Cephalosporin   Arimidex [Anastrozole]     Level of Care/Admitting Diagnosis ED Disposition     ED Disposition  Admit   Condition  --   Comment  The patient appears reasonably stabilized for admission considering the current resources, flow, and capabilities available in the ED at this time, and I doubt any other [facility] requiring further screening and/or treatment in the ED prior to admission is  present.          B Medical/Surgery History Past Medical History:  Diagnosis Date   Allergic rhinitis    Arthritis    "left leg" (02/04/2013)   Asthma    CHF (congestive heart failure) (HCC)    Chronic bronchitis (HCC)    "usually get it q yr" (02/04/2013)   GERD (gastroesophageal  reflux disease)    Glaucoma    Gout    "used to; haven't had it lately" (02/04/2013)   Hypertension    Pacemaker    PONV (postoperative nausea and vomiting)    Symptomatic bradycardia    s/p STJ dual chamber pacemaker by Dr Ladona Ridgel 02/2013   Type II diabetes mellitus (HCC)    Past Surgical History:  Procedure Laterality Date   ABDOMINAL HYSTERECTOMY  ?1980's   BREAST BIOPSY Right    "benign"   BREAST LUMPECTOMY Right    "benign"   DILATION AND CURETTAGE OF UTERUS     GANGLION CYST EXCISION Right 2003   Ganglion of wrist, volar and distal radius   PACEMAKER INSERTION  02/04/2013   STJ Assurity dual chamber pacemaker implanted by Dr Ladona Ridgel for symptomatic bradycardia   PERMANENT PACEMAKER INSERTION N/A 02/04/2013   Procedure: PERMANENT PACEMAKER INSERTION;  Surgeon: Marinus Maw, MD;  Location: [facility] CATH LAB;  Service: Cardiovascular;  Laterality: N/A;   TUBAL LIGATION       A IV Location/Drains/Wounds Patient Lines/Drains/Airways Status     Active Line/Drains/Airways     Name Placement date Placement time Site Days   Peripheral IV 08/13/22 18 G Right Antecubital 08/13/22  0543  Antecubital  less than 1            Intake/Output Last 24 hours No intake or output data in the 24 hours ending 08/13/22 0745  Labs/Imaging Results for orders placed or performed during the hospital encounter  of 08/13/22 (from the past 48 hour(s))  Ethanol     Status: None   Collection Time: 08/13/22  4:59 AM  Result Value Ref Range   Alcohol, Ethyl (B) <10 <10 mg/dL    Comment: (NOTE) Lowest detectable limit for serum alcohol is 10 mg/dL.  For medical purposes only. Performed at [facility], [address], [place], [place] [address]   CBC     Status: None   Collection Time: 08/13/22  4:59 AM  Result Value Ref Range   WBC 8.2 4.0 - 10.5 K/uL   RBC 4.58 3.87 - 5.11 MIL/uL   Hemoglobin 12.4 12.0 - 15.0 g/dL   HCT 60.4 54.0 - 98.1 %   MCV 87.8 80.0 - 100.0 fL   MCH 27.1 26.0 -  34.0 pg   MCHC 30.8 30.0 - 36.0 g/dL   RDW 19.1 47.8 - 29.5 %   Platelets 252 150 - 400 K/uL   nRBC 0.0 0.0 - 0.2 %    Comment: Performed at [facility], [address] [place], [place] [address]  Differential     Status: Abnormal   Collection Time: 08/13/22  4:59 AM  Result Value Ref Range   Neutrophils Relative % 31 %   Neutro Abs 2.5 1.7 - 7.7 K/uL   Lymphocytes Relative 59 %   Lymphs Abs 4.9 (H) 0.7 - 4.0 K/uL   Monocytes Relative 8 %   Monocytes Absolute 0.7 0.1 - 1.0 K/uL   Eosinophils Relative 1 %   Eosinophils Absolute 0.1 0.0 - 0.5 K/uL   Basophils Relative 1 %   Basophils Absolute 0.1 0.0 - 0.1 K/uL   Immature Granulocytes 0 %   Abs Immature Granulocytes 0.01 0.00 - 0.07 K/uL    Comment: Performed at [facility], [address], [place], [place] [address]  Comprehensive metabolic panel     Status: Abnormal   Collection Time: 08/13/22  4:59 AM  Result Value Ref Range   Sodium 136 135 - 145 mmol/L   Potassium 3.8 3.5 - 5.1 mmol/L   Chloride 103 98 - 111 mmol/L   CO2 18 (L) 22 - 32 mmol/L   Glucose, Bld 152 (H) 70 - 99 mg/dL    Comment: Glucose reference range applies only to samples taken after fasting for at least 8 hours.   BUN 19 8 - 23 mg/dL   Creatinine, Ser 4.69 (H) 0.44 - 1.00 mg/dL   Calcium 8.6 (L) 8.9 - 10.3 mg/dL   Total Protein 6.7 6.5 - 8.1 g/dL   Albumin 3.4 (L) 3.5 - 5.0 g/dL   AST 16 15 - 41 U/L   ALT 13 0 - 44 U/L   Alkaline Phosphatase 85 38 - 126 U/L   Total Bilirubin 0.4 0.3 - 1.2 mg/dL   GFR, Estimated 44 (L) >60 mL/min    Comment: (NOTE) Calculated using the CKD-EPI Creatinine Equation (2021)    Anion gap 15 5 - 15    Comment: Performed at [facility], [address] [place], [place] [address]  I-stat chem 8, ED     Status: Abnormal   Collection Time: 08/13/22  5:05 AM  Result Value Ref Range   Sodium 140 135 - 145 mmol/L   Potassium 3.9 3.5 - 5.1 mmol/L   Chloride 107 98 - 111 mmol/L   BUN 19 8 - 23 mg/dL    Creatinine, Ser 8.41 (H) 0.44 - 1.00 mg/dL   Glucose, Bld  150 (H) 70 - 99 mg/dL    Comment: Glucose reference range applies only to samples taken after fasting for at least 8 hours.   Calcium, Ion 1.13 (L) 1.15 - 1.40 mmol/L   TCO2 18 (L) 22 - 32 mmol/L   Hemoglobin 13.3 12.0 - 15.0 g/dL   HCT 16.1 09.6 - 04.5 %  CBG monitoring, ED     Status: Abnormal   Collection Time: 08/13/22  5:07 AM  Result Value Ref Range   Glucose-Capillary 142 (H) 70 - 99 mg/dL    Comment: Glucose reference range applies only to samples taken after fasting for at least 8 hours.  Urine rapid drug screen (hosp performed)     Status: None   Collection Time: 08/13/22  5:35 AM  Result Value Ref Range   Opiates NONE DETECTED NONE DETECTED   Cocaine NONE DETECTED NONE DETECTED   Benzodiazepines NONE DETECTED NONE DETECTED   Amphetamines NONE DETECTED NONE DETECTED   Tetrahydrocannabinol NONE DETECTED NONE DETECTED   Barbiturates NONE DETECTED NONE DETECTED    Comment: (NOTE) DRUG SCREEN FOR MEDICAL PURPOSES ONLY.  IF CONFIRMATION IS NEEDED FOR ANY PURPOSE, NOTIFY LAB WITHIN 5 DAYS.  LOWEST DETECTABLE LIMITS FOR URINE DRUG SCREEN Drug Class                     Cutoff (ng/mL) Amphetamine and metabolites    1000 Barbiturate and metabolites    200 Benzodiazepine                 200 Opiates and metabolites        300 Cocaine and metabolites        300 THC                            50 Performed at [facility], [address], [place], [place] [address]   Urinalysis, Routine w reflex microscopic -Urine, Clean Catch     Status: Abnormal   Collection Time: 08/13/22  5:35 AM  Result Value Ref Range   Color, Urine STRAW (A) YELLOW   APPearance CLEAR CLEAR   Specific Gravity, Urine 1.019 1.005 - 1.030   pH 7.0 5.0 - 8.0   Glucose, UA NEGATIVE NEGATIVE mg/dL   Hgb urine dipstick NEGATIVE NEGATIVE   Bilirubin Urine NEGATIVE NEGATIVE   Ketones, ur NEGATIVE NEGATIVE mg/dL   Protein, ur NEGATIVE NEGATIVE  mg/dL   Nitrite NEGATIVE NEGATIVE   Leukocytes,Ua SMALL (A) NEGATIVE   RBC / HPF 0-5 0 - 5 RBC/hpf   WBC, UA 0-5 0 - 5 WBC/hpf   Bacteria, UA NONE SEEN NONE SEEN   Squamous Epithelial / HPF 0-5 0 - 5 /HPF    Comment: Performed at [facility], [address] [place], [place] [address]  Protime-INR     Status: None   Collection Time: 08/13/22  6:55 AM  Result Value Ref Range   Prothrombin Time 14.2 11.4 - 15.2 seconds   INR 1.1 0.8 - 1.2    Comment: (NOTE) INR goal varies based on device and disease states. Performed at [facility], [address], [place], [place] [address]   APTT     Status: None   Collection Time: 08/13/22  6:55 AM  Result Value Ref Range   aPTT 25 24 - 36 seconds    Comment: Performed at [facility], [address], [place], [place] [address]  CT ANGIO HEAD NECK W WO CM (CODE STROKE)  Result Date: 08/13/2022 CLINICAL DATA:  72 year old female code stroke presentation. EXAM: CT ANGIOGRAPHY HEAD AND NECK TECHNIQUE: Multidetector CT imaging of the head and neck was performed using the standard protocol during bolus administration of intravenous contrast. Multiplanar CT image reconstructions and MIPs were obtained to evaluate the vascular anatomy. Carotid stenosis measurements (when applicable) are obtained utilizing NASCET criteria, using the distal internal carotid diameter as the denominator. RADIATION DOSE REDUCTION: This exam was performed according to the departmental dose-optimization program which includes automated exposure control, adjustment of the mA and/or kV according to patient size and/or use of iterative reconstruction technique. CONTRAST:  75mL OMNIPAQUE IOHEXOL 350 MG/ML SOLN COMPARISON:  Plain head CT 0505 hours today. FINDINGS: CTA NECK Skeleton: Mild for age cervical spine degeneration. No acute osseous abnormality identified. Upper chest: Left chest pacemaker type device. Mild dependent atelectasis. Negative visible mediastinum.  Other neck: Bilateral subcentimeter thyroid nodules. Not clinically significant; no follow-up imaging recommended (ref: J Am Coll Radiol. 2015 Feb;12(2): 143-50). There is asymmetric 15 mm nodular increased enhancing soft tissue at the left pharyngeal lateral wall, left tonsil series 5, image 88 and series 8, image 112. Suboptimal evaluation for cervical lymph nodes given CTA timing. No obvious malignant left-side lymphadenopathy. Right side level 2 nodes are up to 9 mm short axis. Other pharyngeal and laryngeal soft tissue contours appear negative. Aortic arch: Streak artifact from dense left innominate venous contrast. Three vessel arch. Minor arch atherosclerosis. Right carotid system: Streak artifact affecting the brachiocephalic artery and proximal right CCA which appear tortuous but patent with no convincing plaque or stenosis. Minimal soft plaque at the posterior right ICA origin. Otherwise negative right carotid bifurcation. No stenosis to the skull base. Left carotid system: Streak at the left CCA origin. Mildly tortuous proximal left CCA with no convincing plaque or stenosis. Mild soft plaque posterior left ICA origin. No stenosis to the skull base, but there is a beaded appearance of the cervical left ICA typical of Fibromuscular Dysplasia (FMD). On series 12, image 26. Contralateral cervical right ICA might also show early changes. Vertebral arteries: Proximal right subclavian artery and right vertebral artery origin are normal. Right vertebral is tortuous and patent to the skull base with no plaque or stenosis. Proximal left subclavian artery is patent with no significant stenosis. Normal left vertebral artery origin. Tortuous left V1 segment. Codominant left vertebral artery is patent to the skull base with V2 segment tortuosity but no significant plaque or stenosis. CTA HEAD Posterior circulation: Patent codominant distal vertebral arteries and vertebrobasilar junction with no plaque or stenosis. Left  AICA appears dominant and patent. Patent right PICA origin. Patent basilar artery with mild tortuosity. Patent SCA and PCA origins. Small infundibulum of the left SCA (normal variant). Tortuous left P1 segment. Posterior communicating arteries are diminutive or absent. Bilateral PCA branches are tortuous and patent. There is mild irregularity of the right P2 segment. Anterior circulation: Both ICA siphons are patent. Left siphon supraclinoid calcified plaque is mild to moderate with no significant stenosis. Right siphon cavernous segment plaque is calcified and mild with no significant stenosis. Patent carotid termini. Patent MCA and ACA origins. Tortuous A1 segments. Diminutive or absent anterior communicating artery. Bilateral ACA branches are within normal limits. Tortuous MCA M1 segments. The left M1 and left MCA trifurcation is patent without stenosis. Right MCA M1 and bifurcation is patent without stenosis. Bilateral MCA branches are within normal limits. Branch density appears symmetric. Venous sinuses:  Early contrast timing, not evaluated. Anatomic variants: None significant. Review of the MIP images confirms the above findings IMPRESSION: 1. 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 No obvious lymph node metastases. Recommend follow-up with ENT and routine Neck CT with IV contrast for staging. 2. Negative for large vessel occlusion. 3. 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 Generalized arterial tortuosity in the head and neck but only mild atherosclerosis and no significant arterial stenosis. Salient findings were communicated to Dr. Otelia Limes at 5:37 am on 08/13/2022 by text page via the [facility] messaging system. Electronically Signed   By: Odessa Fleming M.D.   On: 08/13/2022 05:38   CT HEAD CODE STROKE WO CONTRAST  Result Date: 08/13/2022 CLINICAL DATA:  Code stroke. 72 year old female  with weakness and slurred speech. EXAM: CT HEAD WITHOUT CONTRAST TECHNIQUE: Contiguous axial images were obtained from the base of the skull through the vertex without intravenous contrast. RADIATION DOSE REDUCTION: This exam was performed according to the departmental dose-optimization program which includes automated exposure control, adjustment of the mA and/or kV according to patient size and/or use of iterative reconstruction technique. COMPARISON:  None Available. FINDINGS: Brain: Extensive streak artifact in the posterior fossa, bilateral middle cranial fossa appears related to hair artifact. Less pronounced similar streak in the cerebral hemispheres such as series 3, image 19. Cerebral volume is within normal limits for age. No midline shift, mass effect, or evidence of intracranial mass lesion. No ventriculomegaly. No definite acute intracranial hemorrhage or cortically based infarct. Gray-white differentiation appears symmetric. Vascular: Limited evaluation due to streak artifact. No suspicious intracranial vascular hyperdensity. Calcified atherosclerosis at the skull base. Skull: No acute osseous abnormality identified. Sinuses/Orbits: Visualized paranasal sinuses and mastoids are clear. Other: No gaze deviation.  Negative visible scalp soft tissues. ASPECTS [facility] Stroke Program Early CT Score) Total score (0-10 with 10 being normal): 10 IMPRESSION: 1. Limited by streak artifact. 2. No acute cortically based infarct or acute intracranial hemorrhage identified. ASPECTS 10. 3. Study discussed by telephone with Dr. Otelia Limes on 08/13/2022 at 05:10 . Electronically Signed   By: Odessa Fleming M.D.   On: 08/13/2022 05:13    Pending Labs Unresulted Labs (From admission, onward)    None       Vitals/Pain Today's Vitals   08/13/22 0630 08/13/22 0632 08/13/22 0645  BP: (!) 142/74 (!) 142/74 139/86  Pulse: 65 60 66  Resp:  16 16  Temp:  97.7 F (36.5 C)   TempSrc:  Oral   SpO2: 100% 100% 100%     Isolation Precautions No active isolations  Medications Medications  iohexol (OMNIPAQUE) 350 MG/ML injection 75 mL (75 mLs Intravenous Contrast Given 08/13/22 0509)    Mobility walks     Focused Assessments Neuro Assessment Handoff:  Swallow screen pass? Yes    NIH Stroke Scale  Dizziness Present: No Headache Present: No Interval: Initial Level of Consciousness (1a.)   : Alert, keenly responsive LOC Questions (1b. )   : Answers both questions correctly LOC Commands (1c. )   : Performs both tasks correctly Best Gaze (2. )  : Normal Visual (3. )  : No visual loss Facial Palsy (4. )    : Normal symmetrical movements Motor Arm, Left (5a. )   : No drift Motor Arm, Right (5b. ) : No drift Motor Leg, Left (6a. )  : No drift Motor Leg, Right (6b. ) : No  drift Limb Ataxia (7. ): Absent Sensory (8. )  : Normal, no sensory loss Best Language (9. )  : No aphasia Dysarthria (10. ): Normal Extinction/Inattention (11.)   : No Abnormality Complete NIHSS TOTAL: 0 Last date known well: 08/12/22 Last time known well: 2300 Neuro Assessment:   Neuro Checks:   Initial (08/13/22 0500)  Has TPA been given? No If patient is a Neuro Trauma and patient is going to OR before floor call report to 4N Charge nurse: [phone] or [phone]   R Recommendations: See Admitting Provider Note  Report given to:   Additional Notes: pt ambulated to the bathroom with steady gait.

## 2022-08-13 NOTE — Hospital Course (Addendum)
Code stroke  Neuro saw Symptpsm resoloved  Global weak  Dysarthria Speak diff  Maybe facial droop   Went to sleep, got in a "trance", and just wouldn't respond. Seemed like she was trying to wake up, and husband called EMS. Face was a little to the side. Lasted about five minutes. Right after that was responding well, and woke up fine. She was weak and confused all over. Talking totally normal.  Ever happened before - last year, 7+8 months ago. EMS went out to the house and said she was ok. Was a lot worse that time than what it was this time because she was out for longer.   Pacemaker?   Worse how  Bowel  PMH:  Diabetes  Asthma  Osteoarthritis Complete Heart Block s/p Pacemaker Placement  Meds:  Metformin 1000mg   Losartan 100mg   Amlodipine 10mg   Hydrochlorothiazide 25mg   Aspirin 81mg   Symbicort Inhaler  Lovastatin 10mg     Family History: Mom had a stroke  [phone] Onalee Hua    Social:  PCP: [facility] ADLS IADLS: Independent in all ADLsIADLS Level of Function: Lives with her husband at home. Retired  Substances: Has never smoke cigarrettes, 2-3 drinks a week, denies illicit substances

## 2022-08-13 NOTE — H&P (Cosign Needed Addendum)
Date: 08/13/2022               Patient Name:  Jessica Carpenter MRN: [id number]  DOB: [birth year] Age / Sex: 72 y.o., female   PCP: Medicine, [facility]         Medical Service: Internal Medicine Teaching Service         Attending Physician: Dr. Hoy Finlay, Provider, MD      First Contact: Dr. Monna Fam, MD Pager [phone]    Second Contact: Dr. Olegario Messier, MD Pager [phone]         After Hours (After 5p/  First Contact Pager: [phone]  weekends / holidays): Second Contact Pager: [phone]   SUBJECTIVE   Chief Complaint: weakness, confusion  History of Present Illness:    Patient is a 72 year old woman with past medical history of HTN, T2DM, CHF, asthma, ICD placement who presents to the ED after onset of weakness and facial asymmetry. Patient's husband at the bedside reports he was woken up last night by odd breathing sounds from his wife. He tried to wake her up, but she would not respond, as if she was "in a trance". He also reports her face appeared twisted on the right side. The episode lasted about five minutes, after which the patient had generalized weakness and no memory of the episode. She denies speech deficits, word-finding difficulty, focal weakness, numbness. EMS was called and a Code stroke was called in the ED.   Patient reports she had a similar episode approximately 7 months ago. At that time she also had a short period of weakness and confusion which resolved on its own. EMS was called to the house but she was not brought to the hospital.   ED Course: In the ED Code Stroke was called. CT head showed no acute infarct or hemorrhage. 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 Labs were within normal limits. No leukocytosis, no hypoglycemia. UDS negative. EKG shows sinus bradycardia  Meds:  Metformin 1000mg   Losartan 100mg   Amlodipine 10mg   Hydrochlorothiazide 25mg   Aspirin 81mg    Symbicort Inhaler  Lovastatin 10mg    Past Medical History Diabetes  Asthma  Osteoarthritis Complete Heart Block s/p Pacemaker Placement  Past Surgical History:  Procedure Laterality Date   ABDOMINAL HYSTERECTOMY  ?1980's   BREAST BIOPSY Right    "benign"   BREAST LUMPECTOMY Right    "benign"   DILATION AND CURETTAGE OF UTERUS     GANGLION CYST EXCISION Right 2003   Ganglion of wrist, volar and distal radius   PACEMAKER INSERTION  02/04/2013   STJ Assurity dual chamber pacemaker implanted by Dr Ladona Ridgel for symptomatic bradycardia   PERMANENT PACEMAKER INSERTION N/A 02/04/2013   Procedure: PERMANENT PACEMAKER INSERTION;  Surgeon: Marinus Maw, MD;  Location: [facility] CATH LAB;  Service: Cardiovascular;  Laterality: N/A;   TUBAL LIGATION     Social:  Lives With: Husband Occupation: none Support:  Level of Function: independent in all ADLs and iADLs PCP: [facility] Substances: Never smoker. 2-3 alcoholic drinks/week, no illicit drugs  Family History:  Mother had stroke in her 69s  Allergies: Allergies as of 08/13/2022 - Review Complete 08/13/2022  Allergen Reaction Noted   Moxifloxacin Hives, Shortness Of Breath, and Other (See Comments) 02/10/2009   Penicillins Hives, Shortness Of Breath, and Other (See Comments) 09/15/2006   Arimidex [anastrozole]  01/28/2018    Review of Systems: A complete ROS was  negative except as per HPI.   OBJECTIVE:   Physical Exam: Blood pressure 139/86, pulse 66, temperature 97.7 F (36.5 C), temperature source Oral, resp. rate 16, SpO2 100%.  Constitutional: well-appearing, sitting in bed, in no acute distress HENT: normocephalic atraumatic, mucous membranes moist Eyes: conjunctiva non-erythematous Neck: supple Cardiovascular: regular rate and rhythm, no m/r/g Pulmonary/Chest: normal work of breathing on room air, lungs clear to auscultation bilaterally Abdominal: soft, non-tender, non-distended MSK: normal bulk and  tone Neurological: alert & oriented x 3, 5/5 strength in bilateral upper and lower extremities. Mild right sided facial droop, though husband does not note any abnormality. Fluent speech with no aphasia. No numbness Skin: warm and dry Psych: appropriate mood and affect  Labs: CBC    Component Value Date/Time   WBC 8.2 08/13/2022 0459   RBC 4.58 08/13/2022 0459   HGB 13.3 08/13/2022 0505   HCT 39.0 08/13/2022 0505   PLT 252 08/13/2022 0459   MCV 87.8 08/13/2022 0459   MCH 27.1 08/13/2022 0459   MCHC 30.8 08/13/2022 0459   RDW 14.0 08/13/2022 0459   LYMPHSABS 4.9 (H) 08/13/2022 0459   MONOABS 0.7 08/13/2022 0459   EOSABS 0.1 08/13/2022 0459   BASOSABS 0.1 08/13/2022 0459     CMP     Component Value Date/Time   NA 140 08/13/2022 0505   K 3.9 08/13/2022 0505   CL 107 08/13/2022 0505   CO2 18 (L) 08/13/2022 0459   GLUCOSE 150 (H) 08/13/2022 0505   BUN 19 08/13/2022 0505   CREATININE 1.20 (H) 08/13/2022 0505   CALCIUM 8.6 (L) 08/13/2022 0459   PROT 6.7 08/13/2022 0459   ALBUMIN 3.4 (L) 08/13/2022 0459   AST 16 08/13/2022 0459   ALT 13 08/13/2022 0459   ALKPHOS 85 08/13/2022 0459   BILITOT 0.4 08/13/2022 0459   GFRNONAA 44 (L) 08/13/2022 0459   GFRAA 53 (L) 09/26/2015 0930    Imaging: CT ANGIO HEAD NECK W WO CM (CODE STROKE)  Result Date: 08/13/2022 CLINICAL DATA:  72 year old female code stroke presentation. EXAM: CT ANGIOGRAPHY HEAD AND NECK TECHNIQUE: Multidetector CT imaging of the head and neck was performed using the standard protocol during bolus administration of intravenous contrast. Multiplanar CT image reconstructions and MIPs were obtained to evaluate the vascular anatomy. Carotid stenosis measurements (when applicable) are obtained utilizing NASCET criteria, using the distal internal carotid diameter as the denominator. RADIATION DOSE REDUCTION: This exam was performed according to the departmental dose-optimization program which includes automated exposure  control, adjustment of the mA and/or kV according to patient size and/or use of iterative reconstruction technique. CONTRAST:  75mL OMNIPAQUE IOHEXOL 350 MG/ML SOLN COMPARISON:  Plain head CT 0505 hours today. FINDINGS: CTA NECK Skeleton: Mild for age cervical spine degeneration. No acute osseous abnormality identified. Upper chest: Left chest pacemaker type device. Mild dependent atelectasis. Negative visible mediastinum. Other neck: Bilateral subcentimeter thyroid nodules. Not clinically significant; no follow-up imaging recommended (ref: J Am Coll Radiol. 2015 Feb;12(2): 143-50). There is asymmetric 15 mm nodular increased enhancing soft tissue at the left pharyngeal lateral wall, left tonsil series 5, image 88 and series 8, image 112. Suboptimal evaluation for cervical lymph nodes given CTA timing. No obvious malignant left-side lymphadenopathy. Right side level 2 nodes are up to 9 mm short axis. Other pharyngeal and laryngeal soft tissue contours appear negative. Aortic arch: Streak artifact from dense left innominate venous contrast. Three vessel arch. Minor arch atherosclerosis. Right carotid system: Streak artifact affecting the brachiocephalic artery and proximal right  CCA which appear tortuous but patent with no convincing plaque or stenosis. Minimal soft plaque at the posterior right ICA origin. Otherwise negative right carotid bifurcation. No stenosis to the skull base. Left carotid system: Streak at the left CCA origin. Mildly tortuous proximal left CCA with no convincing plaque or stenosis. Mild soft plaque posterior left ICA origin. No stenosis to the skull base, but there is a beaded appearance of the cervical left ICA typical of Fibromuscular Dysplasia (FMD). On series 12, image 26. Contralateral cervical right ICA might also show early changes. Vertebral arteries: Proximal right subclavian artery and right vertebral artery origin are normal. Right vertebral is tortuous and patent to the skull base  with no plaque or stenosis. Proximal left subclavian artery is patent with no significant stenosis. Normal left vertebral artery origin. Tortuous left V1 segment. Codominant left vertebral artery is patent to the skull base with V2 segment tortuosity but no significant plaque or stenosis. CTA HEAD Posterior circulation: Patent codominant distal vertebral arteries and vertebrobasilar junction with no plaque or stenosis. Left AICA appears dominant and patent. Patent right PICA origin. Patent basilar artery with mild tortuosity. Patent SCA and PCA origins. Small infundibulum of the left SCA (normal variant). Tortuous left P1 segment. Posterior communicating arteries are diminutive or absent. Bilateral PCA branches are tortuous and patent. There is mild irregularity of the right P2 segment. Anterior circulation: Both ICA siphons are patent. Left siphon supraclinoid calcified plaque is mild to moderate with no significant stenosis. Right siphon cavernous segment plaque is calcified and mild with no significant stenosis. Patent carotid termini. Patent MCA and ACA origins. Tortuous A1 segments. Diminutive or absent anterior communicating artery. Bilateral ACA branches are within normal limits. Tortuous MCA M1 segments. The left M1 and left MCA trifurcation is patent without stenosis. Right MCA M1 and bifurcation is patent without stenosis. Bilateral MCA branches are within normal limits. Branch density appears symmetric. Venous sinuses: Early contrast timing, not evaluated. Anatomic variants: None significant. Review of the MIP images confirms the above findings IMPRESSION: 1. 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 No obvious lymph node metastases. Recommend follow-up with ENT and routine Neck CT with IV contrast for staging. 2. Negative for large vessel occlusion. 3. 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 Generalized arterial tortuosity in the head and neck but only mild atherosclerosis and no significant arterial stenosis. Salient findings were communicated to Dr. Otelia Limes at 5:37 am on 08/13/2022 by text page via the [facility] messaging system. Electronically Signed   By: Odessa Fleming M.D.   On: 08/13/2022 05:38   CT HEAD CODE STROKE WO CONTRAST  Result Date: 08/13/2022 CLINICAL DATA:  Code stroke. 72 year old female with weakness and slurred speech. EXAM: CT HEAD WITHOUT CONTRAST TECHNIQUE: Contiguous axial images were obtained from the base of the skull through the vertex without intravenous contrast. RADIATION DOSE REDUCTION: This exam was performed according to the departmental dose-optimization program which includes automated exposure control, adjustment of the mA and/or kV according to patient size and/or use of iterative reconstruction technique. COMPARISON:  None Available. FINDINGS: Brain: Extensive streak artifact in the posterior fossa, bilateral middle cranial fossa appears related to hair artifact. Less pronounced similar streak in the cerebral hemispheres such as series 3, image 19. Cerebral volume is within normal limits for age. No midline shift, mass effect, or evidence of intracranial mass lesion. No ventriculomegaly. No definite acute intracranial hemorrhage  or cortically based infarct. Gray-white differentiation appears symmetric. Vascular: Limited evaluation due to streak artifact. No suspicious intracranial vascular hyperdensity. Calcified atherosclerosis at the skull base. Skull: No acute osseous abnormality identified. Sinuses/Orbits: Visualized paranasal sinuses and mastoids are clear. Other: No gaze deviation.  Negative visible scalp soft tissues. ASPECTS [facility] Stroke Program Early CT Score) Total score (0-10 with 10 being normal): 10 IMPRESSION: 1. Limited by streak artifact. 2. No acute cortically based infarct or acute intracranial hemorrhage identified. ASPECTS 10. 3. Study  discussed by telephone with Dr. Otelia Limes on 08/13/2022 at 05:10 . Electronically Signed   By: Odessa Fleming M.D.   On: 08/13/2022 05:13   CUP PACEART REMOTE DEVICE CHECK  Result Date: 08/08/2022 Scheduled remote reviewed. Normal device function.  Next remote 91 days. Hassell Halim, RN, CCDS, [employer] Scheduled remote reviewed. Normal device function.  Next remote 91 days. Hassell Halim, RN, CCDS, [employer]   ASSESSMENT & PLAN:   Assessment & Plan by Problem:  NASTASSJA BIELAT is a 72 y.o. person living with a history of HTN, T2DM, CHF, asthma, ICD placement who presented with weakness and confusion and admitted for TIA workup on hospital day 0   #Possible Transient Ischemic Attack Patient presenting with ~5 minute episode of unresponsiveness and facial asymmetry with spontaneous resolution of symptoms followed by period of generalized weakness. Code Stroke was called in the ED. Labs have thus far been within normal limits, no leukocytosis or hypoglycemia. CT Head showed no evidence of acute infarct or bleeding. CT Angio was negative for small vessel occlusion. Neuro exam in the ED was nonfocal, with possible right-sided facial weakness.  -MRI brain cannot be completed due to noncompatible pacemaker -Repeat CT Head ordered for 12 hours after initial -Echocardiogram ordered -Lipid panel, A1c pending -ASA 81 daily  -Neuro consulted, appreciate recs -Outpatient sleep study recommended for possible sleep apnea  #Hypertension Continue home amlodipine, losartan, hydrochlorothiazide   #Type 2 Diabetes Mellitus Continue home metformin  #Asthma Continue home Dulera  #Left Pharynx Tonsillar Mass Initial CT Head showed evidence of incidentally detected Left Pharynx Tonsillar Mass, 1.5 cm and suspicious for a small tonsillar carcinoma with recommendation for ENT follow-up  Diet: Normal VTE: SCDs IVF: None,   Code: Full  Prior to Admission Living Arrangement: Home, living with  husband Anticipated Discharge Location: Home Barriers to Discharge: medical stability  Dispo: Admit patient to Observation with expected length of stay less than 2 midnights.  Signed: Monna Fam, MD Internal Medicine Resident PGY-1  08/13/2022, 8:26 AM

## 2022-08-13 NOTE — Consult Note (Addendum)
NEURO HOSPITALIST CONSULT NOTE   Requesting physician: Dr. Nicanor Alcon  Reason for Consult: Weakness and slurred speech  History obtained from:  Patient, Husband and Chart     HPI:                                                                                                                                          Jessica Carpenter is an 72 y.o. female with a PMHx of arthritis, asthma, CHF, glaucoma, gout, HTN, symptomatic bradycardia, pacemaker placement and DM2 who presents to the ED via EMS as a Code Stroke after onset of slurred speech and diffuse weakness at home. LKN was 11:00 PM. Her husband was later woken up from sleep by odd breathing sounds and when he looked at her, he noted that "her mouth was twisted up". EMS was called and a Code Stroke was called in the field due to speech deficit and diffuse weakness. On arrival to the ED the patient's speech had normalized and no weakness was noted. CBG was 142. CT head was negative.   Home medications include ASA.   Past Medical History:  Diagnosis Date   Allergic rhinitis    Arthritis    "left leg" (02/04/2013)   Asthma    CHF (congestive heart failure) (HCC)    Chronic bronchitis (HCC)    "usually get it q yr" (02/04/2013)   GERD (gastroesophageal reflux disease)    Glaucoma    Gout    "used to; haven't had it lately" (02/04/2013)   Hypertension    Pacemaker    PONV (postoperative nausea and vomiting)    Symptomatic bradycardia    s/p STJ dual chamber pacemaker by Dr Ladona Ridgel 02/2013   Type II diabetes mellitus (HCC)     Past Surgical History:  Procedure Laterality Date   ABDOMINAL HYSTERECTOMY  ?1980's   BREAST BIOPSY Right    "benign"   BREAST LUMPECTOMY Right    "benign"   DILATION AND CURETTAGE OF UTERUS     GANGLION CYST EXCISION Right 2003   Ganglion of wrist, volar and distal radius   PACEMAKER INSERTION  02/04/2013   STJ Assurity dual chamber pacemaker implanted by Dr Ladona Ridgel for symptomatic bradycardia    PERMANENT PACEMAKER INSERTION N/A 02/04/2013   Procedure: PERMANENT PACEMAKER INSERTION;  Surgeon: Marinus Maw, MD;  Location: [facility] CATH LAB;  Service: Cardiovascular;  Laterality: N/A;   TUBAL LIGATION      Family History  Problem Relation Age of Onset   Breast cancer Mother    Emphysema Father    Colon polyps Neg Hx    Colon cancer Neg Hx    Esophageal cancer Neg Hx    Rectal cancer Neg Hx    Stomach cancer Neg Hx  Social History:  reports that she has never smoked. She has never used smokeless tobacco. She reports that she does not currently use alcohol. She reports that she does not use drugs.  Allergies  Allergen Reactions   Moxifloxacin Hives, Shortness Of Breath and Other (See Comments)    Caused syncope and throat tightness   Penicillins Hives, Shortness Of Breath and Other (See Comments)    Caused syncope and throat tightness Has patient had a PCN reaction causing immediate rash, facial/tongue/throat swelling, SOB or lightheadedness with hypotension: Yes Has patient had a PCN reaction causing severe rash involving mucus membranes or skin necrosis: No Has patient had a PCN reaction that required hospitalization: hospital visit but not overnight Has patient had a PCN reaction occurring within the last 10 years: No If all of the above answers are "NO", then may proceed with Cephalosporin   Arimidex [Anastrozole]     HOME MEDICATIONS:                                                                                                                      No current facility-administered medications on file prior to encounter.   Current Outpatient Medications on File Prior to Encounter  Medication Sig Dispense Refill   albuterol (VENTOLIN HFA) 108 (90 Base) MCG/ACT inhaler Inhale 1-2 puffs into the lungs every 4 (four) hours as needed for wheezing or shortness of breath. 1 each 6   amLODipine (NORVASC) 10 MG tablet Take 10 mg by mouth daily.     aspirin EC 81 MG  tablet Take 81 mg by mouth daily.     budesonide-formoterol (SYMBICORT) 80-4.5 MCG/ACT inhaler Inhale 2 puffs into the lungs in the morning and at bedtime. 1 each 12   COVID-19 mRNA bivalent vaccine, Moderna, (MODERNA COVID-19 BIVAL BOOSTER) 50 MCG/0.5ML injection Inject into the muscle. (Patient not taking: Reported on 11/10/2021) 0.5 mL 0   [employer] PURELAX 17 GM/SCOOP powder Take by mouth daily. (Patient not taking: Reported on 11/10/2021)     fexofenadine (ALLEGRA) 180 MG tablet Take 1 tablet (180 mg total) by mouth daily. 30 tablet 11   fluticasone (FLONASE) 50 MCG/ACT nasal spray Place 1 spray into both nostrils daily. 16 g 11   hydrochlorothiazide (HYDRODIURIL) 25 MG tablet Take 25 mg by mouth daily.      ipratropium (ATROVENT) 0.03 % nasal spray Place 2 sprays into both nostrils every 12 (twelve) hours. 30 mL 12   JARDIANCE 25 MG TABS tablet Take 25 mg by mouth every morning. (Patient not taking: Reported on 11/10/2021)     losartan (COZAAR) 100 MG tablet Take 100 mg by mouth daily.     lovastatin (MEVACOR) 10 MG tablet Take 1 tablet by mouth daily.     metformin (FORTAMET) 1000 MG (OSM) 24 hr tablet Take 1,000 mg by mouth 2 (two) times daily.     metFORMIN (GLUCOPHAGE) 1000 MG tablet SMARTSIG:1 Tablet(s) By Mouth Morning-Evening     montelukast (SINGULAIR) 10 MG tablet SMARTSIG:1 Tablet(s) By  Mouth Every Evening (Patient not taking: Reported on 11/10/2021) 30 tablet 11   nystatin (MYCOSTATIN) 100000 UNIT/ML suspension SMARTSIG:4 Milliliter(s) By Mouth 4 Times Daily 60 mL 6   nystatin-triamcinolone (MYCOLOG II) cream Apply to affected area daily 15 g 0   omeprazole (PRILOSEC) 20 MG capsule TAKE 1 CAPSULE (20 MG TOTAL) BY MOUTH 2 (TWO) TIMES DAILY BEFORE A MEAL. 180 capsule 3   Polyethyl Glycol-Propyl Glycol (SYSTANE OP) Place 1 drop into both eyes daily as needed (dry eyes).     Spacer/Aero-Holding Chambers (AEROCHAMBER MV) inhaler Use as instructed 1 each 0     ROS:                                                                                                                                        The patient denies any symptoms currently.   BP 124/70  General Examination:                                                                                                       Physical Exam  HEENT-  [place]/AT    Lungs- Respirations unlabored Extremities- No edema  Neurological Examination Mental Status: Alert and oriented. Thought content appropriate.  Speech fluent without evidence of aphasia.  Able to follow all commands without difficulty. Cranial Nerves: II, III,IV, VI: Fixates and tracks normally. EOMI  V: No sensory deficit  VII: Face is symmetric at rest and during speech VIII: Hearing intact to voice IX,X: No hoarseness XI: Symmetric  XII: No lingual dysarthria Motor: BUE and BLE with normal strength and no drift when held antigravity.   Sensory: No sensory deficits noted. No extinction to DSS.  Cerebellar: No ataxia noted  Gait: Able to stand and walk with own power. No gait unsteadiness.   NIHSS: 0   Lab Results: Basic Metabolic Panel: No results for input(s): "NA", "K", "CL", "CO2", "GLUCOSE", "BUN", "CREATININE", "CALCIUM", "MG", "PHOS" in the last 168 hours.  CBC: No results for input(s): "WBC", "NEUTROABS", "HGB", "HCT", "MCV", "PLT" in the last 168 hours.  Cardiac Enzymes: No results for input(s): "CKTOTAL", "CKMB", "CKMBINDEX", "TROPONINI" in the last 168 hours.  Lipid Panel: No results for input(s): "CHOL", "TRIG", "HDL", "CHOLHDL", "VLDL", "LDLCALC" in the last 168 hours.  Imaging: No results found.   Assessment: 72 year old female presenting via EMS as a Code Stroke after husband was awakened from sleep by unusual breathing sounds, noting her face to be "twisted". EMS noted diffuse weakness and  slurred speech. Symptoms had resolved by the time of arrival to the ED.  - Exam is nonfocal. NIHSS 0. - CT head: Limited by streak artifact. No acute  cortically based infarct or acute intracranial hemorrhage identified. ASPECTS 10. - CTA of head and neck: Negative for large vessel occlusion. 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 Generalized arterial tortuosity in the head and neck but only mild atherosclerosis and no significant arterial stenosis.  - Not a TNK candidate: symptoms resolved - Also noted on CTA is evidence of incidentally detected Left Pharynx Tonsillar Mass, 1.5 cm and suspicious for a small tonsillar carcinoma. No obvious lymph node metastases. Recommend follow-up with ENT and routine Neck CT with IV contrast for staging. - Nonspecific presenting symptoms of "mouth twisting" upon awakening with rapid resolution do not militate in favor of stroke or TIA. Given abnormal breathing that awakened her husband, she may have been mildly hypoxic due to snoring.    Recommendations: - MRI brain - Call Neurology if MRI brain is positive for a stroke - ENT consult for tonsillar mass is recommended by Radiology   Electronically signed: Dr. Caryl Pina 08/13/2022, 5:02 AM

## 2022-08-13 NOTE — ED Triage Notes (Signed)
Pt coming in from home husband noticed she had a facial droop and was having bilateral weakness along with slurred speech lasting about 5 min.

## 2022-08-13 NOTE — Progress Notes (Signed)
OT Cancellation Note  Patient Details Name: Jessica Carpenter MRN: [id number] DOB: Aug 01, 1950   Cancelled Treatment:    Reason Eval/Treat Not Completed: OT screened, no needs identified, will sign off (per discussion with PT, pt back at baseline with no acute  OT needs, will screen. Please reconsult if there is a change in pt status.)  Carver Fila, OTD, OTR/L SecureChat Preferred Acute Rehab [phone]   Dalphine Handing 08/13/2022, 3:51 PM

## 2022-08-13 NOTE — ED Provider Notes (Addendum)
[place] EMERGENCY DEPARTMENT AT [facility] Provider Note   CSN: [id number] Arrival date & time: 08/13/22  0501  An emergency department physician performed an initial assessment on this suspected stroke patient at 54.  History  Chief Complaint  Patient presents with   Code Stroke    Jessica Carpenter is a 72 y.o. female.  The history is provided by the EMS personnel, the patient and the spouse.  Cerebrovascular Accident This is a new problem. The current episode started 6 to 12 hours ago. The problem occurs constantly. The problem has been resolved. Pertinent negatives include no chest pain, no abdominal pain, no headaches and no shortness of breath. Nothing aggravates the symptoms. Nothing relieves the symptoms. She has tried nothing for the symptoms. The treatment provided significant relief.  Patient went to sleep and awoke with facial droop weakness and slurred speech EMS called code stroke activated.      Home Medications Prior to Admission medications   Medication Sig Start Date End Date Taking? Authorizing Provider  albuterol (VENTOLIN HFA) 108 (90 Base) MCG/ACT inhaler Inhale 1-2 puffs into the lungs every 4 (four) hours as needed for wheezing or shortness of breath. 09/27/21   Martina Sinner, MD  amLODipine (NORVASC) 10 MG tablet Take 10 mg by mouth daily.    [provider]  aspirin EC 81 MG tablet Take 81 mg by mouth daily.    [provider]  budesonide-formoterol (SYMBICORT) 80-4.5 MCG/ACT inhaler Inhale 2 puffs into the lungs in the morning and at bedtime. 09/27/21   Martina Sinner, MD  COVID-19 mRNA bivalent vaccine, Moderna, (MODERNA COVID-19 BIVAL BOOSTER) 50 MCG/0.5ML injection Inject into the muscle. Patient not taking: Reported on 11/10/2021 11/04/20   Judyann Munson, MD  [employer] PURELAX 17 GM/SCOOP powder Take by mouth daily. Patient not taking: Reported on 11/10/2021 12/02/20   [provider]  fexofenadine (ALLEGRA)  180 MG tablet Take 1 tablet (180 mg total) by mouth daily. 09/27/21   Martina Sinner, MD  fluticasone (FLONASE) 50 MCG/ACT nasal spray Place 1 spray into both nostrils daily. 09/27/21   Martina Sinner, MD  hydrochlorothiazide (HYDRODIURIL) 25 MG tablet Take 25 mg by mouth daily.     [provider]  ipratropium (ATROVENT) 0.03 % nasal spray Place 2 sprays into both nostrils every 12 (twelve) hours. 09/27/21   Martina Sinner, MD  JARDIANCE 25 MG TABS tablet Take 25 mg by mouth every morning. Patient not taking: Reported on 11/10/2021 11/05/20   [provider]  losartan (COZAAR) 100 MG tablet Take 100 mg by mouth daily.    [provider]  lovastatin (MEVACOR) 10 MG tablet Take 1 tablet by mouth daily. 03/15/20   [provider]  metformin (FORTAMET) 1000 MG (OSM) 24 hr tablet Take 1,000 mg by mouth 2 (two) times daily. 07/30/15   [provider]  metFORMIN (GLUCOPHAGE) 1000 MG tablet SMARTSIG:1 Tablet(s) By Mouth Morning-Evening 12/23/20   [provider]  montelukast (SINGULAIR) 10 MG tablet SMARTSIG:1 Tablet(s) By Mouth Every Evening Patient not taking: Reported on 11/10/2021 09/27/21   Martina Sinner, MD  nystatin (MYCOSTATIN) 100000 UNIT/ML suspension SMARTSIG:4 Milliliter(s) By Mouth 4 Times Daily 03/18/21   Martina Sinner, MD  nystatin-triamcinolone (MYCOLOG II) cream Apply to affected area daily 11/06/19   Dahlia Byes A, NP  omeprazole (PRILOSEC) 20 MG capsule TAKE 1 CAPSULE (20 MG TOTAL) BY MOUTH 2 (TWO) TIMES DAILY BEFORE A MEAL. 06/13/22   Ladona Ridgel,  Doylene Canning, MD  Polyethyl Glycol-Propyl Glycol (SYSTANE OP) Place 1 drop into both eyes daily as needed (dry eyes).    [provider]  Spacer/Aero-Holding Chambers (AEROCHAMBER MV) inhaler Use as instructed 09/27/21   Martina Sinner, MD      Allergies    Moxifloxacin, Penicillins, and Arimidex [anastrozole]    Review of Systems   Review of Systems  Unable to perform  ROS: Acuity of condition  Constitutional:  Negative for fever.  Respiratory:  Negative for shortness of breath.   Cardiovascular:  Negative for chest pain.  Gastrointestinal:  Negative for abdominal pain.  Neurological:  Positive for facial asymmetry and speech difficulty. Negative for headaches.    Physical Exam Updated Vital Signs BP (!) 142/74 (BP Location: Right Arm)   Pulse 60   Temp 97.7 F (36.5 C) (Oral)   Resp 16   SpO2 100%  Physical Exam Vitals and nursing note reviewed. Exam conducted with a chaperone present.  Constitutional:      General: She is not in acute distress.    Appearance: She is well-developed.  HENT:     Head: Normocephalic and atraumatic.     Nose: Nose normal.  Eyes:     Pupils: Pupils are equal, round, and reactive to light.  Cardiovascular:     Rate and Rhythm: Normal rate and regular rhythm.  Pulmonary:     Effort: No respiratory distress.     Breath sounds: Normal breath sounds.  Abdominal:     General: Bowel sounds are normal. There is no distension.     Palpations: Abdomen is soft.     Tenderness: There is no abdominal tenderness. There is no guarding or rebound.  Genitourinary:    Vagina: No vaginal discharge.  Musculoskeletal:        General: Normal range of motion.     Cervical back: Neck supple.  Skin:    General: Skin is warm and dry.     Capillary Refill: Capillary refill takes less than 2 seconds.     Findings: No erythema or rash.  Neurological:     General: No focal deficit present.     Mental Status: She is oriented to person, place, and time.     Cranial Nerves: No cranial nerve deficit.     Motor: No weakness.     Deep Tendon Reflexes: Reflexes normal.  Psychiatric:        Thought Content: Thought content normal.     ED Results / Procedures / Treatments   Labs (all labs ordered are listed, but only abnormal results are displayed) Results for orders placed or performed during the hospital encounter of 08/13/22   Ethanol  Result Value Ref Range   Alcohol, Ethyl (B) <10 <10 mg/dL  CBC  Result Value Ref Range   WBC 8.2 4.0 - 10.5 K/uL   RBC 4.58 3.87 - 5.11 MIL/uL   Hemoglobin 12.4 12.0 - 15.0 g/dL   HCT 95.6 38.7 - 56.4 %   MCV 87.8 80.0 - 100.0 fL   MCH 27.1 26.0 - 34.0 pg   MCHC 30.8 30.0 - 36.0 g/dL   RDW 33.2 95.1 - 88.4 %   Platelets 252 150 - 400 K/uL   nRBC 0.0 0.0 - 0.2 %  Differential  Result Value Ref Range   Neutrophils Relative % 31 %   Neutro Abs 2.5 1.7 - 7.7 K/uL   Lymphocytes Relative 59 %   Lymphs Abs 4.9 (H) 0.7 - 4.0 K/uL  Monocytes Relative 8 %   Monocytes Absolute 0.7 0.1 - 1.0 K/uL   Eosinophils Relative 1 %   Eosinophils Absolute 0.1 0.0 - 0.5 K/uL   Basophils Relative 1 %   Basophils Absolute 0.1 0.0 - 0.1 K/uL   Immature Granulocytes 0 %   Abs Immature Granulocytes 0.01 0.00 - 0.07 K/uL  Comprehensive metabolic panel  Result Value Ref Range   Sodium 136 135 - 145 mmol/L   Potassium 3.8 3.5 - 5.1 mmol/L   Chloride 103 98 - 111 mmol/L   CO2 18 (L) 22 - 32 mmol/L   Glucose, Bld 152 (H) 70 - 99 mg/dL   BUN 19 8 - 23 mg/dL   Creatinine, Ser 1.61 (H) 0.44 - 1.00 mg/dL   Calcium 8.6 (L) 8.9 - 10.3 mg/dL   Total Protein 6.7 6.5 - 8.1 g/dL   Albumin 3.4 (L) 3.5 - 5.0 g/dL   AST 16 15 - 41 U/L   ALT 13 0 - 44 U/L   Alkaline Phosphatase 85 38 - 126 U/L   Total Bilirubin 0.4 0.3 - 1.2 mg/dL   GFR, Estimated 44 (L) >60 mL/min   Anion gap 15 5 - 15  Urine rapid drug screen (hosp performed)  Result Value Ref Range   Opiates NONE DETECTED NONE DETECTED   Cocaine NONE DETECTED NONE DETECTED   Benzodiazepines NONE DETECTED NONE DETECTED   Amphetamines NONE DETECTED NONE DETECTED   Tetrahydrocannabinol NONE DETECTED NONE DETECTED   Barbiturates NONE DETECTED NONE DETECTED  Urinalysis, Routine w reflex microscopic -Urine, Clean Catch  Result Value Ref Range   Color, Urine STRAW (A) YELLOW   APPearance CLEAR CLEAR   Specific Gravity, Urine 1.019 1.005 - 1.030    pH 7.0 5.0 - 8.0   Glucose, UA NEGATIVE NEGATIVE mg/dL   Hgb urine dipstick NEGATIVE NEGATIVE   Bilirubin Urine NEGATIVE NEGATIVE   Ketones, ur NEGATIVE NEGATIVE mg/dL   Protein, ur NEGATIVE NEGATIVE mg/dL   Nitrite NEGATIVE NEGATIVE   Leukocytes,Ua SMALL (A) NEGATIVE   RBC / HPF 0-5 0 - 5 RBC/hpf   WBC, UA 0-5 0 - 5 WBC/hpf   Bacteria, UA NONE SEEN NONE SEEN   Squamous Epithelial / HPF 0-5 0 - 5 /HPF  I-stat chem 8, ED  Result Value Ref Range   Sodium 140 135 - 145 mmol/L   Potassium 3.9 3.5 - 5.1 mmol/L   Chloride 107 98 - 111 mmol/L   BUN 19 8 - 23 mg/dL   Creatinine, Ser 0.96 (H) 0.44 - 1.00 mg/dL   Glucose, Bld 045 (H) 70 - 99 mg/dL   Calcium, Ion 4.09 (L) 1.15 - 1.40 mmol/L   TCO2 18 (L) 22 - 32 mmol/L   Hemoglobin 13.3 12.0 - 15.0 g/dL   HCT 81.1 91.4 - 78.2 %  CBG monitoring, ED  Result Value Ref Range   Glucose-Capillary 142 (H) 70 - 99 mg/dL   CT ANGIO HEAD NECK W WO CM (CODE STROKE)  Result Date: 08/13/2022 CLINICAL DATA:  72 year old female code stroke presentation. EXAM: CT ANGIOGRAPHY HEAD AND NECK TECHNIQUE: Multidetector CT imaging of the head and neck was performed using the standard protocol during bolus administration of intravenous contrast. Multiplanar CT image reconstructions and MIPs were obtained to evaluate the vascular anatomy. Carotid stenosis measurements (when applicable) are obtained utilizing NASCET criteria, using the distal internal carotid diameter as the denominator. RADIATION DOSE REDUCTION: This exam was performed according to the departmental dose-optimization program which includes automated exposure  control, adjustment of the mA and/or kV according to patient size and/or use of iterative reconstruction technique. CONTRAST:  75mL OMNIPAQUE IOHEXOL 350 MG/ML SOLN COMPARISON:  Plain head CT 0505 hours today. FINDINGS: CTA NECK Skeleton: Mild for age cervical spine degeneration. No acute osseous abnormality identified. Upper chest: Left chest  pacemaker type device. Mild dependent atelectasis. Negative visible mediastinum. Other neck: Bilateral subcentimeter thyroid nodules. Not clinically significant; no follow-up imaging recommended (ref: J Am Coll Radiol. 2015 Feb;12(2): 143-50). There is asymmetric 15 mm nodular increased enhancing soft tissue at the left pharyngeal lateral wall, left tonsil series 5, image 88 and series 8, image 112. Suboptimal evaluation for cervical lymph nodes given CTA timing. No obvious malignant left-side lymphadenopathy. Right side level 2 nodes are up to 9 mm short axis. Other pharyngeal and laryngeal soft tissue contours appear negative. Aortic arch: Streak artifact from dense left innominate venous contrast. Three vessel arch. Minor arch atherosclerosis. Right carotid system: Streak artifact affecting the brachiocephalic artery and proximal right CCA which appear tortuous but patent with no convincing plaque or stenosis. Minimal soft plaque at the posterior right ICA origin. Otherwise negative right carotid bifurcation. No stenosis to the skull base. Left carotid system: Streak at the left CCA origin. Mildly tortuous proximal left CCA with no convincing plaque or stenosis. Mild soft plaque posterior left ICA origin. No stenosis to the skull base, but there is a beaded appearance of the cervical left ICA typical of Fibromuscular Dysplasia (FMD). On series 12, image 26. Contralateral cervical right ICA might also show early changes. Vertebral arteries: Proximal right subclavian artery and right vertebral artery origin are normal. Right vertebral is tortuous and patent to the skull base with no plaque or stenosis. Proximal left subclavian artery is patent with no significant stenosis. Normal left vertebral artery origin. Tortuous left V1 segment. Codominant left vertebral artery is patent to the skull base with V2 segment tortuosity but no significant plaque or stenosis. CTA HEAD Posterior circulation: Patent codominant distal  vertebral arteries and vertebrobasilar junction with no plaque or stenosis. Left AICA appears dominant and patent. Patent right PICA origin. Patent basilar artery with mild tortuosity. Patent SCA and PCA origins. Small infundibulum of the left SCA (normal variant). Tortuous left P1 segment. Posterior communicating arteries are diminutive or absent. Bilateral PCA branches are tortuous and patent. There is mild irregularity of the right P2 segment. Anterior circulation: Both ICA siphons are patent. Left siphon supraclinoid calcified plaque is mild to moderate with no significant stenosis. Right siphon cavernous segment plaque is calcified and mild with no significant stenosis. Patent carotid termini. Patent MCA and ACA origins. Tortuous A1 segments. Diminutive or absent anterior communicating artery. Bilateral ACA branches are within normal limits. Tortuous MCA M1 segments. The left M1 and left MCA trifurcation is patent without stenosis. Right MCA M1 and bifurcation is patent without stenosis. Bilateral MCA branches are within normal limits. Branch density appears symmetric. Venous sinuses: Early contrast timing, not evaluated. Anatomic variants: None significant. Review of the MIP images confirms the above findings IMPRESSION: 1. 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 No obvious lymph node metastases. Recommend follow-up with ENT and routine Neck CT with IV contrast for staging. 2. Negative for large vessel occlusion. 3. 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 Generalized arterial tortuosity in the head and neck but only mild atherosclerosis and no significant arterial stenosis. Salient findings were communicated to  Dr. Otelia Limes at 5:37 am on 08/13/2022 by text page via the [facility] messaging system. Electronically Signed   By: Odessa Fleming M.D.   On: 08/13/2022 05:38   CT HEAD CODE STROKE WO  CONTRAST  Result Date: 08/13/2022 CLINICAL DATA:  Code stroke. 72 year old female with weakness and slurred speech. EXAM: CT HEAD WITHOUT CONTRAST TECHNIQUE: Contiguous axial images were obtained from the base of the skull through the vertex without intravenous contrast. RADIATION DOSE REDUCTION: This exam was performed according to the departmental dose-optimization program which includes automated exposure control, adjustment of the mA and/or kV according to patient size and/or use of iterative reconstruction technique. COMPARISON:  None Available. FINDINGS: Brain: Extensive streak artifact in the posterior fossa, bilateral middle cranial fossa appears related to hair artifact. Less pronounced similar streak in the cerebral hemispheres such as series 3, image 19. Cerebral volume is within normal limits for age. No midline shift, mass effect, or evidence of intracranial mass lesion. No ventriculomegaly. No definite acute intracranial hemorrhage or cortically based infarct. Gray-white differentiation appears symmetric. Vascular: Limited evaluation due to streak artifact. No suspicious intracranial vascular hyperdensity. Calcified atherosclerosis at the skull base. Skull: No acute osseous abnormality identified. Sinuses/Orbits: Visualized paranasal sinuses and mastoids are clear. Other: No gaze deviation.  Negative visible scalp soft tissues. ASPECTS [facility] Stroke Program Early CT Score) Total score (0-10 with 10 being normal): 10 IMPRESSION: 1. Limited by streak artifact. 2. No acute cortically based infarct or acute intracranial hemorrhage identified. ASPECTS 10. 3. Study discussed by telephone with Dr. Otelia Limes on 08/13/2022 at 05:10 . Electronically Signed   By: Odessa Fleming M.D.   On: 08/13/2022 05:13   CUP PACEART REMOTE DEVICE CHECK  Result Date: 08/08/2022 Scheduled remote reviewed. Normal device function.  Next remote 91 days. Hassell Halim, RN, CCDS, [employer] Scheduled remote reviewed. Normal  device function.  Next remote 91 days. Hassell Halim, RN, CCDS, [employer]   Radiology CT ANGIO HEAD NECK W WO CM (CODE STROKE)  Result Date: 08/13/2022 CLINICAL DATA:  72 year old female code stroke presentation. EXAM: CT ANGIOGRAPHY HEAD AND NECK TECHNIQUE: Multidetector CT imaging of the head and neck was performed using the standard protocol during bolus administration of intravenous contrast. Multiplanar CT image reconstructions and MIPs were obtained to evaluate the vascular anatomy. Carotid stenosis measurements (when applicable) are obtained utilizing NASCET criteria, using the distal internal carotid diameter as the denominator. RADIATION DOSE REDUCTION: This exam was performed according to the departmental dose-optimization program which includes automated exposure control, adjustment of the mA and/or kV according to patient size and/or use of iterative reconstruction technique. CONTRAST:  75mL OMNIPAQUE IOHEXOL 350 MG/ML SOLN COMPARISON:  Plain head CT 0505 hours today. FINDINGS: CTA NECK Skeleton: Mild for age cervical spine degeneration. No acute osseous abnormality identified. Upper chest: Left chest pacemaker type device. Mild dependent atelectasis. Negative visible mediastinum. Other neck: Bilateral subcentimeter thyroid nodules. Not clinically significant; no follow-up imaging recommended (ref: J Am Coll Radiol. 2015 Feb;12(2): 143-50). There is asymmetric 15 mm nodular increased enhancing soft tissue at the left pharyngeal lateral wall, left tonsil series 5, image 88 and series 8, image 112. Suboptimal evaluation for cervical lymph nodes given CTA timing. No obvious malignant left-side lymphadenopathy. Right side level 2 nodes are up to 9 mm short axis. Other pharyngeal and laryngeal soft tissue contours appear negative. Aortic arch: Streak artifact from dense left innominate venous contrast. Three vessel arch. Minor arch atherosclerosis. Right carotid system: Streak artifact  affecting the brachiocephalic artery and proximal right CCA which appear tortuous but patent with no convincing plaque or stenosis. Minimal soft plaque at the posterior right ICA origin. Otherwise negative right carotid bifurcation. No stenosis to the skull base. Left carotid system: Streak at the left CCA origin. Mildly tortuous proximal left CCA with no convincing plaque or stenosis. Mild soft plaque posterior left ICA origin. No stenosis to the skull base, but there is a beaded appearance of the cervical left ICA typical of Fibromuscular Dysplasia (FMD). On series 12, image 26. Contralateral cervical right ICA might also show early changes. Vertebral arteries: Proximal right subclavian artery and right vertebral artery origin are normal. Right vertebral is tortuous and patent to the skull base with no plaque or stenosis. Proximal left subclavian artery is patent with no significant stenosis. Normal left vertebral artery origin. Tortuous left V1 segment. Codominant left vertebral artery is patent to the skull base with V2 segment tortuosity but no significant plaque or stenosis. CTA HEAD Posterior circulation: Patent codominant distal vertebral arteries and vertebrobasilar junction with no plaque or stenosis. Left AICA appears dominant and patent. Patent right PICA origin. Patent basilar artery with mild tortuosity. Patent SCA and PCA origins. Small infundibulum of the left SCA (normal variant). Tortuous left P1 segment. Posterior communicating arteries are diminutive or absent. Bilateral PCA branches are tortuous and patent. There is mild irregularity of the right P2 segment. Anterior circulation: Both ICA siphons are patent. Left siphon supraclinoid calcified plaque is mild to moderate with no significant stenosis. Right siphon cavernous segment plaque is calcified and mild with no significant stenosis. Patent carotid termini. Patent MCA and ACA origins. Tortuous A1 segments. Diminutive or absent anterior  communicating artery. Bilateral ACA branches are within normal limits. Tortuous MCA M1 segments. The left M1 and left MCA trifurcation is patent without stenosis. Right MCA M1 and bifurcation is patent without stenosis. Bilateral MCA branches are within normal limits. Branch density appears symmetric. Venous sinuses: Early contrast timing, not evaluated. Anatomic variants: None significant. Review of the MIP images confirms the above findings IMPRESSION: 1. 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 No obvious lymph node metastases. Recommend follow-up with ENT and routine Neck CT with IV contrast for staging. 2. Negative for large vessel occlusion. 3. 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 Generalized arterial tortuosity in the head and neck but only mild atherosclerosis and no significant arterial stenosis. Salient findings were communicated to Dr. Otelia Limes at 5:37 am on 08/13/2022 by text page via the [facility] messaging system. Electronically Signed   By: Odessa Fleming M.D.   On: 08/13/2022 05:38   CT HEAD CODE STROKE WO CONTRAST  Result Date: 08/13/2022 CLINICAL DATA:  Code stroke. 72 year old female with weakness and slurred speech. EXAM: CT HEAD WITHOUT CONTRAST TECHNIQUE: Contiguous axial images were obtained from the base of the skull through the vertex without intravenous contrast. RADIATION DOSE REDUCTION: This exam was performed according to the departmental dose-optimization program which includes automated exposure control, adjustment of the mA and/or kV according to patient size and/or use of iterative reconstruction technique. COMPARISON:  None Available. FINDINGS: Brain: Extensive streak artifact in the posterior fossa, bilateral middle cranial fossa appears related to hair artifact. Less pronounced similar streak in the cerebral hemispheres such as series 3, image 19. Cerebral volume  is within normal limits for age. No midline shift, mass effect, or evidence of intracranial mass lesion.  No ventriculomegaly. No definite acute intracranial hemorrhage or cortically based infarct. Gray-white differentiation appears symmetric. Vascular: Limited evaluation due to streak artifact. No suspicious intracranial vascular hyperdensity. Calcified atherosclerosis at the skull base. Skull: No acute osseous abnormality identified. Sinuses/Orbits: Visualized paranasal sinuses and mastoids are clear. Other: No gaze deviation.  Negative visible scalp soft tissues. ASPECTS [facility] Stroke Program Early CT Score) Total score (0-10 with 10 being normal): 10 IMPRESSION: 1. Limited by streak artifact. 2. No acute cortically based infarct or acute intracranial hemorrhage identified. ASPECTS 10. 3. Study discussed by telephone with Dr. Otelia Limes on 08/13/2022 at 05:10 . Electronically Signed   By: Odessa Fleming M.D.   On: 08/13/2022 05:13    Procedures Procedures    Medications Ordered in ED Medications  iohexol (OMNIPAQUE) 350 MG/ML injection 75 mL (75 mLs Intravenous Contrast Given 08/13/22 0509)    ED Course/ Medical Decision Making/ A&P                                 Medical Decision Making Amount and/or Complexity of Data Reviewed Labs: ordered.    Details: Negative UDS, alcohol level is negative. Normal white count 8.2, normal hemoglobin 12.4, platelets normal. Normal sodium 136, normal potassium 3.8, elevated creatinine 1.29. Urine is without UTI Radiology: ordered and independent interpretation performed.    Details: Negative CT head by me  ECG/medicine tests: ordered. Discussion of management or test interpretation with external provider(s): Medicine resident, will admit   Risk Prescription drug management. Decision regarding hospitalization.    Final Clinical Impression(s) / ED Diagnoses Final diagnoses:  TIA (transient ischemic attack)   The patient appears reasonably stabilized for  admission considering the current resources, flow, and capabilities available in the ED at this time, and I doubt any other [facility] requiring further screening and/or treatment in the ED prior to admission.      Gaspar Fowle, MD 08/13/22 [phone]

## 2022-08-13 NOTE — Evaluation (Signed)
Physical Therapy Evaluation Patient Details Name: Jessica Carpenter MRN: [id number] DOB: November 27, 1950 Today's Date: 08/13/2022  History of Present Illness  Pt is 72 yo female admitted on 08/13/22 with TIA.  Pt had some facial asymmetry and seemed to be "in a trance" for about 5 mins then symptoms resolved.  CT negative and pt unable to have MRI due to pacemaker.  Pt with hx including but no limited to pacemaker, breast lumpectomy (benign(), HTN, DM2, CHF, asthma  Clinical Impression  Pt admitted with above diagnosis.  Her symptoms have resolved and she was able to demonstrate safe mobility, ambulate 400', perform 5 stairs, 5/5 strength, scored 22/24 on DGI indicating low fall risk, and cognition appeared to be baseline.  Pt does not require further skilled PT services.       If plan is discharge home, recommend the following:     Can travel by private vehicle        Equipment Recommendations None recommended by PT  Recommendations for Other Services       Functional Status Assessment Patient has not had a recent decline in their functional status     Precautions / Restrictions Precautions Precautions: None      Mobility  Bed Mobility Overal bed mobility: Independent                  Transfers Overall transfer level: Independent Equipment used: None               General transfer comment: Demonstrated sit to stand safely; pt has been doing on her own    Ambulation/Gait Ambulation/Gait assistance: Supervision Gait Distance (Feet): 400 Feet Assistive device: None Gait Pattern/deviations: WFL(Within Functional Limits) Gait velocity: normal     General Gait Details: normal  Stairs Stairs: Yes Stairs assistance: Supervision Stair Management: No rails, Forwards, Alternating pattern, Step to pattern Number of Stairs: 5 General stair comments: 6" step did step to but 4" step did alternating  Wheelchair Mobility     Tilt Bed    Modified Rankin (Stroke  Patients Only)       Balance Overall balance assessment: Needs assistance Sitting-balance support: No upper extremity supported Sitting balance-Leahy Scale: Normal     Standing balance support: No upper extremity supported Standing balance-Leahy Scale: Good                   Standardized Balance Assessment Standardized Balance Assessment : Dynamic Gait Index   Dynamic Gait Index Level Surface: Normal Change in Gait Speed: Normal Gait with Horizontal Head Turns: Mild Impairment Gait with Vertical Head Turns: Mild Impairment Gait and Pivot Turn: Normal Step Over Obstacle: Normal Step Around Obstacles: Normal Steps: Normal Total Score: 22       Pertinent Vitals/Pain Pain Assessment Pain Assessment: No/denies pain    Home Living Family/patient expects to be discharged to:: Private residence Living Arrangements: Spouse/significant other Available Help at Discharge: Family Type of Home: House Home Access: Stairs to enter Entrance Stairs-Rails: None Secretary/administrator of Steps: 2   Home Layout: One level Home Equipment: None      Prior Function Prior Level of Function : Independent/Modified Independent;Driving             Mobility Comments: could ambulate in community without AD       Extremity/Trunk Assessment   Upper Extremity Assessment Upper Extremity Assessment: Overall WFL for tasks assessed (ROM WFL; MMT 5/5, coodination intact, sensation intact)    Lower Extremity Assessment Lower Extremity Assessment: Overall [facility]  for tasks assessed (ROM WFL; MMT 5/5, coodination intact, sensation intact)    Cervical / Trunk Assessment Cervical / Trunk Assessment: Normal  Communication   Communication Communication: No apparent difficulties  Cognition Arousal: Alert Behavior During Therapy: WFL for tasks assessed/performed Overall Cognitive Status: Within Functional Limits for tasks assessed                                 General  Comments: alert and oriented, follows commands, named 3 animals beginning with "c" without difficulty, aware of home meds that she hasn't had and asking about them (metformin, notified RN)        General Comments      Exercises     Assessment/Plan    PT Assessment Patient does not need any further PT services  PT Problem List         PT Treatment Interventions      PT Goals (Current goals can be found in the Care Plan section)  Acute Rehab PT Goals Patient Stated Goal: return home PT Goal Formulation: All assessment and education complete, DC therapy    Frequency       Co-evaluation               AM-PAC PT "6 Clicks" Mobility  Outcome Measure Help needed turning from your back to your side while in a flat bed without using bedrails?: None Help needed moving from lying on your back to sitting on the side of a flat bed without using bedrails?: None Help needed moving to and from a bed to a chair (including a wheelchair)?: None Help needed standing up from a chair using your arms (e.g., wheelchair or bedside chair)?: None Help needed to walk in hospital room?: None Help needed climbing 3-5 steps with a railing? : A Little 6 Click Score: 23    End of Session Equipment Utilized During Treatment: Gait belt Activity Tolerance: Patient tolerated treatment well Patient left: in bed;with call bell/phone within reach;with family/visitor present Nurse Communication: Mobility status PT Visit Diagnosis: Other abnormalities of gait and mobility (R26.89)    Time: 1610-9604 PT Time Calculation (min) (ACUTE ONLY): 14 min   Charges:   PT Evaluation $PT Eval Low Complexity: 1 Low   PT General Charges $$ ACUTE PT VISIT: 1 Visit         Anise Salvo, PT Acute Rehab Services [facility] [phone]   Rayetta Humphrey 08/13/2022, 3:41 PM

## 2022-08-13 NOTE — ED Notes (Signed)
ED TO INPATIENT HANDOFF REPORT  ED Nurse Name and Phone #: Elize Pinon [phone]  S Name/Age/Gender Jessica Carpenter 72 y.o. female Room/Bed: 037C/037C  Code Status   Code Status: Full Code  Home/SNF/Other Home Patient oriented to: self, place, time, and situation Is this baseline? Yes   Triage Complete: Triage complete  Chief Complaint TIA (transient ischemic attack) [G45.9]  Triage Note Pt coming in from home husband noticed she had a facial droop and was having bilateral weakness along with slurred speech lasting about 5 min.    Allergies Allergies  Allergen Reactions   Moxifloxacin Hives, Shortness Of Breath and Other (See Comments)    Caused syncope and throat tightness   Penicillins Hives, Shortness Of Breath and Other (See Comments)    Caused syncope and throat tightness Has patient had a PCN reaction causing immediate rash, facial/tongue/throat swelling, SOB or lightheadedness with hypotension: Yes Has patient had a PCN reaction causing severe rash involving mucus membranes or skin necrosis: No Has patient had a PCN reaction that required hospitalization: hospital visit but not overnight Has patient had a PCN reaction occurring within the last 10 years: No If all of the above answers are "NO", then may proceed with Cephalosporin   Arimidex [Anastrozole]     Level of Care/Admitting Diagnosis ED Disposition     ED Disposition  Admit   Condition  --   Comment  Hospital Area: [facility] [[id number]]  Level of Care: Telemetry Medical [104]  May place patient in observation at [facility] or [facility] if equivalent level of care is available:: No  Covid Evaluation: Asymptomatic - no recent exposure (last 10 days) testing not required  Diagnosis: TIA (transient ischemic attack) [416606]  Admitting Physician: Reymundo Poll [[id number]]  Attending Physician: Reymundo Poll [[id number]]          B Medical/Surgery History Past Medical History:   Diagnosis Date   Allergic rhinitis    Arthritis    "left leg" (02/04/2013)   Asthma    CHF (congestive heart failure) (HCC)    Chronic bronchitis (HCC)    "usually get it q yr" (02/04/2013)   GERD (gastroesophageal reflux disease)    Glaucoma    Gout    "used to; haven't had it lately" (02/04/2013)   Hypertension    Pacemaker    PONV (postoperative nausea and vomiting)    Symptomatic bradycardia    s/p STJ dual chamber pacemaker by Dr Ladona Ridgel 02/2013   Type II diabetes mellitus (HCC)    Past Surgical History:  Procedure Laterality Date   ABDOMINAL HYSTERECTOMY  ?1980's   BREAST BIOPSY Right    "benign"   BREAST LUMPECTOMY Right    "benign"   DILATION AND CURETTAGE OF UTERUS     GANGLION CYST EXCISION Right 2003   Ganglion of wrist, volar and distal radius   PACEMAKER INSERTION  02/04/2013   STJ Assurity dual chamber pacemaker implanted by Dr Ladona Ridgel for symptomatic bradycardia   PERMANENT PACEMAKER INSERTION N/A 02/04/2013   Procedure: PERMANENT PACEMAKER INSERTION;  Surgeon: Marinus Maw, MD;  Location: [facility] CATH LAB;  Service: Cardiovascular;  Laterality: N/A;   TUBAL LIGATION       A IV Location/Drains/Wounds Patient Lines/Drains/Airways Status     Active Line/Drains/Airways     Name Placement date Placement time Site Days   Peripheral IV 08/13/22 18 G Right Antecubital 08/13/22  0543  Antecubital  less than 1  Intake/Output Last 24 hours No intake or output data in the 24 hours ending 08/13/22 0834  Labs/Imaging Results for orders placed or performed during the hospital encounter of 08/13/22 (from the past 48 hour(s))  Ethanol     Status: None   Collection Time: 08/13/22  4:59 AM  Result Value Ref Range   Alcohol, Ethyl (B) <10 <10 mg/dL    Comment: (NOTE) Lowest detectable limit for serum alcohol is 10 mg/dL.  For medical purposes only. Performed at [facility], [address] [place], [place] [address]   CBC     Status: None    Collection Time: 08/13/22  4:59 AM  Result Value Ref Range   WBC 8.2 4.0 - 10.5 K/uL   RBC 4.58 3.87 - 5.11 MIL/uL   Hemoglobin 12.4 12.0 - 15.0 g/dL   HCT 21.3 08.6 - 57.8 %   MCV 87.8 80.0 - 100.0 fL   MCH 27.1 26.0 - 34.0 pg   MCHC 30.8 30.0 - 36.0 g/dL   RDW 46.9 62.9 - 52.8 %   Platelets 252 150 - 400 K/uL   nRBC 0.0 0.0 - 0.2 %    Comment: Performed at [facility], [address], [place], [place] [address]  Differential     Status: Abnormal   Collection Time: 08/13/22  4:59 AM  Result Value Ref Range   Neutrophils Relative % 31 %   Neutro Abs 2.5 1.7 - 7.7 K/uL   Lymphocytes Relative 59 %   Lymphs Abs 4.9 (H) 0.7 - 4.0 K/uL   Monocytes Relative 8 %   Monocytes Absolute 0.7 0.1 - 1.0 K/uL   Eosinophils Relative 1 %   Eosinophils Absolute 0.1 0.0 - 0.5 K/uL   Basophils Relative 1 %   Basophils Absolute 0.1 0.0 - 0.1 K/uL   Immature Granulocytes 0 %   Abs Immature Granulocytes 0.01 0.00 - 0.07 K/uL    Comment: Performed at [facility], [address] [place], [place] [address]  Comprehensive metabolic panel     Status: Abnormal   Collection Time: 08/13/22  4:59 AM  Result Value Ref Range   Sodium 136 135 - 145 mmol/L   Potassium 3.8 3.5 - 5.1 mmol/L   Chloride 103 98 - 111 mmol/L   CO2 18 (L) 22 - 32 mmol/L   Glucose, Bld 152 (H) 70 - 99 mg/dL    Comment: Glucose reference range applies only to samples taken after fasting for at least 8 hours.   BUN 19 8 - 23 mg/dL   Creatinine, Ser 7.25 (H) 0.44 - 1.00 mg/dL   Calcium 8.6 (L) 8.9 - 10.3 mg/dL   Total Protein 6.7 6.5 - 8.1 g/dL   Albumin 3.4 (L) 3.5 - 5.0 g/dL   AST 16 15 - 41 U/L   ALT 13 0 - 44 U/L   Alkaline Phosphatase 85 38 - 126 U/L   Total Bilirubin 0.4 0.3 - 1.2 mg/dL   GFR, Estimated 44 (L) >60 mL/min    Comment: (NOTE) Calculated using the CKD-EPI Creatinine Equation (2021)    Anion gap 15 5 - 15    Comment: Performed at [facility], [address], [place], [place] [address]   I-stat chem 8, ED     Status: Abnormal   Collection Time: 08/13/22  5:05 AM  Result Value Ref Range   Sodium 140 135 - 145 mmol/L   Potassium 3.9 3.5 - 5.1 mmol/L  Chloride 107 98 - 111 mmol/L   BUN 19 8 - 23 mg/dL   Creatinine, Ser 3.08 (H) 0.44 - 1.00 mg/dL   Glucose, Bld 657 (H) 70 - 99 mg/dL    Comment: Glucose reference range applies only to samples taken after fasting for at least 8 hours.   Calcium, Ion 1.13 (L) 1.15 - 1.40 mmol/L   TCO2 18 (L) 22 - 32 mmol/L   Hemoglobin 13.3 12.0 - 15.0 g/dL   HCT 84.6 96.2 - 95.2 %  CBG monitoring, ED     Status: Abnormal   Collection Time: 08/13/22  5:07 AM  Result Value Ref Range   Glucose-Capillary 142 (H) 70 - 99 mg/dL    Comment: Glucose reference range applies only to samples taken after fasting for at least 8 hours.  Urine rapid drug screen (hosp performed)     Status: None   Collection Time: 08/13/22  5:35 AM  Result Value Ref Range   Opiates NONE DETECTED NONE DETECTED   Cocaine NONE DETECTED NONE DETECTED   Benzodiazepines NONE DETECTED NONE DETECTED   Amphetamines NONE DETECTED NONE DETECTED   Tetrahydrocannabinol NONE DETECTED NONE DETECTED   Barbiturates NONE DETECTED NONE DETECTED    Comment: (NOTE) DRUG SCREEN FOR MEDICAL PURPOSES ONLY.  IF CONFIRMATION IS NEEDED FOR ANY PURPOSE, NOTIFY LAB WITHIN 5 DAYS.  LOWEST DETECTABLE LIMITS FOR URINE DRUG SCREEN Drug Class                     Cutoff (ng/mL) Amphetamine and metabolites    1000 Barbiturate and metabolites    200 Benzodiazepine                 200 Opiates and metabolites        300 Cocaine and metabolites        300 THC                            50 Performed at [facility], [address] [place], [place] [address]   Urinalysis, Routine w reflex microscopic -Urine, Clean Catch     Status: Abnormal   Collection Time: 08/13/22  5:35 AM  Result Value Ref Range   Color, Urine STRAW (A) YELLOW   APPearance CLEAR CLEAR   Specific Gravity, Urine  1.019 1.005 - 1.030   pH 7.0 5.0 - 8.0   Glucose, UA NEGATIVE NEGATIVE mg/dL   Hgb urine dipstick NEGATIVE NEGATIVE   Bilirubin Urine NEGATIVE NEGATIVE   Ketones, ur NEGATIVE NEGATIVE mg/dL   Protein, ur NEGATIVE NEGATIVE mg/dL   Nitrite NEGATIVE NEGATIVE   Leukocytes,Ua SMALL (A) NEGATIVE   RBC / HPF 0-5 0 - 5 RBC/hpf   WBC, UA 0-5 0 - 5 WBC/hpf   Bacteria, UA NONE SEEN NONE SEEN   Squamous Epithelial / HPF 0-5 0 - 5 /HPF    Comment: Performed at [facility], [address] [place], [place] [address]  Protime-INR     Status: None   Collection Time: 08/13/22  6:55 AM  Result Value Ref Range   Prothrombin Time 14.2 11.4 - 15.2 seconds   INR 1.1 0.8 - 1.2    Comment: (NOTE) INR goal varies based on device and disease states. Performed at [facility], [address], [place], [place] [address]   APTT     Status: None   Collection Time: 08/13/22  6:55 AM  Result  Value Ref Range   aPTT 25 24 - 36 seconds    Comment: Performed at [facility], [address], [place], [place] [address]   CT ANGIO HEAD NECK W WO CM (CODE STROKE)  Result Date: 08/13/2022 CLINICAL DATA:  72 year old female code stroke presentation. EXAM: CT ANGIOGRAPHY HEAD AND NECK TECHNIQUE: Multidetector CT imaging of the head and neck was performed using the standard protocol during bolus administration of intravenous contrast. Multiplanar CT image reconstructions and MIPs were obtained to evaluate the vascular anatomy. Carotid stenosis measurements (when applicable) are obtained utilizing NASCET criteria, using the distal internal carotid diameter as the denominator. RADIATION DOSE REDUCTION: This exam was performed according to the departmental dose-optimization program which includes automated exposure control, adjustment of the mA and/or kV according to patient size and/or use of iterative reconstruction technique. CONTRAST:  75mL OMNIPAQUE IOHEXOL 350 MG/ML SOLN COMPARISON:  Plain head CT 0505  hours today. FINDINGS: CTA NECK Skeleton: Mild for age cervical spine degeneration. No acute osseous abnormality identified. Upper chest: Left chest pacemaker type device. Mild dependent atelectasis. Negative visible mediastinum. Other neck: Bilateral subcentimeter thyroid nodules. Not clinically significant; no follow-up imaging recommended (ref: J Am Coll Radiol. 2015 Feb;12(2): 143-50). There is asymmetric 15 mm nodular increased enhancing soft tissue at the left pharyngeal lateral wall, left tonsil series 5, image 88 and series 8, image 112. Suboptimal evaluation for cervical lymph nodes given CTA timing. No obvious malignant left-side lymphadenopathy. Right side level 2 nodes are up to 9 mm short axis. Other pharyngeal and laryngeal soft tissue contours appear negative. Aortic arch: Streak artifact from dense left innominate venous contrast. Three vessel arch. Minor arch atherosclerosis. Right carotid system: Streak artifact affecting the brachiocephalic artery and proximal right CCA which appear tortuous but patent with no convincing plaque or stenosis. Minimal soft plaque at the posterior right ICA origin. Otherwise negative right carotid bifurcation. No stenosis to the skull base. Left carotid system: Streak at the left CCA origin. Mildly tortuous proximal left CCA with no convincing plaque or stenosis. Mild soft plaque posterior left ICA origin. No stenosis to the skull base, but there is a beaded appearance of the cervical left ICA typical of Fibromuscular Dysplasia (FMD). On series 12, image 26. Contralateral cervical right ICA might also show early changes. Vertebral arteries: Proximal right subclavian artery and right vertebral artery origin are normal. Right vertebral is tortuous and patent to the skull base with no plaque or stenosis. Proximal left subclavian artery is patent with no significant stenosis. Normal left vertebral artery origin. Tortuous left V1 segment. Codominant left vertebral artery  is patent to the skull base with V2 segment tortuosity but no significant plaque or stenosis. CTA HEAD Posterior circulation: Patent codominant distal vertebral arteries and vertebrobasilar junction with no plaque or stenosis. Left AICA appears dominant and patent. Patent right PICA origin. Patent basilar artery with mild tortuosity. Patent SCA and PCA origins. Small infundibulum of the left SCA (normal variant). Tortuous left P1 segment. Posterior communicating arteries are diminutive or absent. Bilateral PCA branches are tortuous and patent. There is mild irregularity of the right P2 segment. Anterior circulation: Both ICA siphons are patent. Left siphon supraclinoid calcified plaque is mild to moderate with no significant stenosis. Right siphon cavernous segment plaque is calcified and mild with no significant stenosis. Patent carotid termini. Patent MCA and ACA origins. Tortuous A1 segments. Diminutive or absent anterior communicating artery. Bilateral ACA branches are within normal limits. Tortuous MCA M1 segments. The left M1  and left MCA trifurcation is patent without stenosis. Right MCA M1 and bifurcation is patent without stenosis. Bilateral MCA branches are within normal limits. Branch density appears symmetric. Venous sinuses: Early contrast timing, not evaluated. Anatomic variants: None significant. Review of the MIP images confirms the above findings IMPRESSION: 1. 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 No obvious lymph node metastases. Recommend follow-up with ENT and routine Neck CT with IV contrast for staging. 2. Negative for large vessel occlusion. 3. 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 Generalized arterial tortuosity in the head and neck but only mild atherosclerosis and no significant arterial stenosis. Salient findings were communicated to Dr. Otelia Limes at 5:37 am on  08/13/2022 by text page via the [facility] messaging system. Electronically Signed   By: Odessa Fleming M.D.   On: 08/13/2022 05:38   CT HEAD CODE STROKE WO CONTRAST  Result Date: 08/13/2022 CLINICAL DATA:  Code stroke. 72 year old female with weakness and slurred speech. EXAM: CT HEAD WITHOUT CONTRAST TECHNIQUE: Contiguous axial images were obtained from the base of the skull through the vertex without intravenous contrast. RADIATION DOSE REDUCTION: This exam was performed according to the departmental dose-optimization program which includes automated exposure control, adjustment of the mA and/or kV according to patient size and/or use of iterative reconstruction technique. COMPARISON:  None Available. FINDINGS: Brain: Extensive streak artifact in the posterior fossa, bilateral middle cranial fossa appears related to hair artifact. Less pronounced similar streak in the cerebral hemispheres such as series 3, image 19. Cerebral volume is within normal limits for age. No midline shift, mass effect, or evidence of intracranial mass lesion. No ventriculomegaly. No definite acute intracranial hemorrhage or cortically based infarct. Gray-white differentiation appears symmetric. Vascular: Limited evaluation due to streak artifact. No suspicious intracranial vascular hyperdensity. Calcified atherosclerosis at the skull base. Skull: No acute osseous abnormality identified. Sinuses/Orbits: Visualized paranasal sinuses and mastoids are clear. Other: No gaze deviation.  Negative visible scalp soft tissues. ASPECTS [facility] Stroke Program Early CT Score) Total score (0-10 with 10 being normal): 10 IMPRESSION: 1. Limited by streak artifact. 2. No acute cortically based infarct or acute intracranial hemorrhage identified. ASPECTS 10. 3. Study discussed by telephone with Dr. Otelia Limes on 08/13/2022 at 05:10 . Electronically Signed   By: Odessa Fleming M.D.   On: 08/13/2022 05:13    Pending Labs Unresulted Labs (From admission, onward)     Start      Ordered   08/14/22 0500  Basic metabolic panel  Tomorrow morning,   R        08/13/22 0828   08/14/22 0500  CBC  Tomorrow morning,   R        08/13/22 0828   08/13/22 0831  Lipid panel  Once,   R        08/13/22 0831   08/13/22 0831  Hemoglobin A1c  Once,   R        08/13/22 0831            Vitals/Pain Today's Vitals   08/13/22 0630 08/13/22 0632 08/13/22 0645 08/13/22 0824  BP: (!) 142/74 (!) 142/74 139/86   Pulse: 65 60 66   Resp:  16 16   Temp:  97.7 F (36.5 C)    TempSrc:  Oral    SpO2: 100% 100% 100%   PainSc:    0-No pain    Isolation Precautions No active isolations  Medications  Medications  amLODipine (NORVASC) tablet 10 mg (has no administration in time range)  mometasone-formoterol (DULERA) 100-5 MCG/ACT inhaler 2 puff (has no administration in time range)  hydrochlorothiazide (HYDRODIURIL) tablet 25 mg (has no administration in time range)  losartan (COZAAR) tablet 100 mg (has no administration in time range)  metFORMIN (GLUCOPHAGE-XR) 24 hr tablet 1,000 mg (has no administration in time range)  iohexol (OMNIPAQUE) 350 MG/ML injection 75 mL (75 mLs Intravenous Contrast Given 08/13/22 0509)    Mobility walks     Focused Assessments Neuro Assessment Handoff:  Swallow screen pass? Yes  Cardiac Rhythm: Normal sinus rhythm NIH Stroke Scale  Dizziness Present: No Headache Present: No Interval: Shift assessment Level of Consciousness (1a.)   : Alert, keenly responsive LOC Questions (1b. )   : Answers both questions correctly LOC Commands (1c. )   : Performs both tasks correctly Best Gaze (2. )  : Normal Visual (3. )  : No visual loss Facial Palsy (4. )    : Normal symmetrical movements Motor Arm, Left (5a. )   : No drift Motor Arm, Right (5b. ) : No drift Motor Leg, Left (6a. )  : No drift Motor Leg, Right (6b. ) : No drift Limb Ataxia (7. ): Absent Sensory (8. )  : Normal, no sensory loss Best Language (9. )  : No aphasia Dysarthria (10. ):  Normal Extinction/Inattention (11.)   : No Abnormality Complete NIHSS TOTAL: 0 Last date known well: 08/12/22 Last time known well: 2300 Neuro Assessment: Within Defined Limits Neuro Checks:   Initial (08/13/22 0500)  Has TPA been given? No If patient is a Neuro Trauma and patient is going to OR before floor call report to 4N Charge nurse: [phone] or [phone]   R Recommendations: See Admitting Provider Note  Report given to:   Additional Notes: none

## 2022-08-13 NOTE — Plan of Care (Signed)

## 2022-08-13 NOTE — Code Documentation (Signed)
Responded to Code Stroke called at 0454 for weakness and slurred speech, [id number]. Pt arrived at 0501, CBG-142, NIH-0, CT head negative. TNK not given-symptoms resolved. CTA-no LVO. Pt placed on TIA alert: VS/neuro checks q2h x 12, then q4h.

## 2022-08-14 ENCOUNTER — Observation Stay (HOSPITAL_COMMUNITY): Payer: Medicare Other

## 2022-08-14 ENCOUNTER — Observation Stay (HOSPITAL_BASED_OUTPATIENT_CLINIC_OR_DEPARTMENT_OTHER): Payer: Medicare Other

## 2022-08-14 DIAGNOSIS — G459 Transient cerebral ischemic attack, unspecified: Secondary | ICD-10-CM

## 2022-08-14 DIAGNOSIS — R29818 Other symptoms and signs involving the nervous system: Secondary | ICD-10-CM | POA: Diagnosis not present

## 2022-08-14 LAB — CBC
HCT: 40.1 % (ref 36.0–46.0)
Hemoglobin: 12.8 g/dL (ref 12.0–15.0)
MCH: 27.8 pg (ref 26.0–34.0)
MCHC: 31.9 g/dL (ref 30.0–36.0)
MCV: 87.2 fL (ref 80.0–100.0)
Platelets: 263 10*3/uL (ref 150–400)
RBC: 4.6 MIL/uL (ref 3.87–5.11)
RDW: 14.1 % (ref 11.5–15.5)
WBC: 7.4 10*3/uL (ref 4.0–10.5)
nRBC: 0 % (ref 0.0–0.2)

## 2022-08-14 LAB — BASIC METABOLIC PANEL WITH GFR
Anion gap: 10 (ref 5–15)
BUN: 18 mg/dL (ref 8–23)
CO2: 25 mmol/L (ref 22–32)
Calcium: 8.9 mg/dL (ref 8.9–10.3)
Chloride: 103 mmol/L (ref 98–111)
Creatinine, Ser: 1.32 mg/dL — ABNORMAL HIGH (ref 0.44–1.00)
GFR, Estimated: 43 mL/min — ABNORMAL LOW (ref >60)
Glucose, Bld: 105 mg/dL — ABNORMAL HIGH (ref 70–99)
Potassium: 3.6 mmol/L (ref 3.5–5.1)
Sodium: 138 mmol/L (ref 135–145)

## 2022-08-14 LAB — ECHOCARDIOGRAM COMPLETE
AR max vel: 1.74 cm2
AV Peak grad: 5.5 mmHg
Ao pk vel: 1.17 m/s
Area-P 1/2: 3.31 cm2
S' Lateral: 2.2 cm

## 2022-08-14 MED ORDER — LACTATED RINGERS IV BOLUS
500.0000 mL | Freq: Once | INTRAVENOUS | Status: AC
  Administered 2022-08-14: 500 mL via INTRAVENOUS

## 2022-08-14 MED ORDER — CLOPIDOGREL BISULFATE 75 MG PO TABS: 75.0000 mg | ORAL_TABLET | Freq: Every day | ORAL | 0 refills | Status: AC

## 2022-08-14 MED ORDER — IOHEXOL 350 MG/ML SOLN
50.0000 mL | Freq: Once | INTRAVENOUS | Status: AC | PRN
  Administered 2022-08-14: 50 mL via INTRAVENOUS

## 2022-08-14 NOTE — Plan of Care (Signed)
  Problem: Education: Goal: Knowledge of General Education information will improve Description Including pain rating scale, medication(s)/side effects and non-pharmacologic comfort measures Outcome: Progressing   

## 2022-08-14 NOTE — Progress Notes (Signed)
Echocardiogram 2D Echocardiogram has been performed.  Irish Lack, RDCS 08/14/2022, 8:58 AM

## 2022-08-14 NOTE — Discharge Instructions (Addendum)
You were hospitalized for confusion and facial drooping. CT imaging of your head showed you were not having a stroke. As we discussed, your CT also showed a mass in your tonsil that could be tonsillar carcinoma. It will be very important to have an appointment with an ENT specialist to have this evaluated. Your bloodwork has all been within normal limits. At this time, I feel you are stable for discharge with outpatient follow-up. Thank you for allowing [place] to be part of your care.   Please follow up with your PCP within 1-2 weeks. This is very important as they will make further appointments with an ear nose and throat specialist regarding the mass in your tonsil.   Please note these changes made to your medications:   *Please START taking:  Clopidogrel (Plavix) for 3 weeks  Please make sure to return to the hospital if you have another episode of weakness, confusion, facial drooping.   Please call our clinic if you have any questions or concerns, we may be able to help and keep you from a long and expensive emergency room wait. Our clinic and after hours phone number is [phone], the best time to call is Monday through Friday 9 am to 4 pm but there is always someone available 24/7 if you have an emergency. If you need medication refills please notify your pharmacy one week in advance and they will send [place] a request.

## 2022-08-14 NOTE — Discharge Summary (Signed)
Name: Jessica Carpenter MRN: [id number] DOB: 1950-12-09 72 y.o. PCP: Medicine, [facility]  Date of Admission: 08/13/2022  5:01 AM Date of Discharge: 08/14/22  Attending Physician: Dr. Criselda Peaches  Discharge Diagnosis: Principal Problem:   TIA (transient ischemic attack)    Discharge Medications: Allergies as of 08/14/2022       Reactions   Moxifloxacin Hives, Shortness Of Breath, Other (See Comments)   Caused syncope and throat tightness   Penicillins Hives, Shortness Of Breath, Other (See Comments)   Caused syncope and throat tightness Has patient had a PCN reaction causing immediate rash, facial/tongue/throat swelling, SOB or lightheadedness with hypotension: Yes Has patient had a PCN reaction causing severe rash involving mucus membranes or skin necrosis: No Has patient had a PCN reaction that required hospitalization: hospital visit but not overnight Has patient had a PCN reaction occurring within the last 10 years: No If all of the above answers are "NO", then may proceed with Cephalosporin   Arimidex [anastrozole]         Medication List     TAKE these medications    AeroChamber MV inhaler Use as instructed   albuterol 108 (90 Base) MCG/ACT inhaler Commonly known as: VENTOLIN HFA Inhale 1-2 puffs into the lungs every 4 (four) hours as needed for wheezing or shortness of breath.   amLODipine 10 MG tablet Commonly known as: NORVASC Take 10 mg by mouth daily.   aspirin EC 81 MG tablet Take 81 mg by mouth daily.   budesonide-formoterol 80-4.5 MCG/ACT inhaler Commonly known as: Symbicort Inhale 2 puffs into the lungs in the morning and at bedtime.   clopidogrel 75 MG tablet Commonly known as: Plavix Take 1 tablet (75 mg total) by mouth daily for 21 days.   fexofenadine 180 MG tablet Commonly known as: ALLEGRA Take 1 tablet (180 mg total) by mouth daily.   fluticasone 50 MCG/ACT nasal spray Commonly known as: FLONASE Place 1 spray into both  nostrils daily.   hydrochlorothiazide 25 MG tablet Commonly known as: HYDRODIURIL Take 25 mg by mouth daily.   hydroquinone 4 % cream Apply 1 g topically 2 (two) times daily.   ipratropium 0.03 % nasal spray Commonly known as: ATROVENT Place 2 sprays into both nostrils every 12 (twelve) hours.   ketoconazole 2 % cream Commonly known as: NIZORAL Apply 1 Application topically daily.   ketoconazole 2 % shampoo Commonly known as: NIZORAL Apply 1 Application topically daily as needed for irritation.   latanoprost 0.005 % ophthalmic solution Commonly known as: XALATAN Place 1 drop into both eyes at bedtime.   losartan 100 MG tablet Commonly known as: COZAAR Take 100 mg by mouth daily.   lovastatin 10 MG tablet Commonly known as: MEVACOR Take 1 tablet by mouth daily.   metFORMIN 1000 MG tablet Commonly known as: GLUCOPHAGE Take 1,000 mg by mouth daily.   nystatin 100000 UNIT/ML suspension Commonly known as: MYCOSTATIN SMARTSIG:4 Milliliter(s) By Mouth 4 Times Daily   nystatin-triamcinolone cream Commonly known as: MYCOLOG II Apply to affected area daily   omeprazole 20 MG capsule Commonly known as: PRILOSEC TAKE 1 CAPSULE (20 MG TOTAL) BY MOUTH 2 (TWO) TIMES DAILY BEFORE A MEAL. What changed: when to take this   SYSTANE OP Place 1 drop into both eyes daily as needed (dry eyes).        Disposition and follow-up:   Jessica Carpenter was discharged from [facility] in Stable condition.  At the hospital follow up  visit please address:  1.  Follow-up:  a. Possible TIA: check for further episodes of TIA like symptoms    b. Sleep apnea: patient's presentation could be due to undiagnosed sleep apnea, in setting of new tonsillar mass. 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 Repeat CT neck with contrast confirms presence of 2cm mass  without cervical lymphadenopathy. Please refer for follow-up with ENT.  2.  Labs / imaging needed at time of follow-up: none  3.  Pending labs/ test needing follow-up: none  4.  Medication Changes  Started: Plavix for 21 days  Follow-up Appointments: per PCP   Hospital Course by problem list:   #Possible Transient Ischemic Attack #Sleep Apnea Patient presenting with ~5 minute episode of unresponsiveness and facial asymmetry with spontaneous resolution of symptoms followed by period of generalized weakness. Code Stroke was called in the ED. Labs have thus far have been within normal limits, no leukocytosis or hypoglycemia. CT Head and 12 hour follow up scan showed no evidence of acute infarct or bleeding. CT Angio was negative for small vessel occlusion. Neuro exam in the ED was nonfocal, with possible right-sided facial weakness. Neurology team felt acute stroke was extremely unlikely, and possibly-undiagnosed sleep apnea was a more likely explanation for patient's presentation. Recommended sleep study for evaluation. Patient stable for discharge with 21 days of Plavix and continuation of home ASA  #Left Pharynx Tonsillar Mass Initial CT Head showed evidence of incidentally detected Left Pharynx Tonsillar Mass,1.5 cm and suspicious for a small tonsillar carcinoma with recommendation for ENT follow-up.   #Hypertension Continued home amlodipine, losartan, hydrochlorothiazide       #Type 2 Diabetes Mellitus Continued home metformin   #Asthma Continued home Dulera   #Elevated Creatinine Patient's creatinine was elevated to 1.29 at admission, 1.32 at time of discharge. Unknown patient's baseline as she has not had labwork for several years previous to admission.   Discharge Subjective: Saw patient at bedside this AM. Patient reports she was feeling great and was ready to go home. Went over results of CT including finding of tonsillar mass and strongly recommended follow up. Neuro exam  unchanged from admission with no acute findings. Patient was pleased to be discharged today.   Discharge Exam:   BP 104/72 (BP Location: Right Arm)   Pulse (!) 59   Temp 97.6 F (36.4 C) (Oral)   Resp 18   SpO2 100%  Constitutional: well-appearing, walking around room in no acute distress HENT: normocephalic atraumatic, mucous membranes moist Eyes: conjunctiva non-erythematous Neck: supple Cardiovascular: regular rate and rhythm, no m/r/g Pulmonary/Chest: normal work of breathing on room air, lungs clear to auscultation bilaterally Abdominal: soft, non-tender, non-distended MSK: normal bulk and tone Neurological: alert & oriented x 3, 5/5 strength in bilateral upper and lower extremities, normal gait Skin: warm and dry  Pertinent Labs, Studies, and Procedures:     Latest Ref Rng & Units 08/14/2022    2:43 AM 08/13/2022    5:05 AM 08/13/2022    4:59 AM  CBC  WBC 4.0 - 10.5 K/uL 7.4   8.2   Hemoglobin 12.0 - 15.0 g/dL 95.2  84.1  32.4   Hematocrit 36.0 - 46.0 % 40.1  39.0  40.2   Platelets 150 - 400 K/uL 263   252        Latest Ref Rng & Units 08/14/2022    2:43 AM 08/13/2022  5:05 AM 08/13/2022    4:59 AM  CMP  Glucose 70 - 99 mg/dL 657  846  962   BUN 8 - 23 mg/dL 18  19  19    Creatinine 0.44 - 1.00 mg/dL 9.52  8.41  3.24   Sodium 135 - 145 mmol/L 138  140  136   Potassium 3.5 - 5.1 mmol/L 3.6  3.9  3.8   Chloride 98 - 111 mmol/L 103  107  103   CO2 22 - 32 mmol/L 25   18   Calcium 8.9 - 10.3 mg/dL 8.9   8.6   Total Protein 6.5 - 8.1 g/dL   6.7   Total Bilirubin 0.3 - 1.2 mg/dL   0.4   Alkaline Phos 38 - 126 U/L   85   AST 15 - 41 U/L   16   ALT 0 - 44 U/L   13     CT Head Wo Contrast  Result Date: 08/13/2022 CLINICAL DATA:  Transient ischemic attack EXAM: CT HEAD WITHOUT CONTRAST TECHNIQUE: Contiguous axial images were obtained from the base of the skull through the vertex without intravenous contrast. RADIATION DOSE REDUCTION: This exam was performed according to  the departmental dose-optimization program which includes automated exposure control, adjustment of the mA and/or kV according to patient size and/or use of iterative reconstruction technique. COMPARISON:  08/13/2022 FINDINGS: Brain: There is no mass, hemorrhage or extra-axial collection. The size and configuration of the ventricles and extra-axial CSF spaces are normal. The brain parenchyma is normal, without acute or chronic infarction. Vascular: No abnormal hyperdensity of the major intracranial arteries or dural venous sinuses. No intracranial atherosclerosis. Skull: The visualized skull base, calvarium and extracranial soft tissues are normal. Sinuses/Orbits: No fluid levels or advanced mucosal thickening of the visualized paranasal sinuses. No mastoid or middle ear effusion. The orbits are normal. IMPRESSION: Normal head CT. Electronically Signed   By: Deatra Robinson M.D.   On: 08/13/2022 21:00   CT ANGIO HEAD NECK W WO CM (CODE STROKE)  Result Date: 08/13/2022 CLINICAL DATA:  72 year old female code stroke presentation. EXAM: CT ANGIOGRAPHY HEAD AND NECK TECHNIQUE: Multidetector CT imaging of the head and neck was performed using the standard protocol during bolus administration of intravenous contrast. Multiplanar CT image reconstructions and MIPs were obtained to evaluate the vascular anatomy. Carotid stenosis measurements (when applicable) are obtained utilizing NASCET criteria, using the distal internal carotid diameter as the denominator. RADIATION DOSE REDUCTION: This exam was performed according to the departmental dose-optimization program which includes automated exposure control, adjustment of the mA and/or kV according to patient size and/or use of iterative reconstruction technique. CONTRAST:  75mL OMNIPAQUE IOHEXOL 350 MG/ML SOLN COMPARISON:  Plain head CT 0505 hours today. FINDINGS: CTA NECK Skeleton: Mild for age cervical spine degeneration. No acute osseous abnormality identified. Upper  chest: Left chest pacemaker type device. Mild dependent atelectasis. Negative visible mediastinum. Other neck: Bilateral subcentimeter thyroid nodules. Not clinically significant; no follow-up imaging recommended (ref: J Am Coll Radiol. 2015 Feb;12(2): 143-50). There is asymmetric 15 mm nodular increased enhancing soft tissue at the left pharyngeal lateral wall, left tonsil series 5, image 88 and series 8, image 112. Suboptimal evaluation for cervical lymph nodes given CTA timing. No obvious malignant left-side lymphadenopathy. Right side level 2 nodes are up to 9 mm short axis. Other pharyngeal and laryngeal soft tissue contours appear negative. Aortic arch: Streak artifact from dense left innominate venous contrast. Three vessel arch. Minor arch atherosclerosis. Right carotid system:  Streak artifact affecting the brachiocephalic artery and proximal right CCA which appear tortuous but patent with no convincing plaque or stenosis. Minimal soft plaque at the posterior right ICA origin. Otherwise negative right carotid bifurcation. No stenosis to the skull base. Left carotid system: Streak at the left CCA origin. Mildly tortuous proximal left CCA with no convincing plaque or stenosis. Mild soft plaque posterior left ICA origin. No stenosis to the skull base, but there is a beaded appearance of the cervical left ICA typical of Fibromuscular Dysplasia (FMD). On series 12, image 26. Contralateral cervical right ICA might also show early changes. Vertebral arteries: Proximal right subclavian artery and right vertebral artery origin are normal. Right vertebral is tortuous and patent to the skull base with no plaque or stenosis. Proximal left subclavian artery is patent with no significant stenosis. Normal left vertebral artery origin. Tortuous left V1 segment. Codominant left vertebral artery is patent to the skull base with V2 segment tortuosity but no significant plaque or stenosis. CTA HEAD Posterior circulation: Patent  codominant distal vertebral arteries and vertebrobasilar junction with no plaque or stenosis. Left AICA appears dominant and patent. Patent right PICA origin. Patent basilar artery with mild tortuosity. Patent SCA and PCA origins. Small infundibulum of the left SCA (normal variant). Tortuous left P1 segment. Posterior communicating arteries are diminutive or absent. Bilateral PCA branches are tortuous and patent. There is mild irregularity of the right P2 segment. Anterior circulation: Both ICA siphons are patent. Left siphon supraclinoid calcified plaque is mild to moderate with no significant stenosis. Right siphon cavernous segment plaque is calcified and mild with no significant stenosis. Patent carotid termini. Patent MCA and ACA origins. Tortuous A1 segments. Diminutive or absent anterior communicating artery. Bilateral ACA branches are within normal limits. Tortuous MCA M1 segments. The left M1 and left MCA trifurcation is patent without stenosis. Right MCA M1 and bifurcation is patent without stenosis. Bilateral MCA branches are within normal limits. Branch density appears symmetric. Venous sinuses: Early contrast timing, not evaluated. Anatomic variants: None significant. Review of the MIP images confirms the above findings IMPRESSION: 1. 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 No obvious lymph node metastases. Recommend follow-up with ENT and routine Neck CT with IV contrast for staging. 2. Negative for large vessel occlusion. 3. 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 Generalized arterial tortuosity in the head and neck but only mild atherosclerosis and no significant arterial stenosis. Salient findings were communicated to Dr. Otelia Limes at 5:37 am on 08/13/2022 by text page via the [facility] messaging system. Electronically Signed   By: Odessa Fleming M.D.   On: 08/13/2022 05:38   CT HEAD  CODE STROKE WO CONTRAST  Result Date: 08/13/2022 CLINICAL DATA:  Code stroke. 72 year old female with weakness and slurred speech. EXAM: CT HEAD WITHOUT CONTRAST TECHNIQUE: Contiguous axial images were obtained from the base of the skull through the vertex without intravenous contrast. RADIATION DOSE REDUCTION: This exam was performed according to the departmental dose-optimization program which includes automated exposure control, adjustment of the mA and/or kV according to patient size and/or use of iterative reconstruction technique. COMPARISON:  None Available. FINDINGS: Brain: Extensive streak artifact in the posterior fossa, bilateral middle cranial fossa appears related to hair artifact. Less pronounced similar streak in the cerebral hemispheres such as series 3, image 19. Cerebral volume is within normal limits for age. No midline shift, mass effect, or evidence of intracranial  mass lesion. No ventriculomegaly. No definite acute intracranial hemorrhage or cortically based infarct. Gray-white differentiation appears symmetric. Vascular: Limited evaluation due to streak artifact. No suspicious intracranial vascular hyperdensity. Calcified atherosclerosis at the skull base. Skull: No acute osseous abnormality identified. Sinuses/Orbits: Visualized paranasal sinuses and mastoids are clear. Other: No gaze deviation.  Negative visible scalp soft tissues. ASPECTS [facility] Stroke Program Early CT Score) Total score (0-10 with 10 being normal): 10 IMPRESSION: 1. Limited by streak artifact. 2. No acute cortically based infarct or acute intracranial hemorrhage identified. ASPECTS 10. 3. Study discussed by telephone with Dr. Otelia Limes on 08/13/2022 at 05:10 . Electronically Signed   By: Odessa Fleming M.D.   On: 08/13/2022 05:13     Signed: Monna Fam, MD PGY-1 08/14/2022, 8:54 AM   Pager: [phone]

## 2022-08-23 NOTE — Progress Notes (Signed)
Remote pacemaker transmission.   

## 2022-09-11 ENCOUNTER — Other Ambulatory Visit: Payer: Self-pay | Admitting: Pulmonary Disease

## 2022-09-11 DIAGNOSIS — J4530 Mild persistent asthma, uncomplicated: Secondary | ICD-10-CM

## 2022-10-06 ENCOUNTER — Telehealth: Payer: Self-pay | Admitting: Internal Medicine

## 2022-10-06 NOTE — Telephone Encounter (Signed)
Left message to call back tomorrow to schedule a tele pre op appt.

## 2022-10-06 NOTE — Telephone Encounter (Signed)
   Name: Jessica Carpenter  DOB: 1950-03-11  MRN: [id number]  Primary Cardiologist: None   Preoperative team, please contact this patient and set up a phone call appointment for further preoperative risk assessment. Please obtain consent and complete medication review. Thank you for your help. Last seen by Dr. Sharrell Ku on 04/28/2022  I confirm that guidance regarding antiplatelet and oral anticoagulation therapy has been completed and, if necessary, noted below.  Per office protocol, if patient is without any new symptoms or concerns at the time of their virtual visit, he/she may hold ASA for 7 days prior to procedure. Please resume ASA as soon as possible postprocedure, at the discretion of the surgeon.    Jardiance should be held 72 hours prior to general anesthesia.   I also confirmed the patient resides in the state of [place]. As per [facility] Medical Board telemedicine laws, the patient must reside in the state in which the provider is licensed.   [name], NP 10/06/2022, 4:32 PM [facility]

## 2022-10-06 NOTE — Telephone Encounter (Signed)
   Pre-operative Risk Assessment    Patient Name: Jessica Carpenter  DOB: December 19, 1950 MRN: [id number]      Request for Surgical Clearance    Procedure:   Direct Laryngoscopy with Biopsy   Date of Surgery:  Clearance 10/19/22                                 Surgeon: Dr. Christia Reading Surgeon's Group or Practice Name: [facility] Phone number: [phone] Fax number: [phone]   Type of Clearance Requested:   - Medical    Type of Anesthesia:  General    Additional requests/questions:  Please advise surgeon/provider what medications should be held.  Barbette Reichmann   10/06/2022, 4:18 PM

## 2022-10-10 ENCOUNTER — Telehealth: Payer: Self-pay | Admitting: *Deleted

## 2022-10-10 NOTE — Telephone Encounter (Signed)
Pt has been added on ok per [name], DNP due to procedure date and med hold. Med rec and consent are done.

## 2022-10-10 NOTE — Telephone Encounter (Signed)
Pt has been added on ok per [name], DNP due to procedure date and med hold. Med rec and consent are done.     Patient Consent for Virtual Visit        Jessica Carpenter has provided verbal consent on 10/10/2022 for a virtual visit (video or telephone).   CONSENT FOR VIRTUAL VISIT FOR:  Jessica Carpenter  By participating in this virtual visit I agree to the following:  I hereby voluntarily request, consent and authorize [facility] and its employed or contracted physicians, physician assistants, nurse practitioners or other licensed health care professionals (the Practitioner), to provide me with telemedicine health care services (the "Services") as deemed necessary by the treating Practitioner. I acknowledge and consent to receive the Services by the Practitioner via telemedicine. I understand that the telemedicine visit will involve communicating with the Practitioner through live audiovisual communication technology and the disclosure of certain medical information by electronic transmission. I acknowledge that I have been given the opportunity to request an in-person assessment or other available alternative prior to the telemedicine visit and am voluntarily participating in the telemedicine visit.  I understand that I have the right to withhold or withdraw my consent to the use of telemedicine in the course of my care at any time, without affecting my right to future care or treatment, and that the Practitioner or I may terminate the telemedicine visit at any time. I understand that I have the right to inspect all information obtained and/or recorded in the course of the telemedicine visit and may receive copies of available information for a reasonable fee.  I understand that some of the potential risks of receiving the Services via telemedicine include:  Delay or interruption in medical evaluation due to technological equipment failure or disruption; Information transmitted  may not be sufficient (e.g. poor resolution of images) to allow for appropriate medical decision making by the Practitioner; and/or  In rare instances, security protocols could fail, causing a breach of personal health information.  Furthermore, I acknowledge that it is my responsibility to provide information about my medical history, conditions and care that is complete and accurate to the best of my ability. I acknowledge that Practitioner's advice, recommendations, and/or decision may be based on factors not within their control, such as incomplete or inaccurate data provided by me or distortions of diagnostic images or specimens that may result from electronic transmissions. I understand that the practice of medicine is not an exact science and that Practitioner makes no warranties or guarantees regarding treatment outcomes. I acknowledge that a copy of this consent can be made available to me via my patient portal [facility] MyChart), or I can request a printed copy by calling the office of [facility].    I understand that my insurance will be billed for this visit.   I have read or had this consent read to me. I understand the contents of this consent, which adequately explains the benefits and risks of the Services being provided via telemedicine.  I have been provided ample opportunity to ask questions regarding this consent and the Services and have had my questions answered to my satisfaction. I give my informed consent for the services to be provided through the use of telemedicine in my medical care

## 2022-10-12 ENCOUNTER — Ambulatory Visit: Payer: Medicare Other | Attending: Cardiovascular Disease | Admitting: Nurse Practitioner

## 2022-10-12 DIAGNOSIS — Z01810 Encounter for preprocedural cardiovascular examination: Secondary | ICD-10-CM

## 2022-10-12 NOTE — Progress Notes (Signed)
Virtual Visit via Telephone Note   Because of Jessica Carpenter's co-morbid illnesses, she is at least at moderate risk for complications without adequate follow up.  This format is felt to be most appropriate for this patient at this time.  The patient did not have access to video technology/had technical difficulties with video requiring transitioning to audio format only (telephone).  All issues noted in this document were discussed and addressed.  No physical exam could be performed with this format.  Please refer to the patient's chart for her consent to telehealth for [facility].  Evaluation Performed:  Preoperative cardiovascular risk assessment _____________   Date:  10/12/2022   Patient ID:  Jessica Carpenter, DOB 10/22/1950, MRN [id number] Patient Location:  Home Provider location:   Office  Primary Care Provider:  Medicine, [facility] Primary Cardiologist:  None  Chief Complaint / Patient Profile   72 y.o. y/o female with a h/o CHB s/p PPM, hypertension, TIA, prediabetes and obesity who is pending  Direct Laryngoscopy with Biopsy on 10/19/2022 with Dr. Christia Reading of [facility] and presents today for telephonic preoperative cardiovascular risk assessment.  History of Present Illness    Jessica Carpenter is a 72 y.o. female who presents via audio/video conferencing for a telehealth visit today.  Pt was last seen in cardiology clinic on 04/28/2022 by Dr. Ladona Ridgel.  At that time Jessica Carpenter was doing well.  The patient is now pending procedure as outlined above. Since her last visit, she has done well from a cardiac standpoint.   She denies chest pain, palpitations, dyspnea, pnd, orthopnea, n, v, dizziness, syncope, edema, weight gain, or early satiety. All other systems reviewed and are otherwise negative except as noted above.    Past Medical History    Past Medical History:  Diagnosis Date   Allergic rhinitis    Arthritis    "left leg"  (02/04/2013)   Asthma    CHF (congestive heart failure) (HCC)    Chronic bronchitis (HCC)    "usually get it q yr" (02/04/2013)   GERD (gastroesophageal reflux disease)    Glaucoma    Gout    "used to; haven't had it lately" (02/04/2013)   Hypertension    Pacemaker    PONV (postoperative nausea and vomiting)    Symptomatic bradycardia    s/p STJ dual chamber pacemaker by Dr Ladona Ridgel 02/2013   Type II diabetes mellitus (HCC)    Past Surgical History:  Procedure Laterality Date   ABDOMINAL HYSTERECTOMY  ?1980's   BREAST BIOPSY Right    "benign"   BREAST LUMPECTOMY Right    "benign"   DILATION AND CURETTAGE OF UTERUS     GANGLION CYST EXCISION Right 2003   Ganglion of wrist, volar and distal radius   PACEMAKER INSERTION  02/04/2013   STJ Assurity dual chamber pacemaker implanted by Dr Ladona Ridgel for symptomatic bradycardia   PERMANENT PACEMAKER INSERTION N/A 02/04/2013   Procedure: PERMANENT PACEMAKER INSERTION;  Surgeon: Marinus Maw, MD;  Location: [facility] CATH LAB;  Service: Cardiovascular;  Laterality: N/A;   TUBAL LIGATION      Allergies  Allergies  Allergen Reactions   Moxifloxacin Hives, Shortness Of Breath and Other (See Comments)    Caused syncope and throat tightness   Penicillins Hives, Shortness Of Breath and Other (See Comments)    Caused syncope and throat tightness Has patient had a PCN reaction causing immediate rash, facial/tongue/throat swelling, SOB or lightheadedness with hypotension: Yes  Has patient had a PCN reaction causing severe rash involving mucus membranes or skin necrosis: No Has patient had a PCN reaction that required hospitalization: hospital visit but not overnight Has patient had a PCN reaction occurring within the last 10 years: No If all of the above answers are "NO", then may proceed with Cephalosporin   Arimidex [Anastrozole]     Home Medications    Prior to Admission medications   Medication Sig Start Date End Date Taking? Authorizing Provider   albuterol (VENTOLIN HFA) 108 (90 Base) MCG/ACT inhaler Inhale 1-2 puffs into the lungs every 4 (four) hours as needed for wheezing or shortness of breath. 09/27/21   Martina Sinner, MD  amLODipine (NORVASC) 10 MG tablet Take 10 mg by mouth daily.    [provider]  aspirin EC 81 MG tablet Take 81 mg by mouth daily.    [provider]  fluticasone (FLONASE) 50 MCG/ACT nasal spray Place 1 spray into both nostrils daily. 09/27/21   Martina Sinner, MD  fluticasone-salmeterol (ADVAIR) 100-50 MCG/ACT AEPB Inhale 1 puff into the lungs 2 (two) times daily.    [provider]  hydrochlorothiazide (HYDRODIURIL) 25 MG tablet Take 25 mg by mouth daily.     [provider]  hydroquinone 4 % cream Apply 1 g topically every morning. 07/20/22   [provider]  latanoprost (XALATAN) 0.005 % ophthalmic solution Place 1 drop into both eyes at bedtime. 07/12/22   [provider]  losartan (COZAAR) 100 MG tablet Take 100 mg by mouth daily.    [provider]  lovastatin (MEVACOR) 10 MG tablet Take 10 mg by mouth daily with supper. 03/15/20   [provider]  metFORMIN (GLUCOPHAGE) 1000 MG tablet Take 1,000 mg by mouth daily with breakfast. 12/23/20   [provider]  nystatin (MYCOSTATIN) 100000 UNIT/ML suspension SMARTSIG:4 Milliliter(s) By Mouth 4 Times Daily 03/18/21   Martina Sinner, MD  omeprazole (PRILOSEC) 20 MG capsule TAKE 1 CAPSULE (20 MG TOTAL) BY MOUTH 2 (TWO) TIMES DAILY BEFORE A MEAL. Patient taking differently: Take 20 mg by mouth daily. 06/13/22   Marinus Maw, MD  Polyethyl Glycol-Propyl Glycol (SYSTANE OP) Place 1 drop into both eyes daily as needed (dry eyes).    [provider]  Spacer/Aero-Holding Chambers (AEROCHAMBER MV) inhaler Use as instructed 09/27/21   Martina Sinner, MD    Physical Exam    Vital Signs:  Jessica Carpenter does not have vital signs available for review today.  Given  telephonic nature of communication, physical exam is limited. AAOx3. NAD. Normal affect.  Speech and respirations are unlabored.  Accessory Clinical Findings    None  Assessment & Plan    1.  Preoperative Cardiovascular Risk Assessment:  According to the Revised Cardiac Risk Index (RCRI), her Perioperative Risk of Major Cardiac Event is (%): 0.9. Her Functional Capacity in METs is: 5.72 according to the Duke Activity Status Index (DASI).Therefore, based on ACC/AHA guidelines, patient would be at acceptable risk for the planned procedure without further cardiovascular testing.   The patient was advised that if she develops new symptoms prior to surgery to contact our office to arrange for a follow-up visit, and she verbalized understanding.  Patient with recent TIA. Recommendations for holding aspirin and Plavix prior to surgery should come from managing provider (Neurology/PCP).   A copy of this note will be routed to requesting surgeon.  Time:   Today, I have spent 5 minutes with the  patient with telehealth technology discussing medical history, symptoms, and management plan.     Joylene Grapes, NP  10/12/2022, 3:40 PM

## 2022-10-13 ENCOUNTER — Other Ambulatory Visit: Payer: Self-pay | Admitting: Otolaryngology

## 2022-10-17 ENCOUNTER — Encounter (HOSPITAL_COMMUNITY): Payer: Self-pay | Admitting: Otolaryngology

## 2022-10-17 ENCOUNTER — Encounter: Payer: Self-pay | Admitting: Internal Medicine

## 2022-10-17 NOTE — Progress Notes (Signed)
PERIOPERATIVE PRESCRIPTION FOR IMPLANTED CARDIAC DEVICE PROGRAMMING  Patient Information: Name:  Jessica Carpenter  DOB:  [birth year]  MRN:  [id number]   Planned Procedure:  Direct Laryngoscopy with biopsy  Surgeon:  Dr. Christia Reading  Date of Procedure:  10/19/2022  Cautery will be used.  Position during surgery:  Supine   Device Information:  Clinic EP Physician:  Lewayne Bunting, MD   Device Type:  Pacemaker Manufacturer and Phone #:  St. Jude/Abbott: [phone] Pacemaker Dependent?:  Yes.   Date of Last Device Check:  08/08/2022 Normal Device Function?:  Yes.    Electrophysiologist's Recommendations:  Have magnet available. Provide continuous ECG monitoring when magnet is used or reprogramming is to be performed.  Procedure may interfere with device function.  Magnet should be placed over device during procedure.  Per Device Clinic Standing Orders, Wiliam Ke, RN  3:11 PM 10/17/2022

## 2022-10-17 NOTE — Progress Notes (Signed)
SDW call  Patient was given pre-op instructions over the phone. Patient verbalized understanding of instructions provided.     PCP - [facility] EP Cardiologist - Dr. Lewayne Bunting Pulmonary:    PPM/ICD - Pacemaker, St. Jude Device Orders - Received Rep Notified - Brian Small   Chest x-ray - na EKG -  08/13/2022 Stress Test - ECHO - 08/14/2022 Cardiac Cath -   Sleep Study/sleep apnea/CPAP: Diagnosed with sleep apnea, does not wear a CPAP  Type II diabetes Fasting Blood sugar range: 110-130 How often check sugars: Every other day Metformin, instructed to hold DOS   Blood Thinner Instructions: denies Aspirin Instructions:states last dose 10/16/2022   ERAS Protcol - Clear fluids until 0730   COVID TEST- na    Anesthesia review: Yes. HTN, CHF, DM, pacemaker   Patient denies shortness of breath, fever, cough and chest pain over the phone call  Your procedure is scheduled on Wednesday October 19, 2022  Report to [facility] Main Entrance "A" at 0800 A.M., then check in with the Admitting office.  Call this number if you have problems the morning of surgery:  [phone]   If you have any questions prior to your surgery date call [phone]: Open Monday-Friday 8am-4pm If you experience any cold or flu symptoms such as cough, fever, chills, shortness of breath, etc. between now and your scheduled surgery, please notify us at the above number    Remember:  Do not eat after midnight the night before your surgery  You may drink clear liquids until 0730  the morning of your surgery.   Clear liquids allowed are: Water, Non-Citrus Juices (without pulp), Carbonated Beverages, Clear Tea, Black Coffee ONLY (NO MILK, CREAM OR POWDERED CREAMER of any kind), and Gatorade   Take these medicines the morning of surgery with A SIP OF WATER:  Amlodipine, flonase, advair, omeprazole  As needed: albuterol  As of today, STOP taking any Aleve, Naproxen, Ibuprofen, Motrin, Advil,  Goody's, BC's, all herbal medications, fish oil, and all vitamins.

## 2022-10-18 NOTE — Progress Notes (Signed)
Anesthesia Chart Review: Jessica Carpenter  Case: [id number] Date/Time: 10/19/22 1015   Procedure: DIRECT LARYNGOSCOPY WITH BIOPSY   Anesthesia type: General   Pre-op diagnosis: Pharyngeal mass   [facility] ROOM 08 / [facility] OR   Surgeons: Christia Reading, MD       DISCUSSION: Patient is a 72 year old female scheduled for the above procedure.  History includes never smoker, post-operative N/V, HTN, asthma, DM2, GERD, HFpEF, complete heart block (s/p St. 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   [place] admission 08/13/22 - 08/14/22 for possible TIA (versus transient encephalopathy, possibly response to hypoxic nocturnal event related to undiagnosed or untreated OSA). Her husband woke up and felt his wife was breathing oddly and she did not initially respond. He thought her face appeared twisted on the right. The episode lasted about five minutes. She awoke with generalized weakness. Thre was no facial droop, dysarthria, or focal weakness in the ED. She reportedly had a similar episode of transient weakness and some confusion about seven months ago in which EMS was called, but she ultimately was not seen at a hospital. Head CT was limited by streak artifact, but showed no acute infarct. 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 Neurologist Caryl Pina, MD was consulted in the ED. She was not a candidate for TNK given resolution of symptoms. He wrote, "Nonspecific presenting symptoms of "mouth twisting" upon awakening with rapid resolution do not militate in favor of stroke or TIA. Given abnormal breathing that  awakened her husband, she may have been mildly hypoxic due to snoring." ENT consult for tonsillar mass recommend, as well as a brain MRI which could not be done due to the presence of a pacemaker. Discharged with 21 days of Plavix and continuation of home ASA. Out-patient sleep study also recommended.   Since discharge, she was evaluated by ENT Dr. Jenne Pane. He reviewed imaging. He did not visualize a pharyngeal mass on exam or by fiberoptic laryngoscopy. He recommended proceeding with direct laryngoscopy with biopsy under general anesthesia. She is no longer on Plavix.   Last EP office visit with Dr. Ladona Ridgel was on 04/28/22. She had preoperative cardiology input outlined by Bernadene Person, NP following telephonic evaluation on 10/12/22: "According to the Revised Cardiac Risk Index (RCRI), her Perioperative Risk of Major Cardiac Event is (%): 0.9. Her Functional Capacity in METs is: 5.72 according to the Duke Activity Status Index (DASI).Therefore, based on ACC/AHA guidelines, patient would be at acceptable risk for the planned procedure without further cardiovascular testing.    The patient was advised that if she develops new symptoms prior to surgery to contact our office to arrange for a follow-up visit, and she verbalized understanding.   Patient with recent TIA. Recommendations for holding aspirin and Plavix prior to surgery should come from managing provider (Neurology/PCP)."  EP PPM Perioperative Recommendations: Device Information: Device Type:  Pacemaker Manufacturer and Phone #:  St. Jude/Abbott: [phone] Pacemaker Dependent?:  Yes.   Date of Last Device Check:  08/08/2022         Normal Device Function?:  Yes.     Electrophysiologist's Recommendations: Have magnet available. Provide continuous ECG monitoring when magnet is  used or reprogramming is to be performed.  Procedure may interfere with device function.  Magnet should be placed over device during procedure.   She is no longer on  Plavix. She reported last ASA 10/16/22.   Anesthesia team to evaluate on the day of surgery. Updated labs on arrival as indicated.   VS: Ht 5\' 4"  (1.626 m)   Wt 72.6 kg   BMI 27.46 kg/m  BP Readings from Last 3 Encounters:  08/14/22 130/68  04/28/22 124/72  12/08/21 120/66   Pulse Readings from Last 3 Encounters:  08/14/22 69  04/28/22 60  12/08/21 60     PROVIDERS: Medicine, [facility] is PCP Lewayne Bunting, MD is EP cardiologist Melody Comas, MD is pulmonologist   LABS: Most recent lab results in [facility] Care Everywhere include:  09/07/22 BMP: Glucose 124, BUN 19, creatinine 1.17, sodium 140, potassium 4.6.   Lab Results  Component Value Date   WBC 7.4 08/14/2022   HGB 12.8 08/14/2022   HCT 40.1 08/14/2022   PLT 263 08/14/2022   GLUCOSE 105 (H) 08/14/2022   CHOL 120 08/13/2022   TRIG 70 08/13/2022   HDL 39 (L) 08/13/2022   LDLCALC 67 08/13/2022   ALT 13 08/13/2022   AST 16 08/13/2022   INR 1.1 08/13/2022   HGBA1C 5.9 (H) 08/13/2022     PFTs 09/14/21: FVC 2.30 (75%), post 2.31 (75%). FEV1 1.91 (83%), post 1.98 (85%). DLCO unc/cor 15.97 (79%).     IMAGES: CTA Head & Neck 08/13/22: MPRESSION: 1. 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 No obvious lymph node metastases. Recommend follow-up with ENT and routine Neck CT with IV contrast for staging. 2. Negative for large vessel occlusion. 3. 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 Generalized arterial tortuosity in the head and neck but only mild atherosclerosis and no significant arterial stenosis.  CT Head 08/13/22: IMPRESSION: 1. Limited by streak artifact. 2. No acute cortically based infarct or acute intracranial hemorrhage identified. ASPECTS 10. 3. Study discussed by telephone with Dr. Otelia Limes on 08/13/2022 at 05:10.   EKG: 08/13/22: ATRIAL PACED  RHYTHM Confirmed by Benjiman Core [phone]) on 08/14/2022 8:07:33 PM   CV: Echo 08/14/22: IMPRESSIONS   1. Left ventricular ejection fraction, by estimation, is 55 to 60%. The  left ventricle has normal function. The left ventricle has no regional  wall motion abnormalities. Left ventricular diastolic parameters are  consistent with Grade I diastolic  dysfunction (impaired relaxation).   2. Right ventricular systolic function is normal. The right ventricular  size is normal. Mildly increased right ventricular wall thickness.   3. The mitral valve is normal in structure. Trivial mitral valve  regurgitation. No evidence of mitral stenosis.   4. The aortic valve is tricuspid. There is mild calcification of the  aortic valve. Aortic valve regurgitation is not visualized. Aortic valve  sclerosis is present, with no evidence of aortic valve stenosis.   5. The inferior vena cava is normal in size with greater than 50%  respiratory variability, suggesting right atrial pressure of 3 mmHg.    Past Medical History:  Diagnosis Date   Allergic rhinitis    Arthritis    "left leg" (02/04/2013)   Asthma    CHF (congestive heart failure) (HCC)    Chronic bronchitis (HCC)    "usually get it q yr" (02/04/2013)   GERD (gastroesophageal reflux disease)  Glaucoma    Gout    "used to; haven't had it lately" (02/04/2013)   Hypertension    Pacemaker    PONV (postoperative nausea and vomiting)    Sleep apnea    Symptomatic bradycardia    s/p STJ dual chamber pacemaker by Dr Ladona Ridgel 02/2013   Type II diabetes mellitus [facility])     Past Surgical History:  Procedure Laterality Date   ABDOMINAL HYSTERECTOMY  ?1980's   BREAST BIOPSY Right    "benign"   BREAST LUMPECTOMY Right    "benign"   DILATION AND CURETTAGE OF UTERUS     GANGLION CYST EXCISION Right 2003   Ganglion of wrist, volar and distal radius   PACEMAKER INSERTION  02/04/2013   STJ Assurity dual chamber pacemaker implanted by Dr Ladona Ridgel for  symptomatic bradycardia   PERMANENT PACEMAKER INSERTION N/A 02/04/2013   Procedure: PERMANENT PACEMAKER INSERTION;  Surgeon: Marinus Maw, MD;  Location: [facility] CATH LAB;  Service: Cardiovascular;  Laterality: N/A;   TUBAL LIGATION      MEDICATIONS: No current facility-administered medications for this encounter.    albuterol (VENTOLIN HFA) 108 (90 Base) MCG/ACT inhaler   amLODipine (NORVASC) 10 MG tablet   aspirin EC 81 MG tablet   fluticasone (FLONASE) 50 MCG/ACT nasal spray   fluticasone-salmeterol (ADVAIR) 100-50 MCG/ACT AEPB   hydrochlorothiazide (HYDRODIURIL) 25 MG tablet   hydroquinone 4 % cream   latanoprost (XALATAN) 0.005 % ophthalmic solution   losartan (COZAAR) 100 MG tablet   lovastatin (MEVACOR) 10 MG tablet   metFORMIN (GLUCOPHAGE) 1000 MG tablet   nystatin (MYCOSTATIN) 100000 UNIT/ML suspension   omeprazole (PRILOSEC) 20 MG capsule   Polyethyl Glycol-Propyl Glycol (SYSTANE OP)   Spacer/Aero-Holding Chambers (AEROCHAMBER MV) inhaler    Shonna Chock, PA-C Surgical Short Stay/Anesthesiology [facility] Phone [phone] [facility] Phone [phone] 10/18/2022 10:44 AM

## 2022-10-18 NOTE — Anesthesia Preprocedure Evaluation (Addendum)
Anesthesia Evaluation  Patient identified by MRN, date of birth, ID band Patient awake    Reviewed: Allergy & Precautions, H&P , NPO status , Patient's Chart, lab work & pertinent test results  History of Anesthesia Complications (+) PONV and history of anesthetic complications  Airway Mallampati: I  TM Distance: >3 FB Neck ROM: Full    Dental no notable dental hx. (+) Poor Dentition, Chipped, Missing, Dental Advisory Given,    Pulmonary neg pulmonary ROS, asthma , sleep apnea    Pulmonary exam normal breath sounds clear to auscultation       Cardiovascular Exercise Tolerance: Good hypertension, Pt. on medications +CHF  negative cardio ROS Normal cardiovascular exam+ dysrhythmias + pacemaker  Rhythm:Regular Rate:Normal  EKG: 08/13/22: ATRIAL PACED RHYTHM Confirmed by Benjiman Core [phone]) on 08/14/2022 8:07:33 PM     CV: Echo 08/14/22: IMPRESSIONS   1. Left ventricular ejection fraction, by estimation, is 55 to 60%. The  left ventricle has normal function. The left ventricle has no regional  wall motion abnormalities. Left ventricular diastolic parameters are  consistent with Grade I diastolic  dysfunction (impaired relaxation).   2. Right ventricular systolic function is normal. The right ventricular  size is normal. Mildly increased right ventricular wall thickness.   3. The mitral valve is normal in structure. Trivial mitral valve  regurgitation. No evidence of mitral stenosis.   4. The aortic valve is tricuspid. There is mild calcification of the  aortic valve. Aortic valve regurgitation is not visualized. Aortic valve  sclerosis is present, with no evidence of aortic valve stenosis.   5. The inferior vena cava is normal in size with greater than 50%  respiratory variability, suggesting right atrial pressure of 3 mmHg.       Neuro/Psych TIAnegative neurological ROS  negative psych ROS   GI/Hepatic negative GI  ROS, Neg liver ROS,GERD  Medicated,,  Endo/Other  negative endocrine ROSdiabetes    Renal/GU negative Renal ROS  negative genitourinary   Musculoskeletal negative musculoskeletal ROS (+) Arthritis ,    Abdominal   Peds negative pediatric ROS (+)  Hematology negative hematology ROS (+)   Anesthesia Other Findings   Reproductive/Obstetrics negative OB ROS                             Anesthesia Physical Anesthesia Plan  ASA: 3  Anesthesia Plan: General   Post-op Pain Management: Minimal or no pain anticipated, Tylenol PO (pre-op)* and Celebrex PO (pre-op)*   Induction: Intravenous  PONV Risk Score and Plan: 3 and Ondansetron, Dexamethasone, Treatment may vary due to age or medical condition and TIVA  Airway Management Planned: Oral ETT and Video Laryngoscope Planned  Additional Equipment: None  Intra-op Plan:   Post-operative Plan: Extubation in OR  Informed Consent: I have reviewed the patients History and Physical, chart, labs and discussed the procedure including the risks, benefits and alternatives for the proposed anesthesia with the patient or authorized representative who has indicated his/her understanding and acceptance.       Plan Discussed with: Anesthesiologist and CRNA  Anesthesia Plan Comments: (PAT note written 10/18/2022 by Shonna Chock, PA-C.  History includes never smoker, post-operative N/V, HTN, asthma, DM2, GERD, HFpEF, complete heart block (s/p St. 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    Jessica Carpenter admission 08/13/22 - 08/14/22 for possible TIA (versus transient encephalopathy, possibly response to hypoxic nocturnal event related to undiagnosed or untreated OSA). Her  husband woke up and felt his wife was breathing oddly and she did not initially respond. He thought her face appeared twisted on the right. The episode lasted about five minutes. She awoke with generalized  weakness. Thre was no facial droop, dysarthria, or focal weakness in the ED. She reportedly had a similar episode of transient weakness and some confusion about seven months ago in which EMS was called, but she ultimately was not seen at a hospital. Head CT was limited by streak artifact, but showed no acute infarct. 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 Neurologist Caryl Pina, MD was consulted in the ED. She was not a candidate for TNK given resolution of symptoms. He wrote, "Nonspecific presenting symptoms of "mouth twisting" upon awakening with rapid resolution do not militate in favor of stroke or TIA. Given abnormal breathing that awakened her husband, she may have been mildly hypoxic due to snoring." ENT consult for tonsillar mass recommend, as well as a brain MRI which could not be done due to the presence of a pacemaker. Discharged with 21 days of Plavix and continuation of home ASA. Out-patient sleep study also recommended.    Since discharge, she was evaluated by ENT Dr. Jenne Pane. He reviewed imaging. He did not visualize a pharyngeal mass on exam or by fiberoptic laryngoscopy. He recommended proceeding with direct laryngoscopy with biopsy under general anesthesia. She is no longer on Plavix.    Last EP office visit with Dr. Ladona Ridgel was on 04/28/22. She had preoperative cardiology input outlined by Bernadene Person, NP following telephonic evaluation on 10/12/22: "According to the Revised Cardiac Risk Index (RCRI), her Perioperative Risk of Major Cardiac Event is (%): 0.9. Her Functional Capacity in METs is: 5.72 according to the [facility] Activity Status  Index (DASI).Therefore, based on ACC/AHA guidelines, patient would be at acceptable risk for the planned procedure without further cardiovascular testing.    The patient was advised that if she develops new symptoms prior to surgery to contact our office to arrange for a follow-up visit, and she verbalized understanding.   Patient with recent TIA. Recommendations for holding aspirin and Plavix prior to surgery should come from managing provider (Neurology/PCP)."   EP PPM Perioperative Recommendations: Device Information: Device Type:  Pacemaker Manufacturer and Phone #:  St. Jude/Abbott: [phone] Pacemaker Dependent?:  Yes.   Date of Last Device Check:  08/08/2022         Normal Device Function?:  Yes.     Electrophysiologist's Recommendations:  Have magnet available.  Provide continuous ECG monitoring when magnet is used or reprogramming is to be performed.   Procedure may interfere with device function.  Magnet should be placed over device during procedure.   She is no longer on Plavix. She reported last ASA 10/16/22.    )       Anesthesia Quick Evaluation

## 2022-10-19 ENCOUNTER — Other Ambulatory Visit: Payer: Self-pay

## 2022-10-19 ENCOUNTER — Ambulatory Visit (HOSPITAL_COMMUNITY): Payer: Medicare Other | Admitting: Physician Assistant

## 2022-10-19 ENCOUNTER — Encounter (HOSPITAL_COMMUNITY): Payer: Self-pay | Admitting: Otolaryngology

## 2022-10-19 ENCOUNTER — Ambulatory Visit (HOSPITAL_COMMUNITY)
Admission: RE | Admit: 2022-10-19 | Discharge: 2022-10-19 | Disposition: A | Discharge status: Home / Self Care | Payer: Medicare Other | Attending: Otolaryngology | Admitting: Otolaryngology

## 2022-10-19 ENCOUNTER — Ambulatory Visit (HOSPITAL_BASED_OUTPATIENT_CLINIC_OR_DEPARTMENT_OTHER): Payer: Medicare Other | Admitting: Physician Assistant

## 2022-10-19 ENCOUNTER — Encounter (HOSPITAL_COMMUNITY)
Admission: RE | Disposition: A | Discharge status: Home / Self Care | Payer: Self-pay | Source: Home / Self Care | Attending: Otolaryngology

## 2022-10-19 DIAGNOSIS — Z950 Presence of cardiac pacemaker: Secondary | ICD-10-CM | POA: Insufficient documentation

## 2022-10-19 DIAGNOSIS — I509 Heart failure, unspecified: Secondary | ICD-10-CM | POA: Diagnosis not present

## 2022-10-19 DIAGNOSIS — I110 Hypertensive heart disease with heart failure: Secondary | ICD-10-CM | POA: Diagnosis not present

## 2022-10-19 DIAGNOSIS — Z1152 Encounter for screening for COVID-19: Secondary | ICD-10-CM | POA: Insufficient documentation

## 2022-10-19 DIAGNOSIS — G4733 Obstructive sleep apnea (adult) (pediatric): Secondary | ICD-10-CM | POA: Insufficient documentation

## 2022-10-19 DIAGNOSIS — H42 Glaucoma in diseases classified elsewhere: Secondary | ICD-10-CM | POA: Diagnosis not present

## 2022-10-19 DIAGNOSIS — J358 Other chronic diseases of tonsils and adenoids: Secondary | ICD-10-CM | POA: Diagnosis not present

## 2022-10-19 DIAGNOSIS — I442 Atrioventricular block, complete: Secondary | ICD-10-CM | POA: Diagnosis not present

## 2022-10-19 DIAGNOSIS — I5032 Chronic diastolic (congestive) heart failure: Secondary | ICD-10-CM | POA: Insufficient documentation

## 2022-10-19 DIAGNOSIS — E1139 Type 2 diabetes mellitus with other diabetic ophthalmic complication: Secondary | ICD-10-CM | POA: Diagnosis not present

## 2022-10-19 DIAGNOSIS — J392 Other diseases of pharynx: Secondary | ICD-10-CM | POA: Diagnosis not present

## 2022-10-19 DIAGNOSIS — K219 Gastro-esophageal reflux disease without esophagitis: Secondary | ICD-10-CM | POA: Insufficient documentation

## 2022-10-19 HISTORY — DX: Sleep apnea, unspecified: G47.30

## 2022-10-19 HISTORY — PX: DIRECT LARYNGOSCOPY: SHX5326

## 2022-10-19 LAB — CBC
HCT: 43.1 % (ref 36.0–46.0)
Hemoglobin: 13.5 g/dL (ref 12.0–15.0)
MCH: 27.1 pg (ref 26.0–34.0)
MCHC: 31.3 g/dL (ref 30.0–36.0)
MCV: 86.5 fL (ref 80.0–100.0)
Platelets: 330 10*3/uL (ref 150–400)
RBC: 4.98 MIL/uL (ref 3.87–5.11)
RDW: 14.6 % (ref 11.5–15.5)
WBC: 6.1 10*3/uL (ref 4.0–10.5)
nRBC: 0 % (ref 0.0–0.2)

## 2022-10-19 LAB — GLUCOSE, CAPILLARY
Glucose-Capillary: 104 mg/dL — ABNORMAL HIGH (ref 70–99)
Glucose-Capillary: 94 mg/dL (ref 70–99)

## 2022-10-19 LAB — POCT I-STAT, CHEM 8
BUN: 21 mg/dL (ref 8–23)
Calcium, Ion: 1.2 mmol/L (ref 1.15–1.40)
Chloride: 107 mmol/L (ref 98–111)
Creatinine, Ser: 1.1 mg/dL — ABNORMAL HIGH (ref 0.44–1.00)
Glucose, Bld: 99 mg/dL (ref 70–99)
HCT: 43 % (ref 36.0–46.0)
Hemoglobin: 14.6 g/dL (ref 12.0–15.0)
Potassium: 3.7 mmol/L (ref 3.5–5.1)
Sodium: 143 mmol/L (ref 135–145)
TCO2: 21 mmol/L — ABNORMAL LOW (ref 22–32)

## 2022-10-19 LAB — SARS CORONAVIRUS 2 BY RT PCR: SARS Coronavirus 2 by RT PCR: NEGATIVE

## 2022-10-19 SURGERY — LARYNGOSCOPY, DIRECT
Anesthesia: General | Site: Mouth

## 2022-10-19 MED ORDER — ROCURONIUM BROMIDE 10 MG/ML (PF) SYRINGE
PREFILLED_SYRINGE | INTRAVENOUS | Status: AC
  Filled 2022-10-19: qty 10

## 2022-10-19 MED ORDER — DEXAMETHASONE SODIUM PHOSPHATE 10 MG/ML IJ SOLN
INTRAMUSCULAR | Status: AC
  Filled 2022-10-19: qty 1

## 2022-10-19 MED ORDER — PROPOFOL 10 MG/ML IV BOLUS
INTRAVENOUS | Status: AC
  Filled 2022-10-19: qty 20

## 2022-10-19 MED ORDER — SUGAMMADEX SODIUM 200 MG/2ML IV SOLN
INTRAVENOUS | Status: DC | PRN
  Administered 2022-10-19: 200 mg via INTRAVENOUS

## 2022-10-19 MED ORDER — ONDANSETRON HCL 4 MG/2ML IJ SOLN: 4.0000 mg | Freq: Once | INTRAMUSCULAR | Status: DC | PRN

## 2022-10-19 MED ORDER — ACETAMINOPHEN 325 MG PO TABS: 325.0000 mg | ORAL_TABLET | ORAL | Status: DC | PRN

## 2022-10-19 MED ORDER — LIDOCAINE-EPINEPHRINE 1 %-1:100000 IJ SOLN
INTRAMUSCULAR | Status: AC
  Filled 2022-10-19: qty 1

## 2022-10-19 MED ORDER — SODIUM CHLORIDE 0.9 % IV SOLN
INTRAVENOUS | Status: DC | PRN
Start: 2022-10-19 — End: 2022-10-19

## 2022-10-19 MED ORDER — LIDOCAINE 2% (20 MG/ML) 5 ML SYRINGE
INTRAMUSCULAR | Status: AC
  Filled 2022-10-19: qty 5

## 2022-10-19 MED ORDER — ONDANSETRON HCL 4 MG/2ML IJ SOLN
INTRAMUSCULAR | Status: AC
  Filled 2022-10-19: qty 2

## 2022-10-19 MED ORDER — FENTANYL CITRATE (PF) 250 MCG/5ML IJ SOLN
INTRAMUSCULAR | Status: DC | PRN
  Administered 2022-10-19: 50 ug via INTRAVENOUS

## 2022-10-19 MED ORDER — ORAL CARE MOUTH RINSE: 15.0000 mL | Freq: Once | OROMUCOSAL | Status: AC

## 2022-10-19 MED ORDER — SUCCINYLCHOLINE CHLORIDE 200 MG/10ML IV SOSY
PREFILLED_SYRINGE | INTRAVENOUS | Status: AC
  Filled 2022-10-19: qty 10

## 2022-10-19 MED ORDER — OXYCODONE HCL 5 MG PO TABS: 5.0000 mg | ORAL_TABLET | Freq: Once | ORAL | Status: DC | PRN

## 2022-10-19 MED ORDER — MEPERIDINE HCL 25 MG/ML IJ SOLN: 6.2500 mg | INTRAMUSCULAR | Status: DC | PRN

## 2022-10-19 MED ORDER — FENTANYL CITRATE (PF) 100 MCG/2ML IJ SOLN: 25.0000 ug | INTRAMUSCULAR | Status: DC | PRN

## 2022-10-19 MED ORDER — ACETAMINOPHEN 500 MG PO TABS
1000.0000 mg | ORAL_TABLET | Freq: Once | ORAL | Status: AC
  Administered 2022-10-19: 1000 mg via ORAL
  Filled 2022-10-19: qty 2

## 2022-10-19 MED ORDER — LIDOCAINE 2% (20 MG/ML) 5 ML SYRINGE
INTRAMUSCULAR | Status: DC | PRN
  Administered 2022-10-19: 100 mg via INTRAVENOUS

## 2022-10-19 MED ORDER — OXYMETAZOLINE HCL 0.05 % NA SOLN
NASAL | Status: AC
  Filled 2022-10-19: qty 30

## 2022-10-19 MED ORDER — LIDOCAINE-EPINEPHRINE (PF) 1 %-1:200000 IJ SOLN
INTRAMUSCULAR | Status: DC | PRN
Start: 2022-10-19 — End: 2022-10-19
  Administered 2022-10-19: 2 mg

## 2022-10-19 MED ORDER — OXYCODONE HCL 5 MG/5ML PO SOLN: 5.0000 mg | Freq: Once | ORAL | Status: DC | PRN

## 2022-10-19 MED ORDER — ROCURONIUM BROMIDE 10 MG/ML (PF) SYRINGE
PREFILLED_SYRINGE | INTRAVENOUS | Status: DC | PRN
  Administered 2022-10-19: 30 mg via INTRAVENOUS

## 2022-10-19 MED ORDER — EPINEPHRINE HCL (NASAL) 0.1 % NA SOLN
NASAL | Status: AC
  Filled 2022-10-19: qty 30

## 2022-10-19 MED ORDER — ONDANSETRON HCL 4 MG/2ML IJ SOLN
INTRAMUSCULAR | Status: DC | PRN
  Administered 2022-10-19: 4 mg via INTRAVENOUS

## 2022-10-19 MED ORDER — CELECOXIB 200 MG PO CAPS
200.0000 mg | ORAL_CAPSULE | Freq: Once | ORAL | Status: AC
  Administered 2022-10-19: 200 mg via ORAL
  Filled 2022-10-19: qty 1

## 2022-10-19 MED ORDER — ACETAMINOPHEN 160 MG/5ML PO SOLN: 325.0000 mg | ORAL | Status: DC | PRN

## 2022-10-19 MED ORDER — PROPOFOL 10 MG/ML IV BOLUS
INTRAVENOUS | Status: DC | PRN
  Administered 2022-10-19: 80 mg via INTRAVENOUS
  Administered 2022-10-19: 125 ug/kg/min via INTRAVENOUS

## 2022-10-19 MED ORDER — CHLORHEXIDINE GLUCONATE 0.12 % MT SOLN
15.0000 mL | Freq: Once | OROMUCOSAL | Status: AC
  Administered 2022-10-19: 15 mL via OROMUCOSAL
  Filled 2022-10-19: qty 15

## 2022-10-19 MED ORDER — FENTANYL CITRATE (PF) 250 MCG/5ML IJ SOLN
INTRAMUSCULAR | Status: AC
  Filled 2022-10-19: qty 5

## 2022-10-19 MED ORDER — DEXAMETHASONE SODIUM PHOSPHATE 10 MG/ML IJ SOLN
INTRAMUSCULAR | Status: DC | PRN
  Administered 2022-10-19: 10 mg via INTRAVENOUS

## 2022-10-19 SURGICAL SUPPLY — 30 items
BAG COUNTER SPONGE SURGICOUNT (BAG) ×1 IMPLANT
BAG SPNG CNTER NS LX DISP (BAG) ×1
BALLN PULM 15 16.5 18X75 (BALLOONS)
BALLOON PULM 15 16.5 18X75 (BALLOONS) IMPLANT
CANISTER SUCT 3000ML PPV (MISCELLANEOUS) ×1 IMPLANT
CNTNR URN SCR LID CUP LEK RST (MISCELLANEOUS) IMPLANT
CONT SPEC 4OZ STRL OR WHT (MISCELLANEOUS)
COVER BACK TABLE 60X90IN (DRAPES) ×1 IMPLANT
COVER MAYO STAND STRL (DRAPES) ×1 IMPLANT
DRAPE HALF SHEET 40X57 (DRAPES) ×1 IMPLANT
GAUZE 4X4 16PLY ~~LOC~~+RFID DBL (SPONGE) ×1 IMPLANT
GLOVE BIO SURGEON STRL SZ7.5 (GLOVE) ×1 IMPLANT
GOWN STRL REUS W/ TWL LRG LVL3 (GOWN DISPOSABLE) IMPLANT
GOWN STRL REUS W/TWL LRG LVL3 (GOWN DISPOSABLE)
GUARD TEETH (MISCELLANEOUS) ×1 IMPLANT
KIT TURNOVER KIT B (KITS) ×1 IMPLANT
NDL HYPO 25GX1X1/2 BEV (NEEDLE) IMPLANT
NDL TRANS ORAL INJECTION (NEEDLE) IMPLANT
NEEDLE HYPO 25GX1X1/2 BEV (NEEDLE) ×1
NEEDLE TRANS ORAL INJECTION (NEEDLE)
NS IRRIG 1000ML POUR BTL (IV SOLUTION) ×1 IMPLANT
PAD ARMBOARD 7.5X6 YLW CONV (MISCELLANEOUS) ×2 IMPLANT
PATTIES SURGICAL .5 X3 (DISPOSABLE) IMPLANT
PENCIL BUTTON HOLSTER BLD 10FT (ELECTRODE) IMPLANT
POSITIONER HEAD DONUT 9IN (MISCELLANEOUS) IMPLANT
SOL ANTI FOG 6CC (MISCELLANEOUS) IMPLANT
SURGILUBE 2OZ TUBE FLIPTOP (MISCELLANEOUS) IMPLANT
SYR 10ML LL (SYRINGE) IMPLANT
TOWEL GREEN STERILE FF (TOWEL DISPOSABLE) ×2 IMPLANT
TUBE CONNECTING 12X1/4 (SUCTIONS) ×1 IMPLANT

## 2022-10-19 NOTE — Transfer of Care (Signed)
Immediate Anesthesia Transfer of Care Note  Patient: SHADE RIVENBARK  Procedure(s) Performed: DIRECT LARYNGOSCOPY WITH BIOPSY (Mouth)  Patient Location: PACU  Anesthesia Type:General  Level of Consciousness: sedated  Airway & Oxygen Therapy: Patient Spontanous Breathing and Patient connected to nasal cannula oxygen  Post-op Assessment: Report given to RN and Post -op Vital signs reviewed and stable  Post vital signs: stable  Last Vitals:  Vitals Value Taken Time  BP 114/68 10/19/22 1100  Temp    Pulse 60 10/19/22 1102  Resp 17 10/19/22 1102  SpO2 100 % 10/19/22 1102  Vitals shown include unfiled device data.  Last Pain:  Vitals:   10/19/22 0830  TempSrc:   PainSc: 0-No pain      Patients Stated Pain Goal: 0 (10/19/22 0830)  Complications: There were no known notable events for this encounter.

## 2022-10-19 NOTE — H&P (Signed)
Jessica Carpenter is an 72 y.o. female.   Chief Complaint: Pharyngeal mass HPI: 72 year old female who recently underwent imaging that incidentally demonstrated an abnormal area in the left throat.  Examination in the office was unrevealing.  Past Medical History:  Diagnosis Date   Allergic rhinitis    Arthritis    "left leg" (02/04/2013)   Asthma    CHF (congestive heart failure) (HCC)    Chronic bronchitis (HCC)    "usually get it q yr" (02/04/2013)   GERD (gastroesophageal reflux disease)    Glaucoma    Gout    "used to; haven't had it lately" (02/04/2013)   Hypertension    Pacemaker    PONV (postoperative nausea and vomiting)    Sleep apnea    Symptomatic bradycardia    s/p STJ dual chamber pacemaker by Dr Ladona Ridgel 02/2013   Type II diabetes mellitus (HCC)     Past Surgical History:  Procedure Laterality Date   ABDOMINAL HYSTERECTOMY  ?1980's   BREAST BIOPSY Right    "benign"   BREAST LUMPECTOMY Right    "benign"   DILATION AND CURETTAGE OF UTERUS     GANGLION CYST EXCISION Right 2003   Ganglion of wrist, volar and distal radius   PACEMAKER INSERTION  02/04/2013   STJ Assurity dual chamber pacemaker implanted by Dr Ladona Ridgel for symptomatic bradycardia   PERMANENT PACEMAKER INSERTION N/A 02/04/2013   Procedure: PERMANENT PACEMAKER INSERTION;  Surgeon: Marinus Maw, MD;  Location: [facility] CATH LAB;  Service: Cardiovascular;  Laterality: N/A;   TUBAL LIGATION      Family History  Problem Relation Age of Onset   Breast cancer Mother    Emphysema Father    Colon polyps Neg Hx    Colon cancer Neg Hx    Esophageal cancer Neg Hx    Rectal cancer Neg Hx    Stomach cancer Neg Hx    Social History:  reports that she has never smoked. She has never used smokeless tobacco. She reports that she does not currently use alcohol. She reports that she does not use drugs.  Allergies:  Allergies  Allergen Reactions   Moxifloxacin Hives, Shortness Of Breath and Other (See Comments)    Caused  syncope and throat tightness   Penicillins Hives, Shortness Of Breath and Other (See Comments)    Caused syncope and throat tightness Has patient had a PCN reaction causing immediate rash, facial/tongue/throat swelling, SOB or lightheadedness with hypotension: Yes Has patient had a PCN reaction causing severe rash involving mucus membranes or skin necrosis: No Has patient had a PCN reaction that required hospitalization: hospital visit but not overnight Has patient had a PCN reaction occurring within the last 10 years: No If all of the above answers are "NO", then may proceed with Cephalosporin   Arimidex [Anastrozole]     Medications Prior to Admission  Medication Sig Dispense Refill   albuterol (VENTOLIN HFA) 108 (90 Base) MCG/ACT inhaler Inhale 1-2 puffs into the lungs every 4 (four) hours as needed for wheezing or shortness of breath. 1 each 6   amLODipine (NORVASC) 10 MG tablet Take 10 mg by mouth daily.     aspirin EC 81 MG tablet Take 81 mg by mouth daily.     fluticasone (FLONASE) 50 MCG/ACT nasal spray Place 1 spray into both nostrils daily. 16 g 11   fluticasone-salmeterol (ADVAIR) 100-50 MCG/ACT AEPB Inhale 1 puff into the lungs 2 (two) times daily.     hydrochlorothiazide (HYDRODIURIL)  25 MG tablet Take 25 mg by mouth daily.      hydroquinone 4 % cream Apply 1 g topically every morning.     latanoprost (XALATAN) 0.005 % ophthalmic solution Place 1 drop into both eyes at bedtime.     losartan (COZAAR) 100 MG tablet Take 100 mg by mouth daily.     lovastatin (MEVACOR) 10 MG tablet Take 10 mg by mouth daily with supper.     metFORMIN (GLUCOPHAGE) 1000 MG tablet Take 1,000 mg by mouth daily with breakfast.     nystatin (MYCOSTATIN) 100000 UNIT/ML suspension SMARTSIG:4 Milliliter(s) By Mouth 4 Times Daily 60 mL 6   omeprazole (PRILOSEC) 20 MG capsule TAKE 1 CAPSULE (20 MG TOTAL) BY MOUTH 2 (TWO) TIMES DAILY BEFORE A MEAL. (Patient taking differently: Take 20 mg by mouth daily.) 180  capsule 3   Polyethyl Glycol-Propyl Glycol (SYSTANE OP) Place 1 drop into both eyes daily as needed (dry eyes).     Spacer/Aero-Holding Chambers (AEROCHAMBER MV) inhaler Use as instructed 1 each 0    Results for orders placed or performed during the hospital encounter of 10/19/22 (from the past 48 hour(s))  Glucose, capillary     Status: Abnormal   Collection Time: 10/19/22  8:21 AM  Result Value Ref Range   Glucose-Capillary 104 (H) 70 - 99 mg/dL    Comment: Glucose reference range applies only to samples taken after fasting for at least 8 hours.  CBC per protocol     Status: None   Collection Time: 10/19/22  8:40 AM  Result Value Ref Range   WBC 6.1 4.0 - 10.5 K/uL   RBC 4.98 3.87 - 5.11 MIL/uL   Hemoglobin 13.5 12.0 - 15.0 g/dL   HCT 16.1 09.6 - 04.5 %   MCV 86.5 80.0 - 100.0 fL   MCH 27.1 26.0 - 34.0 pg   MCHC 31.3 30.0 - 36.0 g/dL   RDW 40.9 81.1 - 91.4 %   Platelets 330 150 - 400 K/uL   nRBC 0.0 0.0 - 0.2 %    Comment: Performed at [facility], [address] [place], [place] [address]  SARS Coronavirus 2 by RT PCR (hospital order, performed in [facility] hospital lab) *cepheid single result test* Anterior Nasal Swab     Status: None   Collection Time: 10/19/22  8:46 AM   Specimen: Anterior Nasal Swab  Result Value Ref Range   SARS Coronavirus 2 by RT PCR NEGATIVE NEGATIVE    Comment: Performed at [facility], [address], [place], [place] [address]  I-STAT, [place] [address]     Status: Abnormal   Collection Time: 10/19/22  9:25 AM  Result Value Ref Range   Sodium 143 135 - 145 mmol/L   Potassium 3.7 3.5 - 5.1 mmol/L   Chloride 107 98 - 111 mmol/L   BUN 21 8 - 23 mg/dL   Creatinine, Ser 8.65 (H) 0.44 - 1.00 mg/dL   Glucose, Bld 99 70 - 99 mg/dL    Comment: Glucose reference range applies only to samples taken after fasting for at least 8 hours.   Calcium, Ion 1.20 1.15 - 1.40 mmol/L   TCO2 21 (L) 22 - 32 mmol/L   Hemoglobin 14.6 12.0 - 15.0 g/dL   HCT 78.4  69.6 - 29.5 %   No results found.  Review of Systems  All other systems reviewed and are negative.   Blood pressure (!) 145/78, pulse 60, temperature 98.3 F (36.8 C),  temperature source Oral, resp. rate 18, height 5\' 4"  (1.626 m), weight 72.6 kg, SpO2 95%. Physical Exam Constitutional:      Appearance: Normal appearance. She is normal weight.  HENT:     Head: Normocephalic and atraumatic.     Right Ear: External ear normal.     Left Ear: External ear normal.     Nose: Nose normal.     Mouth/Throat:     Mouth: Mucous membranes are moist.     Pharynx: Oropharynx is clear.  Eyes:     Extraocular Movements: Extraocular movements intact.     Conjunctiva/sclera: Conjunctivae normal.     Pupils: Pupils are equal, round, and reactive to light.  Cardiovascular:     Rate and Rhythm: Normal rate.  Pulmonary:     Effort: Pulmonary effort is normal.  Musculoskeletal:     Cervical back: Normal range of motion.  Skin:    General: Skin is warm and dry.  Neurological:     General: No focal deficit present.     Mental Status: She is alert and oriented to person, place, and time.  Psychiatric:        Mood and Affect: Mood normal.        Behavior: Behavior normal.        Thought Content: Thought content normal.        Judgment: Judgment normal.      Assessment/Plan Pharyngeal mass  To OR for direct laryngoscopy with biopsy.  Christia Reading, MD 10/19/2022, 10:07 AM

## 2022-10-19 NOTE — Progress Notes (Signed)
Chip to [employer] [place] [employer] prior to case start.

## 2022-10-19 NOTE — Op Note (Signed)
PREOPERATIVE DIAGNOSIS:  Left pharyngeal mass   POSTOPERATIVE DIAGNOSIS:  Left tonsil fossa mass   PROCEDURE: Direct laryngoscopy with biopsy   SURGEON:  Christia Reading, MD   ANESTHESIA:  General anesthesia.   COMPLICATIONS:  None.   INDICATIONS:  The patient is a 72 year old female who recently underwent imaging that incidentally demonstrated a left pharyngeal wall mass.  Exam in the office was fairly unremarkable.  She presents to the operating room for surgical management.   FINDINGS:  Post-tonsillectomy findings.  Left tonsil fossa with submucosal mass.  Biopsy performed and sent in saline for lymphoma protocol.  Remainder of pharynx and larynx was unremarkable.   DESCRIPTION OF PROCEDURE:  The patient was identified in the holding room, informed consent having been obtained including discussion of risks, benefits and alternatives, the patient was brought to the operative suite and put the operative table in the supine position.  Anesthesia was induced and the patient was intubated by the Anesthesia team without difficulty.  The eyes were taped closed and bed was turned 90 degrees from anesthesia.  The patient was given intravenous steroids during the case.  A tooth guard was placed over the upper teeth and a Stortz laryngoscope used to evaluate the various areas of the larynx and pharynx.  With nothing obviously abnormal, the laryngoscope was removed along with the tooth guard.  A Crow-Davis retractor was then inserted, opened to reveal the oropharynx, and suspended to the Mayo stand.  The left tonsil fossa was visualized and palpated.  A submucosal mass was evident.  The area was injected with local anesthetic and a small vertical incision was made with electrocautery.  The homogeneous mass was encountered and two cup forceps biopsies were taken and passed to nursing for pathology to be sent fresh for lymphoma protocol.  The small incision was left open.  The retractor was taken out of suspension  and removed from the patient's mouth.  The patient was turned back to anesthesia for wakeup, was extubated, and taken to the recovery room in stable condition.

## 2022-10-19 NOTE — Anesthesia Postprocedure Evaluation (Signed)
Anesthesia Post Note  Patient: Jessica Carpenter  Procedure(s) Performed: DIRECT LARYNGOSCOPY WITH BIOPSY (Mouth)     Patient location during evaluation: PACU Anesthesia Type: General Level of consciousness: awake and alert Pain management: pain level controlled Vital Signs Assessment: post-procedure vital signs reviewed and stable Respiratory status: spontaneous breathing, nonlabored ventilation, respiratory function stable and patient connected to nasal cannula oxygen Cardiovascular status: blood pressure returned to baseline and stable Postop Assessment: no apparent nausea or vomiting Anesthetic complications: no   There were no known notable events for this encounter.  Last Vitals:  Vitals:   10/19/22 0819 10/19/22 1100  BP: (!) 145/78 114/68  Pulse: 60 60  Resp: 18 14  Temp: 36.8 C (!) 36.4 C  SpO2: 95% 100%    Last Pain:  Vitals:   10/19/22 1100  TempSrc:   PainSc: 0-No pain                 Elmo Rio

## 2022-10-19 NOTE — Brief Op Note (Signed)
10/19/2022  10:47 AM  PATIENT:  [name]  72 y.o. female  PRE-OPERATIVE DIAGNOSIS:  Pharyngeal mass  POST-OPERATIVE DIAGNOSIS:  Pharyngeal mass  PROCEDURE:  Procedure(s): DIRECT LARYNGOSCOPY WITH BIOPSY (N/A)  SURGEON:  Surgeons and Role:    Christia Reading, MD - Primary  PHYSICIAN ASSISTANT:   ASSISTANTS: none   ANESTHESIA:   general  EBL:  Minimal   BLOOD ADMINISTERED:none  DRAINS: none   LOCAL MEDICATIONS USED:  LIDOCAINE   SPECIMEN:  Source of Specimen:  Left tonsil fossa mass  DISPOSITION OF SPECIMEN:  PATHOLOGY  COUNTS:  YES  TOURNIQUET:  * No tourniquets in log *  DICTATION: .Note written in EPIC  PLAN OF CARE: Discharge to home after PACU  PATIENT DISPOSITION:  PACU - hemodynamically stable.   Delay start of Pharmacological VTE agent (>24hrs) due to surgical blood loss or risk of bleeding: no

## 2022-10-19 NOTE — Anesthesia Procedure Notes (Signed)
Procedure Name: Intubation Date/Time: 10/19/2022 10:24 AM  Performed by: Alease Medina, CRNAPre-anesthesia Checklist: Patient identified, Emergency Drugs available, Suction available and Patient being monitored Patient Re-evaluated:Patient Re-evaluated prior to induction Oxygen Delivery Method: Circle system utilized Preoxygenation: Pre-oxygenation with 100% oxygen Induction Type: IV induction Ventilation: Mask ventilation without difficulty Laryngoscope Size: Glidescope and 3 Grade View: Grade I Tube type: Oral Number of attempts: 1 Airway Equipment and Method: Stylet and Oral airway Placement Confirmation: ETT inserted through vocal cords under direct vision, positive ETCO2 and breath sounds checked- equal and bilateral Secured at: 22 cm Tube secured with: Tape Dental Injury: Teeth and Oropharynx as per pre-operative assessment

## 2022-10-20 ENCOUNTER — Encounter (HOSPITAL_COMMUNITY): Payer: Self-pay | Admitting: Otolaryngology

## 2022-10-21 LAB — SURGICAL PATHOLOGY

## 2022-11-07 ENCOUNTER — Ambulatory Visit (INDEPENDENT_AMBULATORY_CARE_PROVIDER_SITE_OTHER): Payer: Medicare Other

## 2022-11-07 DIAGNOSIS — I442 Atrioventricular block, complete: Secondary | ICD-10-CM

## 2022-11-07 LAB — CUP PACEART REMOTE DEVICE CHECK
Battery Remaining Longevity: 12 mo
Battery Remaining Percentage: 10 %
Battery Voltage: 2.83 V
Brady Statistic AP VP Percent: 51 %
Brady Statistic AP VS Percent: 1 %
Brady Statistic AS VP Percent: 49 %
Brady Statistic AS VS Percent: 1 %
Brady Statistic RA Percent Paced: 51 %
Brady Statistic RV Percent Paced: 99 %
Date Time Interrogation Session: 20241104010016
Implantable Lead Connection Status: 753985
Implantable Lead Connection Status: 753985
Implantable Lead Implant Date: 20150202
Implantable Lead Implant Date: 20150202
Implantable Lead Location: 753859
Implantable Lead Location: 753860
Implantable Pulse Generator Implant Date: 20150202
Lead Channel Impedance Value: 450 Ohm
Lead Channel Impedance Value: 480 Ohm
Lead Channel Pacing Threshold Amplitude: 0.5 V
Lead Channel Pacing Threshold Amplitude: 0.75 V
Lead Channel Pacing Threshold Pulse Width: 0.4 ms
Lead Channel Pacing Threshold Pulse Width: 0.4 ms
Lead Channel Sensing Intrinsic Amplitude: 3.6 mV
Lead Channel Sensing Intrinsic Amplitude: 3.9 mV
Lead Channel Setting Pacing Amplitude: 1 V
Lead Channel Setting Pacing Amplitude: 2 V
Lead Channel Setting Pacing Pulse Width: 0.4 ms
Lead Channel Setting Sensing Sensitivity: 2 mV
Pulse Gen Model: 2240
Pulse Gen Serial Number: 7588973

## 2022-12-05 NOTE — Progress Notes (Signed)
Remote pacemaker transmission.   

## 2022-12-07 ENCOUNTER — Ambulatory Visit: Payer: Medicare Other | Admitting: Pulmonary Disease

## 2022-12-07 ENCOUNTER — Encounter: Payer: Self-pay | Admitting: Pulmonary Disease

## 2022-12-07 VITALS — BP 104/60 | HR 60 | Temp 97.8°F | Ht 63.5 in | Wt 163.4 lb

## 2022-12-07 DIAGNOSIS — J4530 Mild persistent asthma, uncomplicated: Secondary | ICD-10-CM

## 2022-12-07 MED ORDER — BREZTRI AEROSPHERE 160-9-4.8 MCG/ACT IN AERO: 2.0000 | INHALATION_SPRAY | Freq: Two times a day (BID) | RESPIRATORY_TRACT | Status: DC

## 2022-12-07 MED ORDER — MOMETASONE FURO-FORMOTEROL FUM 100-5 MCG/ACT IN AERO
2.0000 | INHALATION_SPRAY | Freq: Two times a day (BID) | RESPIRATORY_TRACT | 11 refills | Status: DC
Start: 2022-12-07 — End: 2023-03-13

## 2022-12-07 NOTE — Addendum Note (Signed)
Addended by: Charlott Holler on: 12/07/2022 12:08 PM   Modules accepted: Orders

## 2022-12-07 NOTE — Progress Notes (Signed)
Synopsis: Referred in March 2023 for Asthma by Payton Emerald, MD  Subjective:   PATIENT ID: Jessica Carpenter GENDER: female DOB: 1950/04/11, MRN: [id number]  HPI  Chief Complaint  Patient presents with   Follow-up    Increased sob, cough-dry, occass. Wheezing at night occass.   Jessica Carpenter is a 72 year old woman, never smoker with GERD, hypertension, complete heart block s/p pacemaker and DMII who returns to pulmonary clinic for asthma.   Initial OV 03/18/21 She was last seen in clinic in 2016 by Dr. Delton Coombes. PFTs showed mild obstruction based on flow volume loop. She was being treated with advair 250-92mcg 1 puff twice daily at that time. She was also being treated for GERD and allergies at that time as aggravating factors of her asthma.   She was using symbicort as needed before. She reports it was making her chest and throat sore. She reports episode of respiratory distress where she lost consciousness after coughing and losing her breath. She reports coughing and passing out. PCP changed her to pulmicort inhaler which does not hurt her chest or throat.   She experiences occasoinal dyspnea, cough and wheezing. She notices the dyspnea and wheezing at night sometimes that is relieved by albuterol.   She does report spring allergies.   She is a never smoker. She does report second hand smoke from her father.   OV 03/18/21 PFTs 09/14/21 are within normal limits.  She has been using budesonide 2 puffs twice daily and as needed albuterol. She is using albuterol about once daily. She is on Singulair daily. She is using flonase/atrovent nasal sprays.   She has increased sinus congestion and chest congestion. She is having harder time coughing up chest congestion. She hasn't had bronchitis in the past 2 years.   OV 12/07/22 She presents with concerns about the high cost of their inhalers. They report that their insurance, Medicaid, recently stopped covering the cost of their  inhalers, which they now find unaffordable. The patient is currently using an inhaler they found in their drawer, which is due to expire in April of the following year which is symbicort . They express fear about running out of their inhaler and not being able to breathe properly. She has intermittent wheezing and cough. ACT 12.  Past Medical History:  Diagnosis Date   Allergic rhinitis    Arthritis    "left leg" (02/04/2013)   Asthma    CHF (congestive heart failure) (HCC)    Chronic bronchitis (HCC)    "usually get it q yr" (02/04/2013)   GERD (gastroesophageal reflux disease)    Glaucoma    Gout    "used to; haven't had it lately" (02/04/2013)   Hypertension    Pacemaker    PONV (postoperative nausea and vomiting)    Sleep apnea    Symptomatic bradycardia    s/p STJ dual chamber pacemaker by Dr Ladona Ridgel 02/2013   Type II diabetes mellitus (HCC)      Family History  Problem Relation Age of Onset   Breast cancer Mother    Emphysema Father    Colon polyps Neg Hx    Colon cancer Neg Hx    Esophageal cancer Neg Hx    Rectal cancer Neg Hx    Stomach cancer Neg Hx      Social History   Socioeconomic History   Marital status: Divorced    Spouse name: Not on file   Number of children: Not on file  Years of education: Not on file   Highest education level: Not on file  Occupational History   Occupation: disabled   Tobacco Use   Smoking status: Never   Smokeless tobacco: Never  Vaping Use   Vaping status: Never Used  Substance and Sexual Activity   Alcohol use: Not Currently    Comment: occas   Drug use: No   Sexual activity: Not on file  Other Topics Concern   Not on file  Social History Narrative   Not on file   Social Determinants of Health   Financial Resource Strain: Not at Risk (11/23/2022)   Received from [employer]    Financial Resource Strain: 1  Food Insecurity: Not at Risk (11/23/2022)   Received from [facility] Insecurity     Food: 1  Transportation Needs: Not at Risk (11/23/2022)   Received from [employer] [facility] Needs    Transportation: 1  Physical Activity: Not at Risk (11/23/2022)   Received from [facility]   Physical Activity    Physical Activity: 1  Stress: Not at Risk (11/23/2022)   Received from [facility]   Stress    Stress: 1  Social Connections: Not at Risk (11/23/2022)   Received from [facility]   Social Connections    Connectedness: 1  Intimate Partner Violence: Not At Risk (08/13/2022)   Humiliation, Afraid, Rape, and Kick questionnaire    Fear of Current or Ex-Partner: No    Emotionally Abused: No    Physically Abused: No    Sexually Abused: No     Allergies  Allergen Reactions   Moxifloxacin Hives, Shortness Of Breath and Other (See Comments)    Caused syncope and throat tightness   Penicillins Hives, Shortness Of Breath and Other (See Comments)    Caused syncope and throat tightness Has patient had a PCN reaction causing immediate rash, facial/tongue/throat swelling, SOB or lightheadedness with hypotension: Yes Has patient had a PCN reaction causing severe rash involving mucus membranes or skin necrosis: No Has patient had a PCN reaction that required hospitalization: hospital visit but not overnight Has patient had a PCN reaction occurring within the last 10 years: No If all of the above answers are "NO", then may proceed with Cephalosporin   Arimidex [Anastrozole]      Outpatient Medications Prior to Visit  Medication Sig Dispense Refill   albuterol (VENTOLIN HFA) 108 (90 Base) MCG/ACT inhaler Inhale 1-2 puffs into the lungs every 4 (four) hours as needed for wheezing or shortness of breath. 1 each 6   amLODipine (NORVASC) 10 MG tablet Take 10 mg by mouth daily.     aspirin EC 81 MG tablet Take 81 mg by mouth daily.     fluticasone (FLONASE) 50 MCG/ACT nasal spray Place 1 spray into both nostrils daily. 16 g 11   hydrochlorothiazide (HYDRODIURIL) 25 MG tablet Take 25 mg by mouth  daily.      hydroquinone 4 % cream Apply 1 g topically every morning.     latanoprost (XALATAN) 0.005 % ophthalmic solution Place 1 drop into both eyes at bedtime.     losartan (COZAAR) 100 MG tablet Take 100 mg by mouth daily.     lovastatin (MEVACOR) 10 MG tablet Take 10 mg by mouth daily with supper.     metFORMIN (GLUCOPHAGE) 1000 MG tablet Take 1,000 mg by mouth daily with breakfast.     nystatin (MYCOSTATIN) 100000 UNIT/ML suspension SMARTSIG:4 Milliliter(s) By Mouth 4 Times Daily 60  mL 6   omeprazole (PRILOSEC) 20 MG capsule TAKE 1 CAPSULE (20 MG TOTAL) BY MOUTH 2 (TWO) TIMES DAILY BEFORE A MEAL. (Patient taking differently: Take 20 mg by mouth daily.) 180 capsule 3   Polyethyl Glycol-Propyl Glycol (SYSTANE OP) Place 1 drop into both eyes daily as needed (dry eyes).     Spacer/Aero-Holding Chambers (AEROCHAMBER MV) inhaler Use as instructed 1 each 0   fluticasone-salmeterol (ADVAIR) 100-50 MCG/ACT AEPB Inhale 1 puff into the lungs 2 (two) times daily. (Patient not taking: Reported on 12/07/2022)     No facility-administered medications prior to visit.   Review of Systems  Constitutional:  Negative for chills, fever, malaise/fatigue and weight loss.  HENT:  Negative for congestion, sinus pain and sore throat.   Eyes: Negative.   Respiratory:  Positive for cough, shortness of breath and wheezing. Negative for hemoptysis and sputum production.   Cardiovascular:  Negative for chest pain, palpitations, orthopnea, claudication and leg swelling.  Gastrointestinal:  Negative for abdominal pain, heartburn, nausea and vomiting.  Genitourinary: Negative.   Musculoskeletal:  Negative for joint pain and myalgias.  Skin:  Negative for rash.  Neurological:  Negative for weakness.  Endo/Heme/Allergies: Negative.   Psychiatric/Behavioral: Negative.      Objective:   Vitals:   12/07/22 0841  BP: 104/60  Pulse: 60  Temp: 97.8 F (36.6 C)  TempSrc: Temporal  SpO2: 99%  Weight: 163 lb 6.4 oz  (74.1 kg)  Height: 5' 3.5" (1.613 m)     Physical Exam Constitutional:      General: She is not in acute distress.    Appearance: She is not ill-appearing.  HENT:     Head: Normocephalic and atraumatic.  Eyes:     General: No scleral icterus. Cardiovascular:     Rate and Rhythm: Normal rate and regular rhythm.     Pulses: Normal pulses.     Heart sounds: Normal heart sounds. No murmur heard. Pulmonary:     Effort: Pulmonary effort is normal.     Breath sounds: Normal breath sounds. No wheezing, rhonchi or rales.  Musculoskeletal:     Right lower leg: No edema.     Left lower leg: No edema.  Skin:    General: Skin is warm and dry.  Neurological:     General: No focal deficit present.     Mental Status: She is alert.    CBC    Component Value Date/Time   WBC 6.1 10/19/2022 0840   RBC 4.98 10/19/2022 0840   HGB 14.6 10/19/2022 0925   HCT 43.0 10/19/2022 0925   PLT 330 10/19/2022 0840   MCV 86.5 10/19/2022 0840   MCH 27.1 10/19/2022 0840   MCHC 31.3 10/19/2022 0840   RDW 14.6 10/19/2022 0840   LYMPHSABS 4.9 (H) 08/13/2022 0459   MONOABS 0.7 08/13/2022 0459   EOSABS 0.1 08/13/2022 0459   BASOSABS 0.1 08/13/2022 0459      Latest Ref Rng & Units 10/19/2022    9:25 AM 08/14/2022    2:43 AM 08/13/2022    5:05 AM  BMP  Glucose 70 - 99 mg/dL 99  161  096   BUN 8 - 23 mg/dL 21  18  19    Creatinine 0.44 - 1.00 mg/dL 0.45  4.09  8.11   Sodium 135 - 145 mmol/L 143  138  140   Potassium 3.5 - 5.1 mmol/L 3.7  3.6  3.9   Chloride 98 - 111 mmol/L 107  103  107  CO2 22 - 32 mmol/L  25    Calcium 8.9 - 10.3 mg/dL  8.9     Chest imaging: CXR 11/23/2017 LEFT-sided transvenous pacemaker with leads to the RIGHT atrium and RIGHT ventricle. The heart size is normal. There are no focal consolidations. No pleural effusions or edema.  PFT:    Latest Ref Rng & Units 09/14/2021   10:14 AM 07/30/2014    3:34 PM  PFT Results  FVC-Pre L 2.30  2.19   FVC-Predicted Pre % 75  83    FVC-Post L 2.31  2.24   FVC-Predicted Post % 75  85   Pre FEV1/FVC % % 83  83   Post FEV1/FCV % % 86  85   FEV1-Pre L 1.91  1.81   FEV1-Predicted Pre % 83  88   FEV1-Post L 1.98  1.89   DLCO uncorrected ml/min/mmHg 15.97  10.97   DLCO UNC% % 79  42   DLCO corrected ml/min/mmHg 15.97    DLCO COR %Predicted % 79    DLVA Predicted % 109  100   TLC L 4.57    TLC % Predicted % 87    RV % Predicted % 98     Labs:  Path:  Echo 2015: LV EF 55-60%. Grade I diastolic dysfunction.   Heart Catheterization:  Assessment & Plan:   No diagnosis found.  Discussion: Yahir Solorsano is a 72 year old woman, never smoker with GERD, hypertension, complete heart block s/p pacemaker and DMII who returns to pulmonary clinic for asthma.   Asthma Patient reports difficulty affording inhalers due to insurance coverage gap ("donut hole"). Currently has one inhaler left, which expires in April 2025. -Provide samples of Breztri for immediate use. -Send prescription for [facility] to pharmacy and advise patient to check cost. -Advise patient to use current inhaler until it expires in April 2025. -Check insurance coverage for inhalers in the new year.  Follow up in 1 year.  Melody Comas, MD [facility] Office: [phone]   Current Outpatient Medications:    albuterol (VENTOLIN HFA) 108 (90 Base) MCG/ACT inhaler, Inhale 1-2 puffs into the lungs every 4 (four) hours as needed for wheezing or shortness of breath., Disp: 1 each, Rfl: 6   amLODipine (NORVASC) 10 MG tablet, Take 10 mg by mouth daily., Disp: , Rfl:    aspirin EC 81 MG tablet, Take 81 mg by mouth daily., Disp: , Rfl:    fluticasone (FLONASE) 50 MCG/ACT nasal spray, Place 1 spray into both nostrils daily., Disp: 16 g, Rfl: 11   hydrochlorothiazide (HYDRODIURIL) 25 MG tablet, Take 25 mg by mouth daily. , Disp: , Rfl:    hydroquinone 4 % cream, Apply 1 g topically every morning., Disp: , Rfl:    latanoprost (XALATAN)  0.005 % ophthalmic solution, Place 1 drop into both eyes at bedtime., Disp: , Rfl:    losartan (COZAAR) 100 MG tablet, Take 100 mg by mouth daily., Disp: , Rfl:    lovastatin (MEVACOR) 10 MG tablet, Take 10 mg by mouth daily with supper., Disp: , Rfl:    metFORMIN (GLUCOPHAGE) 1000 MG tablet, Take 1,000 mg by mouth daily with breakfast., Disp: , Rfl:    nystatin (MYCOSTATIN) 100000 UNIT/ML suspension, SMARTSIG:4 Milliliter(s) By Mouth 4 Times Daily, Disp: 60 mL, Rfl: 6   omeprazole (PRILOSEC) 20 MG capsule, TAKE 1 CAPSULE (20 MG TOTAL) BY MOUTH 2 (TWO) TIMES DAILY BEFORE A MEAL. (Patient taking differently: Take 20 mg by mouth daily.),  Disp: 180 capsule, Rfl: 3   Polyethyl Glycol-Propyl Glycol (SYSTANE OP), Place 1 drop into both eyes daily as needed (dry eyes)., Disp: , Rfl:    Spacer/Aero-Holding Chambers (AEROCHAMBER MV) inhaler, Use as instructed, Disp: 1 each, Rfl: 0   fluticasone-salmeterol (ADVAIR) 100-50 MCG/ACT AEPB, Inhale 1 puff into the lungs 2 (two) times daily. (Patient not taking: Reported on 12/07/2022), Disp: , Rfl:

## 2022-12-07 NOTE — Patient Instructions (Signed)
Start dulera inhaler 2 puffs twice daily - rinse mouth out after each use  Use the inhaler that you currently have (fluticasone-salmeterol), 2 puffs twice daily - rinse mouth out after each use  Use albuterol inhaler 1-2 puffs every 4-6 hours.  We will give you have sample of breztri today incase you have issues picking up an inhaler until next year  Follow up in 1 year, call sooner if needed

## 2023-01-05 ENCOUNTER — Other Ambulatory Visit: Payer: Self-pay | Admitting: Family

## 2023-01-05 DIAGNOSIS — M810 Age-related osteoporosis without current pathological fracture: Secondary | ICD-10-CM

## 2023-01-22 ENCOUNTER — Emergency Department (HOSPITAL_COMMUNITY): Payer: Medicare Other

## 2023-01-22 ENCOUNTER — Emergency Department (HOSPITAL_COMMUNITY)
Admission: EM | Admit: 2023-01-22 | Discharge: 2023-01-22 | Disposition: A | Discharge status: Home / Self Care | Payer: Medicare Other | Attending: Emergency Medicine | Admitting: Emergency Medicine

## 2023-01-22 ENCOUNTER — Other Ambulatory Visit: Payer: Self-pay

## 2023-01-22 ENCOUNTER — Encounter (HOSPITAL_COMMUNITY): Payer: Self-pay

## 2023-01-22 DIAGNOSIS — Z7982 Long term (current) use of aspirin: Secondary | ICD-10-CM | POA: Insufficient documentation

## 2023-01-22 DIAGNOSIS — I110 Hypertensive heart disease with heart failure: Secondary | ICD-10-CM | POA: Insufficient documentation

## 2023-01-22 DIAGNOSIS — G4733 Obstructive sleep apnea (adult) (pediatric): Secondary | ICD-10-CM | POA: Diagnosis not present

## 2023-01-22 DIAGNOSIS — Z79899 Other long term (current) drug therapy: Secondary | ICD-10-CM | POA: Insufficient documentation

## 2023-01-22 DIAGNOSIS — Z7984 Long term (current) use of oral hypoglycemic drugs: Secondary | ICD-10-CM | POA: Diagnosis not present

## 2023-01-22 DIAGNOSIS — R569 Unspecified convulsions: Secondary | ICD-10-CM | POA: Insufficient documentation

## 2023-01-22 DIAGNOSIS — Z950 Presence of cardiac pacemaker: Secondary | ICD-10-CM | POA: Diagnosis not present

## 2023-01-22 DIAGNOSIS — Z20822 Contact with and (suspected) exposure to covid-19: Secondary | ICD-10-CM | POA: Insufficient documentation

## 2023-01-22 DIAGNOSIS — I509 Heart failure, unspecified: Secondary | ICD-10-CM | POA: Insufficient documentation

## 2023-01-22 DIAGNOSIS — J0191 Acute recurrent sinusitis, unspecified: Secondary | ICD-10-CM | POA: Insufficient documentation

## 2023-01-22 DIAGNOSIS — E785 Hyperlipidemia, unspecified: Secondary | ICD-10-CM | POA: Diagnosis not present

## 2023-01-22 DIAGNOSIS — R4182 Altered mental status, unspecified: Secondary | ICD-10-CM | POA: Diagnosis not present

## 2023-01-22 DIAGNOSIS — J45909 Unspecified asthma, uncomplicated: Secondary | ICD-10-CM | POA: Diagnosis not present

## 2023-01-22 DIAGNOSIS — Z8673 Personal history of transient ischemic attack (TIA), and cerebral infarction without residual deficits: Secondary | ICD-10-CM | POA: Insufficient documentation

## 2023-01-22 LAB — COMPREHENSIVE METABOLIC PANEL
ALT: 13 U/L (ref 0–44)
AST: 20 U/L (ref 15–41)
Albumin: 3.5 g/dL (ref 3.5–5.0)
Alkaline Phosphatase: 87 U/L (ref 38–126)
Anion gap: 15 (ref 5–15)
BUN: 17 mg/dL (ref 8–23)
CO2: 18 mmol/L — ABNORMAL LOW (ref 22–32)
Calcium: 9.3 mg/dL (ref 8.9–10.3)
Chloride: 105 mmol/L (ref 98–111)
Creatinine, Ser: 1.21 mg/dL — ABNORMAL HIGH (ref 0.44–1.00)
GFR, Estimated: 47 mL/min — ABNORMAL LOW (ref >60)
Glucose, Bld: 131 mg/dL — ABNORMAL HIGH (ref 70–99)
Potassium: 3.9 mmol/L (ref 3.5–5.1)
Sodium: 138 mmol/L (ref 135–145)
Total Bilirubin: 0.6 mg/dL (ref 0.0–1.2)
Total Protein: 6.7 g/dL (ref 6.5–8.1)

## 2023-01-22 LAB — RAPID URINE DRUG SCREEN, HOSP PERFORMED
Amphetamines: NOT DETECTED
Barbiturates: NOT DETECTED
Benzodiazepines: NOT DETECTED
Cocaine: NOT DETECTED
Opiates: NOT DETECTED
Tetrahydrocannabinol: NOT DETECTED

## 2023-01-22 LAB — CBC WITH DIFFERENTIAL/PLATELET
Abs Immature Granulocytes: 0.01 10*3/uL (ref 0.00–0.07)
Basophils Absolute: 0.1 10*3/uL (ref 0.0–0.1)
Basophils Relative: 1 %
Eosinophils Absolute: 0.3 10*3/uL (ref 0.0–0.5)
Eosinophils Relative: 5 %
HCT: 39.5 % (ref 36.0–46.0)
Hemoglobin: 12.6 g/dL (ref 12.0–15.0)
Immature Granulocytes: 0 %
Lymphocytes Relative: 49 %
Lymphs Abs: 3.1 10*3/uL (ref 0.7–4.0)
MCH: 27 pg (ref 26.0–34.0)
MCHC: 31.9 g/dL (ref 30.0–36.0)
MCV: 84.8 fL (ref 80.0–100.0)
Monocytes Absolute: 0.8 10*3/uL (ref 0.1–1.0)
Monocytes Relative: 13 %
Neutro Abs: 2 10*3/uL (ref 1.7–7.7)
Neutrophils Relative %: 32 %
Platelets: 252 10*3/uL (ref 150–400)
RBC: 4.66 MIL/uL (ref 3.87–5.11)
RDW: 14.2 % (ref 11.5–15.5)
WBC: 6.2 10*3/uL (ref 4.0–10.5)
nRBC: 0 % (ref 0.0–0.2)

## 2023-01-22 LAB — URINALYSIS, ROUTINE W REFLEX MICROSCOPIC
Bilirubin Urine: NEGATIVE
Glucose, UA: NEGATIVE mg/dL
Hgb urine dipstick: NEGATIVE
Ketones, ur: NEGATIVE mg/dL
Leukocytes,Ua: NEGATIVE
Nitrite: NEGATIVE
Protein, ur: NEGATIVE mg/dL
Specific Gravity, Urine: 1.011 (ref 1.005–1.030)
pH: 5 (ref 5.0–8.0)

## 2023-01-22 LAB — MAGNESIUM: Magnesium: 2.1 mg/dL (ref 1.7–2.4)

## 2023-01-22 LAB — RESP PANEL BY RT-PCR (RSV, FLU A&B, COVID)  RVPGX2
Influenza A by PCR: NEGATIVE
Influenza B by PCR: NEGATIVE
Resp Syncytial Virus by PCR: NEGATIVE
SARS Coronavirus 2 by RT PCR: NEGATIVE

## 2023-01-22 LAB — I-STAT CG4 LACTIC ACID, ED: Lactic Acid, Venous: 1.7 mmol/L (ref 0.5–1.9)

## 2023-01-22 LAB — ETHANOL: Alcohol, Ethyl (B): <10 mg/dL (ref <10)

## 2023-01-22 LAB — CBG MONITORING, ED: Glucose-Capillary: 118 mg/dL — ABNORMAL HIGH (ref 70–99)

## 2023-01-22 LAB — CK: Total CK: 54 U/L (ref 38–234)

## 2023-01-22 MED ORDER — LEVETIRACETAM 250 MG PO TABS
250.0000 mg | ORAL_TABLET | Freq: Two times a day (BID) | ORAL | 2 refills | Status: DC
Start: 2023-01-22 — End: 2023-08-17

## 2023-01-22 MED ORDER — AZITHROMYCIN 250 MG PO TABS: 250.0000 mg | ORAL_TABLET | Freq: Every day | ORAL | 0 refills | Status: DC

## 2023-01-22 MED ORDER — LEVETIRACETAM 250 MG PO TABS
250.0000 mg | ORAL_TABLET | Freq: Once | ORAL | Status: AC
  Administered 2023-01-22: 250 mg via ORAL
  Filled 2023-01-22: qty 1

## 2023-01-22 NOTE — ED Triage Notes (Signed)
Pt coming in from home where room mate reported seizure like activity then period of snoring resp no history of seizures. Pt was then only responsive to verbal stimuli. Negative for stroke screen from ems. Pt has a pacemaker.   Ems vitals 122/84 Hr 80 Spo2 98%  Cbg 134 Rr 20  Etco2 36

## 2023-01-22 NOTE — ED Notes (Signed)
I asked pt he she could get a UA sample she stated that she went not long ago. I told her we can try again later.

## 2023-01-22 NOTE — ED Provider Notes (Signed)
[place] EMERGENCY DEPARTMENT AT [facility] Provider Note   CSN: [id number] Arrival date & time: 01/22/23  8657     History  Chief Complaint  Patient presents with   Altered Mental Status    Jessica Carpenter is a 73 y.o. female with complete heart block status post STJ dual chamber pacemaker, T2DM, asthma, history of TIA, history of cocaine abuse, HTN, HLD, OSA who presents with AMS.  Patient presents with her friend at bedside who provides additional history.  They were sleeping when the friend noticed that patient was shaking in her sleep.  Had a period of snoring respirations and full body shaking during which time he was unable to arouse her.  This lasted approximately 10 minutes.  Then she had approximately 15 minutes of altered mental status before she was back to normal.  Patient does not remember what happened.  States that she feels generally weak now but otherwise feels okay.  Did not have any loss of bladder or bowel.  She had approximately 1 week of facial pressure and "sinus headache" that she went to her primary care physician for and was diagnosed with sinusitis and given doxycycline.  Has not had any fevers chills, other flulike symptoms, chest pain, shortness of breath, nausea vomiting diarrhea constipation, urinary symptoms, leg swelling.  Did have a similar episode in 2024 for which she was evaluated in August 2024.  Per chart review, patient was admitted for possible transient ischemic attack after presenting with a~5-minute episode of unresponsiveness and facial asymmetry with spontaneous resolution of symptoms followed by period of generalized weakness.  Code stroke was called in the ED.  Neurology team felt acute stroke unlikely, and possible undiagnosed sleep apnea was more likely explanation.  Patient has no known history of seizures or syncope.  Only recent change in medication was a decrease in her metformin dose.  Patient denies any alcohol or drugs.   Past  Medical History:  Diagnosis Date   Allergic rhinitis    Arthritis    "left leg" (02/04/2013)   Asthma    CHF (congestive heart failure) (HCC)    Chronic bronchitis (HCC)    "usually get it q yr" (02/04/2013)   GERD (gastroesophageal reflux disease)    Glaucoma    Gout    "used to; haven't had it lately" (02/04/2013)   Hypertension    Pacemaker    PONV (postoperative nausea and vomiting)    Sleep apnea    Symptomatic bradycardia    s/p STJ dual chamber pacemaker by Dr Ladona Ridgel 02/2013   Type II diabetes mellitus (HCC)        Home Medications Prior to Admission medications   Medication Sig Start Date End Date Taking? Authorizing Provider  azithromycin (ZITHROMAX) 250 MG tablet Take 1 tablet (250 mg total) by mouth daily. Take first 2 tablets together, then 1 every day until finished. 01/22/23  Yes Loetta Rough, MD  levETIRAcetam (KEPPRA) 250 MG tablet Take 1 tablet (250 mg total) by mouth 2 (two) times daily. 01/22/23 02/21/23 Yes Loetta Rough, MD  albuterol (VENTOLIN HFA) 108 (90 Base) MCG/ACT inhaler Inhale 1-2 puffs into the lungs every 4 (four) hours as needed for wheezing or shortness of breath. 09/27/21   Martina Sinner, MD  amLODipine (NORVASC) 10 MG tablet Take 10 mg by mouth daily.    [provider]  aspirin EC 81 MG tablet Take 81 mg by mouth daily.    [provider]  Budeson-Glycopyrrol-Formoterol (  BREZTRI AEROSPHERE) 160-9-4.8 MCG/ACT AERO Inhale 2 puffs into the lungs in the morning and at bedtime. 12/07/22   Martina Sinner, MD  fluticasone (FLONASE) 50 MCG/ACT nasal spray Place 1 spray into both nostrils daily. 09/27/21   Martina Sinner, MD  hydrochlorothiazide (HYDRODIURIL) 25 MG tablet Take 25 mg by mouth daily.     [provider]  hydroquinone 4 % cream Apply 1 g topically every morning. 07/20/22   [provider]  latanoprost (XALATAN) 0.005 % ophthalmic solution Place 1 drop into both eyes at bedtime. 07/12/22   [provider]  losartan (COZAAR) 100 MG tablet Take 100 mg by mouth daily.    [provider]  lovastatin (MEVACOR) 10 MG tablet Take 10 mg by mouth daily with supper. 03/15/20   [provider]  metFORMIN (GLUCOPHAGE) 1000 MG tablet Take 1,000 mg by mouth daily with breakfast. 12/23/20   [provider]  mometasone-formoterol (DULERA) 100-5 MCG/ACT AERO Inhale 2 puffs into the lungs 2 (two) times daily. 12/07/22   Martina Sinner, MD  nystatin (MYCOSTATIN) 100000 UNIT/ML suspension SMARTSIG:4 Milliliter(s) By Mouth 4 Times Daily 03/18/21   Martina Sinner, MD  omeprazole (PRILOSEC) 20 MG capsule TAKE 1 CAPSULE (20 MG TOTAL) BY MOUTH 2 (TWO) TIMES DAILY BEFORE A MEAL. Patient taking differently: Take 20 mg by mouth daily. 06/13/22   Marinus Maw, MD  Polyethyl Glycol-Propyl Glycol (SYSTANE OP) Place 1 drop into both eyes daily as needed (dry eyes).    [provider]  Spacer/Aero-Holding Chambers (AEROCHAMBER MV) inhaler Use as instructed 09/27/21   Martina Sinner, MD      Allergies    Moxifloxacin, Penicillins, and Arimidex [anastrozole]    Review of Systems   Review of Systems A 10 point review of systems was performed and is negative unless otherwise reported in HPI.  Physical Exam Updated Vital Signs BP (!) 149/78   Pulse 72   Temp (!) 97.4 F (36.3 C) (Oral)   Resp (!) 31   Ht 5\' 4"  (1.626 m)   Wt 74.4 kg   SpO2 100%   BMI 28.15 kg/m  Physical Exam General: Normal appearing female, lying in bed.  HEENT: PERRLA, EOMI, no nystagmus, Sclera anicteric, MMM, trachea midline. Tongue protrudes midline. Normal facial symmetry.  Cardiology: RRR, no murmurs/rubs/gallops. Resp: Normal respiratory rate and effort. CTAB, no wheezes, rhonchi, crackles.  Abd: Soft, non-tender, non-distended. No rebound tenderness or guarding.  GU: Deferred. MSK: No peripheral edema or signs of trauma. Extremities without deformity or TTP. No cyanosis or  clubbing. Skin: warm, dry Neuro: A&Ox4, CNs II-XII grossly intact. 5/5 strength all extremities. Sensation grossly intact.  Psych: Normal mood and affect.   1a  Level of consciousness: 0=alert; keenly responsive  1b. LOC questions:  0=Performs both tasks correctly  1c. LOC commands: 0=Performs both tasks correctly  2.  Best Gaze: 0=normal  3.  Visual: 0=No visual loss  4. Facial Palsy: 0=Normal symmetric movement  5a.  Motor left arm: 0=No drift, limb holds 90 (or 45) degrees for full 10 seconds  5b.  Motor right arm: 0=No drift, limb holds 90 (or 45) degrees for full 10 seconds  6a. motor left leg: 0=No drift, limb holds 90 (or 45) degrees for full 10 seconds  6b  Motor right leg:  0=No drift, limb holds 90 (or 45) degrees for full 10 seconds  7. Limb Ataxia: 0=Absent  8.  Sensory: 0=Normal; no sensory loss  9. Best  Language:  0=No aphasia, normal  10. Dysarthria: 0=Normal  11. Extinction and Inattention: 0=No abnormality   Total:   0        ED Results / Procedures / Treatments   Labs (all labs ordered are listed, but only abnormal results are displayed) Labs Reviewed  COMPREHENSIVE METABOLIC PANEL - Abnormal; Notable for the following components:      Result Value   CO2 18 (*)    Glucose, Bld 131 (*)    Creatinine, Ser 1.21 (*)    GFR, Estimated 47 (*)    All other components within normal limits  CBG MONITORING, ED - Abnormal; Notable for the following components:   Glucose-Capillary 118 (*)    All other components within normal limits  RESP PANEL BY RT-PCR (RSV, FLU A&B, COVID)  RVPGX2  CBC WITH DIFFERENTIAL/PLATELET  MAGNESIUM  URINALYSIS, ROUTINE W REFLEX MICROSCOPIC  RAPID URINE DRUG SCREEN, HOSP PERFORMED  CK  ETHANOL  I-STAT CG4 LACTIC ACID, ED    EKG EKG Interpretation Date/Time:  Sunday January 22 2023 06:58:34 EST Ventricular Rate:  70 PR Interval:  266 QRS Duration:  140 QT Interval:  428 QTC Calculation: 462 R Axis:   20  Text  Interpretation: ATRIAL PACED RHYTHM Similar to prior EKG Confirmed by Vivi Barrack [phone]) on 01/22/2023 7:58:28 AM  Radiology CT Head Wo Contrast Result Date: 01/22/2023 CLINICAL DATA:  Seizure, new onset, no history of trauma. EXAM: CT HEAD WITHOUT CONTRAST TECHNIQUE: Contiguous axial images were obtained from the base of the skull through the vertex without intravenous contrast. RADIATION DOSE REDUCTION: This exam was performed according to the departmental dose-optimization program which includes automated exposure control, adjustment of the mA and/or kV according to patient size and/or use of iterative reconstruction technique. COMPARISON:  08/13/2022 FINDINGS: Brain: No evidence of acute infarction, hemorrhage, hydrocephalus, extra-axial collection or mass lesion/mass effect. Brain volume is normal for age. No cortical finding to correlate with seizure history. Vascular: Elongated basilar artery. Skull: Normal. Negative for fracture or focal lesion. Sinuses/Orbits: No acute finding IMPRESSION: No acute or interval finding. Electronically Signed   By: Tiburcio Pea M.D.   On: 01/22/2023 08:51    Procedures Procedures    Medications Ordered in ED Medications  levETIRAcetam (KEPPRA) tablet 250 mg (250 mg Oral Given 01/22/23 1209)    ED Course/ Medical Decision Making/ A&P                          Medical Decision Making Amount and/or Complexity of Data Reviewed Labs: ordered. Decision-making details documented in ED Course. Radiology: ordered. Decision-making details documented in ED Course.  Risk Prescription drug management.    This patient presents to the ED for concern of shaking/unresponsive episode, this involves an extensive number of treatment options, and is a complaint that carries with it a high risk of complications and morbidity.  I considered the following differential and admission for this acute, potentially life threatening condition.   MDM:    Greatest degree of  concern at this time for a seizure-like episode, given unresponsive full body shaking followed by a period of altered mental status now resolved.  On chart review it seems that a similar episode last year had also reported right facial weakness and she was ultimately diagnosed with a TIA-like episode. Consider electrolyte derangements, anemia, hypo-/hyperglycemia especially given recent change in metformin dose.  She reportedly was recently diagnosed with sinusitis and does report a "sinus headache" and purulent  nasal discharge this week, however she is afebrile with no nuchal rigidity to indicate meningitis.  She does have a pacemaker and a history of complete heart block, could have had convulsive syncope, but EKG does not demonstrate arrhythmia/ischemia, and patient has no chest pain palpitations or shortness of breath to indicate ACS, arrhythmia, pulmonary embolism. D/w neurology and per chart review, prior episode was thought to potentially be due to hypoxia in s/o sleep apnea and she was recommended a sleep study, which she states she forgot to go get. Possible that this episode too could be related to sleep apnea and associated hypoxia, with snoring respirations noted. She is not hypoxic here and has no resp distress. Recommended f/u with sleep study as originally recommended.  Clinical Course as of 01/22/23 1614  Sun Jan 22, 2023  0757 Glucose-Capillary(!): 118 unremarkable [HN]  0757 CBC with Differential No leukocytosis, no anemia.  [HN]  0757 CO2(!): 18 Mildly decreased bicarb, otherwise CMP unremarakble [HN]  0757 Magnesium: 2.1 wnl [HN]  0906 CT Head Wo Contrast No acute or interval finding.  [HN]  0937 Lactic Acid, Venous: 1.7 wnl [HN]  0937 CK Total: 54 wnl [HN]  1143 D/w Dr. Iver Nestle w/ neurology. Recommended starting keppra 250 mg BID. Cannot get MRI d/t pacemaker being incompatible. Recommends seizure precautions in the meantime and f/u o/p with neurology. Also recommends getting  sleep study which was recommended at prior admission. [HN]  1206 Patient is also asking for additional treatment for sinus infection. She already took doxycycline which did not help and sxs returned. She has allergy to penicillin and no documented toleration of cephalosporin.  [HN]  1214 D/w pharmacy who recommends azithromycin. Will prescribe.  [HN]    Clinical Course User Index [HN] Loetta Rough, MD    Labs: I Ordered, and personally interpreted labs.  The pertinent results include:  those listed above  Imaging Studies ordered: I ordered imaging studies including CTH I independently visualized and interpreted imaging. I agree with the radiologist interpretation  Additional history obtained from chart review, friend at bedside.   Cardiac Monitoring: The patient was maintained on a cardiac monitor.  I personally viewed and interpreted the cardiac monitored which showed an underlying rhythm of: NSR  Reevaluation: I reevaluated the patient and found that they have :resolved  Social Determinants of Health: Lives independently  Disposition:  DC w/ discharge instructions/return precautions. All questions answered to patient's satisfaction.    Co morbidities that complicate the patient evaluation  Past Medical History:  Diagnosis Date   Allergic rhinitis    Arthritis    "left leg" (02/04/2013)   Asthma    CHF (congestive heart failure) (HCC)    Chronic bronchitis (HCC)    "usually get it q yr" (02/04/2013)   GERD (gastroesophageal reflux disease)    Glaucoma    Gout    "used to; haven't had it lately" (02/04/2013)   Hypertension    Pacemaker    PONV (postoperative nausea and vomiting)    Sleep apnea    Symptomatic bradycardia    s/p STJ dual chamber pacemaker by Dr Ladona Ridgel 02/2013   Type II diabetes mellitus (HCC)      Medicines Meds ordered this encounter  Medications   levETIRAcetam (KEPPRA) tablet 250 mg   levETIRAcetam (KEPPRA) 250 MG tablet    Sig: Take 1 tablet  (250 mg total) by mouth 2 (two) times daily.    Dispense:  60 tablet    Refill:  2   azithromycin (  ZITHROMAX) 250 MG tablet    Sig: Take 1 tablet (250 mg total) by mouth daily. Take first 2 tablets together, then 1 every day until finished.    Dispense:  6 tablet    Refill:  0    I have reviewed the patients home medicines and have made adjustments as needed  Problem List / ED Course: Problem List Items Addressed This Visit       Respiratory   SINUSITIS, ACUTE   Relevant Medications   azithromycin (ZITHROMAX) 250 MG tablet   Other Visit Diagnoses       Seizure-like activity (HCC)    -  Primary                   This note was created using dictation software, which may contain spelling or grammatical errors.    Loetta Rough, MD 01/22/23 [phone]

## 2023-01-22 NOTE — Progress Notes (Signed)
  These are curbside recommendations based upon the information readily available in the chart on brief review as well as history and examination information provided to me by requesting provider and do not replace a full detailed consult  Asked to comment on management of a third lifetime episode of snoring respirations when sleeping followed by difficulty awakening and some brief confusion after the event.  Evaluated by my colleague (Dr. Otelia Limes) on 08/13/2022 for a similar episode as a code stroke, and at that time to medicine team she reported a similar episode approximately 7 months prior felt that this was most likely related to sleep apnea, but unfortunately MRI brain could not be completed as her pacemaker is not compatible   She is otherwise back to baseline per ED provider.    Assessment/recommendations Given infrequent events and patient back to baseline, I do not think she needs urgent admission for LTM EEG monitoring.  I do think it would be reasonable to start low-dose Keppra 250 mg twice daily, seizure precautions out of an abundance of caution with plan for outpatient full neurology evaluation; seizure medications may be discontinued at that time based on their full evaluation.  Should continue with plans for sleep study as previously recommended by medicine team in August 2024 if not completed   Basic Metabolic Panel: Recent Labs  Lab 01/22/23 0656  NA 138  K 3.9  CL 105  CO2 18*  GLUCOSE 131*  BUN 17  CREATININE 1.21*  CALCIUM 9.3  MG 2.1    CBC: Recent Labs  Lab 01/22/23 0656  WBC 6.2  NEUTROABS 2.0  HGB 12.6  HCT 39.5  MCV 84.8  PLT 252    Coagulation Studies: No results for input(s): "LABPROT", "INR" in the last 72 hours.   Head CT report negative  Current vital signs: BP 128/70   Pulse 60   Temp (!) 97.4 F (36.3 C) (Oral)   Resp 14   Ht 5\' 4"  (1.626 m)   Wt 74.4 kg   SpO2 99%   BMI 28.15 kg/m  Vital signs in last 24 hours: Temp:  [97.4 F  (36.3 C)-97.8 F (36.6 C)] 97.4 F (36.3 C) (01/19 1120) Pulse Rate:  [60-71] 60 (01/19 1000) Resp:  [14-20] 14 (01/19 1000) BP: (108-128)/(58-75) 128/70 (01/19 1000) SpO2:  [96 %-100 %] 99 % (01/19 1000) Weight:  [74.4 kg] 74.4 kg (01/19 0656)  12 minutes of care, majority in direct discussion with Dr. Jearld Fenton who notified patient of neurological consultation

## 2023-01-22 NOTE — Discharge Instructions (Addendum)
Thank you for coming to [facility] Emergency Department. You were seen for seizure-like activity. We did an exam, labs, and imaging, and these showed that based on your story it is possible you had a seizure.  I discussed with neurology who recommended starting a medication to treat seizures called Keppra 250 mg twice per day. Please call [facility] to make a follow up appointment. Until you meet with neurology, please do not drive, operate heavy machinery, soak in a bath tub or body of water, or climb ladders or to height.   Do not hesitate to return to the ED or call 911 if you experience: -Worsening symptoms -Further seizure-like activity -Stroke-like symptoms (asymmetric numbness or tingling, weakness, slurred speech, confusion, facial droop) -Lightheadedness, passing out -Fevers/chills -Anything else that concerns you   Seizure first aid: Do not operate heavy machinery. Avoid heights. Avoid pools of water. Avoid any activity that could be dangerous if you lost consciousness. Consult with your neurologist about when you can resume these activities. In most circumstances, seizures typically last less than 2-3 minutes, and do not require any intervention, besides keeping the patient safe from injury. During the seizure, the patient should be rolled onto their side in hopes of preventing aspiration should vomiting occur. Do not place anything in the patient's mouth.  Should the seizure persist greater than 5 minutes, intervention may be needed to get the seizure to stop and 911 should be called. Also, consider calling 911 for multiple seizures, injuries or if the patient is not improving towards baseline after a seizure.  Discussed state regulations regarding driving restrictions and the need to be free of seizures (that would impair the ability to operate a vehicle safely) for at least six months before returning to driving. The patient is aware that it is their responsibility to  notify the [facility] and to not drive until approved.

## 2023-02-06 ENCOUNTER — Ambulatory Visit (INDEPENDENT_AMBULATORY_CARE_PROVIDER_SITE_OTHER): Payer: Medicare Other

## 2023-02-06 DIAGNOSIS — I442 Atrioventricular block, complete: Secondary | ICD-10-CM | POA: Diagnosis not present

## 2023-02-07 LAB — CUP PACEART REMOTE DEVICE CHECK
Battery Remaining Longevity: 9 mo
Battery Remaining Percentage: 7 %
Battery Voltage: 2.78 V
Brady Statistic AP VP Percent: 51 %
Brady Statistic AP VS Percent: 1 %
Brady Statistic AS VP Percent: 49 %
Brady Statistic AS VS Percent: 1 %
Brady Statistic RA Percent Paced: 51 %
Brady Statistic RV Percent Paced: 99 %
Date Time Interrogation Session: 20250203020016
Implantable Lead Connection Status: 753985
Implantable Lead Connection Status: 753985
Implantable Lead Implant Date: 20150202
Implantable Lead Implant Date: 20150202
Implantable Lead Location: 753859
Implantable Lead Location: 753860
Implantable Pulse Generator Implant Date: 20150202
Lead Channel Impedance Value: 440 Ohm
Lead Channel Impedance Value: 490 Ohm
Lead Channel Pacing Threshold Amplitude: 0.5 V
Lead Channel Pacing Threshold Amplitude: 1 V
Lead Channel Pacing Threshold Pulse Width: 0.4 ms
Lead Channel Pacing Threshold Pulse Width: 0.4 ms
Lead Channel Sensing Intrinsic Amplitude: 3.9 mV
Lead Channel Sensing Intrinsic Amplitude: 4 mV
Lead Channel Setting Pacing Amplitude: 1.25 V
Lead Channel Setting Pacing Amplitude: 2 V
Lead Channel Setting Pacing Pulse Width: 0.4 ms
Lead Channel Setting Sensing Sensitivity: 2 mV
Pulse Gen Model: 2240
Pulse Gen Serial Number: 7588973

## 2023-03-09 ENCOUNTER — Telehealth: Payer: Self-pay | Admitting: Pulmonary Disease

## 2023-03-09 DIAGNOSIS — J4530 Mild persistent asthma, uncomplicated: Secondary | ICD-10-CM

## 2023-03-09 NOTE — Telephone Encounter (Signed)
 Patient needs a refill of Advair. [employer] will no longer provide her with dulera. She has 9 more refills of dulera but would like it to be switched to advair .  Pharmacy: [facility] on [address]

## 2023-03-10 NOTE — Telephone Encounter (Signed)
 I called and spoke with pt. Pt states she would like to go back to Advair because [employer] no longer covers Dulera. Pt was last seen on 12-07-2022 by Dr Francine Graven. Please advise. Pt would like to use [facility] on [address]

## 2023-03-13 ENCOUNTER — Other Ambulatory Visit: Payer: Self-pay | Admitting: Pulmonary Disease

## 2023-03-13 DIAGNOSIS — J4530 Mild persistent asthma, uncomplicated: Secondary | ICD-10-CM

## 2023-03-13 MED ORDER — FLUTICASONE-SALMETEROL 115-21 MCG/ACT IN AERO: 2.0000 | INHALATION_SPRAY | Freq: Two times a day (BID) | RESPIRATORY_TRACT | 12 refills | Status: DC

## 2023-03-13 MED ORDER — FLUTICASONE-SALMETEROL 250-50 MCG/ACT IN AEPB: 1.0000 | INHALATION_SPRAY | Freq: Two times a day (BID) | RESPIRATORY_TRACT | 11 refills | Status: DC

## 2023-03-13 NOTE — Telephone Encounter (Signed)
 Advair diskus 1 puff twice daily sent to her pharmacy.  JD

## 2023-03-14 NOTE — Telephone Encounter (Signed)
 I called and spoke with the pt and notified of response from Dr Francine Graven. She verbalized understanding. Nothing further needed.

## 2023-03-15 ENCOUNTER — Other Ambulatory Visit: Payer: Self-pay | Admitting: Pulmonary Disease

## 2023-03-15 DIAGNOSIS — J4530 Mild persistent asthma, uncomplicated: Secondary | ICD-10-CM

## 2023-03-15 NOTE — Telephone Encounter (Signed)
 Pharmacy comment: Alternative Requested:THE PRESCRIBED MEDICATION IS NOT COVERED BY INSURANCE. PLEASE CONSIDER CHANGING TO ONE OF THE SUGGESTED COVERED ALTERNATIVES   Please advise alternative.

## 2023-03-16 NOTE — Progress Notes (Signed)
 Remote pacemaker transmission.

## 2023-03-16 NOTE — Addendum Note (Signed)
 Addended by: Geralyn Flash D on: 03/16/2023 10:16 AM   Modules accepted: Orders

## 2023-03-17 ENCOUNTER — Telehealth: Payer: Self-pay

## 2023-03-17 ENCOUNTER — Telehealth: Payer: Self-pay | Admitting: Pulmonary Disease

## 2023-03-17 ENCOUNTER — Other Ambulatory Visit (HOSPITAL_COMMUNITY): Payer: Self-pay

## 2023-03-17 NOTE — Telephone Encounter (Signed)
*  sent to office in original message  Per test claims Advair HFA and Breo Ellipta are showing as $4.80/30 days

## 2023-03-17 NOTE — Telephone Encounter (Signed)
 Please check which ICS/LABA inhalers are best covered by her insurance.  Thanks, JD

## 2023-03-17 NOTE — Telephone Encounter (Signed)
 Per test claims Advair HFA and Breo Ellipta are showing as $4.80/30 days

## 2023-03-20 ENCOUNTER — Other Ambulatory Visit: Payer: Self-pay | Admitting: Pulmonary Disease

## 2023-03-20 DIAGNOSIS — J4530 Mild persistent asthma, uncomplicated: Secondary | ICD-10-CM

## 2023-03-20 NOTE — Telephone Encounter (Signed)
 Pharmacy comment: Alternative Requested:THE PRESCRIBED MEDICATION IS NOT COVERED BY INSURANCE. PLEASE CONSIDER CHANGING TO ONE OF THE SUGGESTED COVERED ALTERNATIVES.

## 2023-03-20 NOTE — Telephone Encounter (Signed)
 thanks

## 2023-03-22 NOTE — Telephone Encounter (Signed)
 Called Pharmacy spoke w/ staff about alternatives other then what they had originally suggested & they were able to get the Advair 1.25 to go through.

## 2023-03-30 ENCOUNTER — Ambulatory Visit: Payer: Medicare Other | Admitting: Neurology

## 2023-04-06 NOTE — Progress Notes (Unsigned)
  Electrophysiology Office Note:   ID:  Jessica, Carpenter 05-19-1950, MRN [id number]  Primary Cardiologist: None Electrophysiologist: Lewayne Bunting, MD  {Click to update primary MD,subspecialty MD or APP then REFRESH:1}    History of Present Illness:   Jessica Carpenter is a 73 y.o. female with h/o CHB, HTN, GERD, and DM2 seen today for routine electrophysiology followup.   Since last being seen in our clinic the patient reports doing ***.  she denies chest pain, palpitations, dyspnea, PND, orthopnea, nausea, vomiting, dizziness, syncope, edema, weight gain, or early satiety.   Review of systems complete and found to be negative unless listed in HPI.   EP Information / Studies Reviewed:    EKG is not ordered today. EKG from 01/22/2023 reviewed which showed NSR at 70 bpm with at least V pacing       PPM Interrogation-  reviewed in detail today,  See PACEART report.  Arrhythmia/Device History PPM-St.Jude-Merlin   Physical Exam:   VS:  There were no vitals taken for this visit.   Wt Readings from Last 3 Encounters:  01/22/23 164 lb (74.4 kg)  12/07/22 163 lb 6.4 oz (74.1 kg)  10/19/22 160 lb (72.6 kg)     GEN: No acute distress  NECK: No JVD; No carotid bruits CARDIAC: {EPRHYTHM:28826}, no murmurs, rubs, gallops RESPIRATORY:  Clear to auscultation without rales, wheezing or rhonchi  ABDOMEN: Soft, non-tender, non-distended EXTREMITIES:  {EDEMA LEVEL:28147::"No"} edema; No deformity   ASSESSMENT AND PLAN:    CHB s/p Abbott PPM  Normal PPM function See Pace Art report No changes today  HTN Stable on current regimen   Obesity There is no height or weight on file to calculate BMI.  Encouraged lifestyle modification   {Click here to Review PMH, Prob List, Meds, Allergies, SHx, FHx  :1}   Disposition:   Follow up with {EPPROVIDERS:28135} {EPFOLLOW UP:28173}  Signed, Graciella Freer, PA-C

## 2023-04-07 ENCOUNTER — Encounter: Payer: Self-pay | Admitting: Student

## 2023-04-07 ENCOUNTER — Ambulatory Visit: Payer: Medicare Other | Attending: Internal Medicine | Admitting: Student

## 2023-04-07 VITALS — BP 134/66 | HR 60 | Resp 16 | Ht 64.0 in | Wt 168.8 lb

## 2023-04-07 DIAGNOSIS — I442 Atrioventricular block, complete: Secondary | ICD-10-CM | POA: Insufficient documentation

## 2023-04-07 DIAGNOSIS — G4733 Obstructive sleep apnea (adult) (pediatric): Secondary | ICD-10-CM | POA: Insufficient documentation

## 2023-04-07 DIAGNOSIS — I10 Essential (primary) hypertension: Secondary | ICD-10-CM | POA: Diagnosis present

## 2023-04-07 LAB — CUP PACEART INCLINIC DEVICE CHECK
Battery Remaining Longevity: 5 mo
Battery Voltage: 2.72 V
Brady Statistic RA Percent Paced: 51 %
Brady Statistic RV Percent Paced: 99.93 %
Date Time Interrogation Session: 20250404132208
Implantable Lead Connection Status: 753985
Implantable Lead Connection Status: 753985
Implantable Lead Implant Date: 20150202
Implantable Lead Implant Date: 20150202
Implantable Lead Location: 753859
Implantable Lead Location: 753860
Implantable Pulse Generator Implant Date: 20150202
Lead Channel Impedance Value: 462.5 Ohm
Lead Channel Impedance Value: 487.5 Ohm
Lead Channel Pacing Threshold Amplitude: 0.5 V
Lead Channel Pacing Threshold Amplitude: 0.5 V
Lead Channel Pacing Threshold Amplitude: 0.75 V
Lead Channel Pacing Threshold Pulse Width: 0.4 ms
Lead Channel Pacing Threshold Pulse Width: 0.4 ms
Lead Channel Pacing Threshold Pulse Width: 0.4 ms
Lead Channel Sensing Intrinsic Amplitude: 3.9 mV
Lead Channel Sensing Intrinsic Amplitude: 4.1 mV
Lead Channel Setting Pacing Amplitude: 1 V
Lead Channel Setting Pacing Amplitude: 2 V
Lead Channel Setting Pacing Pulse Width: 0.4 ms
Lead Channel Setting Sensing Sensitivity: 2 mV
Pulse Gen Model: 2240
Pulse Gen Serial Number: 7588973

## 2023-04-07 NOTE — Patient Instructions (Signed)
 Medication Instructions:  Your physician recommends that you continue on your current medications as directed. Please refer to the Current Medication list given to you today.  *If you need a refill on your cardiac medications before your next appointment, please call your pharmacy*  Lab Work: None ordered If you have labs (blood work) drawn today and your tests are completely normal, you will receive your results only by: MyChart Message (if you have MyChart) OR A paper copy in the mail If you have any lab test that is abnormal or we need to change your treatment, we will call you to review the results.  Follow-Up: At [facility], you and your health needs are our priority.  As part of our continuing mission to provide you with exceptional heart care, our providers are all part of one team.  This team includes your primary Cardiologist (physician) and Advanced Practice Providers or APPs (Physician Assistants and Nurse Practitioners) who all work together to provide you with the care you need, when you need it.  Your next appointment:   6 month(s)  Provider:   Baldwin Crown" Tillery, PA-C to discuss gen change   We recommend signing up for the patient portal called "MyChart".  Sign up information is provided on this After Visit Summary.  MyChart is used to connect with patients for Virtual Visits (Telemedicine).  Patients are able to view lab/test results, encounter notes, upcoming appointments, etc.  Non-urgent messages can be sent to your provider as well.   To learn more about what you can do with MyChart, go to [email or web address].     1st Floor: - Lobby - Registration  - Pharmacy  - Lab - Cafe  2nd Floor: - PV Lab - Diagnostic Testing (echo, CT, nuclear med)  3rd Floor: - Vacant  4th Floor: - TCTS (cardiothoracic surgery) - AFib Clinic - Structural Heart Clinic - Vascular Surgery  - Vascular Ultrasound  5th Floor: - HeartCare Cardiology (general and  EP) - Clinical Pharmacy for coumadin, hypertension, lipid, weight-loss medications, and med management appointments    Valet parking services will be available as well.

## 2023-05-08 ENCOUNTER — Ambulatory Visit (INDEPENDENT_AMBULATORY_CARE_PROVIDER_SITE_OTHER): Payer: Medicare Other

## 2023-05-08 DIAGNOSIS — I442 Atrioventricular block, complete: Secondary | ICD-10-CM

## 2023-05-09 LAB — CUP PACEART REMOTE DEVICE CHECK
Battery Remaining Longevity: 4 mo
Battery Remaining Percentage: 3 %
Battery Voltage: 2.69 V
Brady Statistic AP VP Percent: 57 %
Brady Statistic AP VS Percent: 1 %
Brady Statistic AS VP Percent: 43 %
Brady Statistic AS VS Percent: 1 %
Brady Statistic RA Percent Paced: 57 %
Brady Statistic RV Percent Paced: 99 %
Date Time Interrogation Session: 20250505025746
Implantable Lead Connection Status: 753985
Implantable Lead Connection Status: 753985
Implantable Lead Implant Date: 20150202
Implantable Lead Implant Date: 20150202
Implantable Lead Location: 753859
Implantable Lead Location: 753860
Implantable Pulse Generator Implant Date: 20150202
Lead Channel Impedance Value: 450 Ohm
Lead Channel Impedance Value: 490 Ohm
Lead Channel Pacing Threshold Amplitude: 0.5 V
Lead Channel Pacing Threshold Amplitude: 1 V
Lead Channel Pacing Threshold Pulse Width: 0.4 ms
Lead Channel Pacing Threshold Pulse Width: 0.4 ms
Lead Channel Sensing Intrinsic Amplitude: 3.4 mV
Lead Channel Sensing Intrinsic Amplitude: 4.7 mV
Lead Channel Setting Pacing Amplitude: 1.25 V
Lead Channel Setting Pacing Amplitude: 2 V
Lead Channel Setting Pacing Pulse Width: 0.4 ms
Lead Channel Setting Sensing Sensitivity: 2 mV
Pulse Gen Model: 2240
Pulse Gen Serial Number: 7588973

## 2023-06-01 ENCOUNTER — Other Ambulatory Visit: Payer: Self-pay | Admitting: Internal Medicine

## 2023-06-15 ENCOUNTER — Encounter: Payer: Self-pay | Admitting: Neurology

## 2023-06-15 ENCOUNTER — Ambulatory Visit (INDEPENDENT_AMBULATORY_CARE_PROVIDER_SITE_OTHER): Admitting: Neurology

## 2023-06-15 VITALS — BP 126/74 | Ht 63.0 in | Wt 168.0 lb

## 2023-06-15 DIAGNOSIS — R569 Unspecified convulsions: Secondary | ICD-10-CM | POA: Diagnosis not present
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
## 2023-06-15 NOTE — Patient Instructions (Signed)
 Hold Levetiracetam  for now  Routine EEG, if normal we will proceed with 3 day ambulatory EEG  If any abnormality that lower seizure threshold, we will restart Levetiracetam  and obtain MRI Brain  Please call if you do have another event

## 2023-06-26 ENCOUNTER — Other Ambulatory Visit: Payer: Self-pay | Admitting: Neurology

## 2023-06-26 ENCOUNTER — Ambulatory Visit (INDEPENDENT_AMBULATORY_CARE_PROVIDER_SITE_OTHER): Admitting: Neurology

## 2023-06-26 ENCOUNTER — Ambulatory Visit: Payer: Self-pay | Admitting: Neurology

## 2023-06-26 DIAGNOSIS — R569 Unspecified convulsions: Secondary | ICD-10-CM | POA: Diagnosis not present

## 2023-06-26 NOTE — Progress Notes (Signed)
 Please call and inform patient that her EEG (Brain wave test) was normal. In particular, there were no epileptiform discharges and no seizures. We will order a 3 day ambulatory EEG. Please keep any upcoming appointments or tests and  call us  with any interim questions, concerns, problems or updates. Thanks,   Pastor Falling, MD

## 2023-06-26 NOTE — Procedures (Signed)
   History:  73 year old woman with seizure like activity.   EEG classification:  Awake and asleep  Duration: 27 minutes  Technical aspects: This EEG study was done with scalp electrodes positioned according to the 10-20 International system of electrode placement. Electrical activity was reviewed with band pass filter of 1-70Hz , sensitivity of 7 uV/mm, display speed of 71mm/sec with a 60Hz  notched filter applied as appropriate. EEG data were recorded continuously and digitally stored.   Description of the recording: The background rhythms of this recording consists of a fairly well modulated medium amplitude background activity of 10 Hz. As the record progresses, the patient initially is in the waking state, but appears to enter the early stage II sleep during the recording, with rudimentary sleep spindles and vertex sharp wave activity seen. During the wakeful state, photic stimulation was performed, and no abnormal responses were seen. Hyperventilation was also performed, no abnormal response seen. No epileptiform discharges seen during this recording. There was no focal slowing.   Abnormality: None   Impression: This is a normal awake and sleep EEG. No evidence of interictal epileptiform discharges. Normal EEGs, however, do not rule out epilepsy.    Rana Adorno, MD [facility]

## 2023-06-27 ENCOUNTER — Telehealth: Payer: Self-pay

## 2023-06-27 NOTE — Addendum Note (Signed)
 Addended by: TAWNI DRILLING D on: 06/27/2023 12:04 PM   Modules accepted: Orders

## 2023-06-27 NOTE — Progress Notes (Signed)
 Remote pacemaker transmission.

## 2023-06-27 NOTE — Telephone Encounter (Signed)
 SABRA

## 2023-07-06 ENCOUNTER — Ambulatory Visit (INDEPENDENT_AMBULATORY_CARE_PROVIDER_SITE_OTHER)

## 2023-07-06 DIAGNOSIS — I442 Atrioventricular block, complete: Secondary | ICD-10-CM | POA: Diagnosis not present

## 2023-07-06 LAB — CUP PACEART REMOTE DEVICE CHECK
Battery Remaining Longevity: 1 mo
Battery Remaining Percentage: 1 %
Battery Voltage: 2.63 V
Brady Statistic AP VP Percent: 62 %
Brady Statistic AP VS Percent: 1 %
Brady Statistic AS VP Percent: 38 %
Brady Statistic AS VS Percent: 1 %
Brady Statistic RA Percent Paced: 62 %
Brady Statistic RV Percent Paced: 99 %
Date Time Interrogation Session: 20250703052516
Implantable Lead Connection Status: 753985
Implantable Lead Connection Status: 753985
Implantable Lead Implant Date: 20150202
Implantable Lead Implant Date: 20150202
Implantable Lead Location: 753859
Implantable Lead Location: 753860
Implantable Pulse Generator Implant Date: 20150202
Lead Channel Impedance Value: 400 Ohm
Lead Channel Impedance Value: 450 Ohm
Lead Channel Pacing Threshold Amplitude: 0.5 V
Lead Channel Pacing Threshold Amplitude: 0.75 V
Lead Channel Pacing Threshold Pulse Width: 0.4 ms
Lead Channel Pacing Threshold Pulse Width: 0.4 ms
Lead Channel Sensing Intrinsic Amplitude: 3.4 mV
Lead Channel Sensing Intrinsic Amplitude: 4.7 mV
Lead Channel Setting Pacing Amplitude: 1 V
Lead Channel Setting Pacing Amplitude: 2 V
Lead Channel Setting Pacing Pulse Width: 0.4 ms
Lead Channel Setting Sensing Sensitivity: 2 mV
Pulse Gen Model: 2240
Pulse Gen Serial Number: 7588973

## 2023-07-13 ENCOUNTER — Ambulatory Visit: Payer: Self-pay | Admitting: Internal Medicine

## 2023-07-17 DIAGNOSIS — R569 Unspecified convulsions: Secondary | ICD-10-CM

## 2023-07-25 ENCOUNTER — Encounter (INDEPENDENT_AMBULATORY_CARE_PROVIDER_SITE_OTHER): Payer: Self-pay | Admitting: Neurology

## 2023-07-25 ENCOUNTER — Ambulatory Visit: Payer: Self-pay | Admitting: Neurology

## 2023-07-25 DIAGNOSIS — R569 Unspecified convulsions: Secondary | ICD-10-CM

## 2023-07-25 NOTE — Procedures (Signed)
 Clinical History:  This is 73 year old woman past medical history of heart disease, hypertension, hyperlipidemia, diabetes mellitus, asthma, osteoarthritis who is presenting with seizure-like activity. In total patient has 2 events and the first 1 was in August 2024 and last one in January. Both events occurred during sleep.   INTERMITTENT MONITORING with VIDEO TECHNICAL SUMMARY:  This AVEEG was performed using equipment provided by [employer] utilizing Bluetooth ( [employer] ) amplifiers with continuous EEGT attended video collection using encrypted remote transmission via [employer] secured cellular tower network with data rates for each AVEEG performed. This is a Therapist, music AVEEG, obtained, according to the 10-20 international electrode placement system, reformatted digitally into referential and bipolar montages. Data was acquired with a minimum of 21 bipolar connections and sampled at a minimum rate of 250 cycles per second per channel, maximum rate of 450 cycles per second per channel and two channels for EKG. The entire VEEG study was recorded through cable and or radio telemetry for subsequent analysis. Specified epochs of the AVEEG data were identified at the direction of the subject by the depression of a push button by the patient. Each patients event file included data acquired two minutes prior to the push button activation and continuing until two minutes afterwards. AVEEG files were reviewed on [employer] server, Licensed Software provided by [employer] with a digital high frequency filter set at 70 Hz and a low frequency filter set at 1 Hz with a paper speed of 50mm/s resulting in 10 seconds per digital page. This entire AVEEG was reviewed by the EEG Technologist. Random time samples, random sleep samples, clips, patient initiated push button files with included patient daily diary logs, EEG Technologist pruned data was reviewed and verified for accuracy and validity  by the governing reading neurologist in full details. This AEEGV was fully compliant with all requirements for CPT 97500 for setup, patient education, take down and administered by an EEG technologist.   Long-Term EEG with Video was monitored intermittently by a qualified EEG technologist for the entirety of the recording; quality check-ins were performed at a minimum of every two hours, checking and documenting real-time data and video to assure the integrity and quality of the recording (e.g., camera position, electrode integrity and impedance), and identify the need for maintenance. For intermittent monitoring, an EEG Technologist monitored no more than 12 patients concurrently. Diagnostic video was captured at least 80% of the time during the recording.   PATIENT EVENTS:  There were no patient events noted or captured during this recording.   TECHNOLOGIST EVENTS:  No clear epileptiform activity was detected by the reviewing neurodiagnostic technologist for further review. Video was not recorded due to technical issues.   TIME SAMPLES:  10-minutes of every 2 hours recorded are reviewed as random time samples.   SLEEP SAMPLES:  5-minutes of every 24 hours recorded are reviewed as random sleep samples.   AWAKE:  At maximal level of alertness, the posterior dominant background activity was continuous, reactive, low voltage rhythm of 10 Hz. This was symmetric, well-modulated, and attenuated with eye opening. Diffuse, symmetric, frontocentral beta range activity was present.  SLEEP:   N1 Sleep (Stage 1) was observed and characterized by the disappearance of alpha rhythm and the appearance of vertex activity.   N2 Sleep (Stage 2) was observed and characterized by vertex waves, K-complexes, and sleep spindles.   N3 (Stage 3) sleep was observed and characterized by high amplitude Delta activity of 20%.   REM sleep  was observed.   EKG:  There were no arrhythmias or abnormalities noted during  this recording.   Impression:  Normal EEG: awake and asleep   Clinical Correlation:  This is a normal 3-day ambulatory EEG tracing. No focal abnormalities or epileptiform discharges were seen. There were no electrographic seizures noted. No events were captured during the recording. Please note a normal EEG does not exclude the diagnosis of epilepsy.    Braniyah Besse, MD [facility]

## 2023-07-25 NOTE — Progress Notes (Signed)
 Please call and inform patient that her 3 day EEG (Brain wave test) was normal. Please advise patient to call me if she has another event, at that time, we will have to restart her on antiseizure medication (another medication other than Keppra ).. Please keep any upcoming appointments or tests and  call us  with any interim questions, concerns, problems or updates. Thanks,   Pastor Falling, MD

## 2023-08-02 ENCOUNTER — Telehealth: Payer: Self-pay

## 2023-08-02 NOTE — Telephone Encounter (Signed)
 Alert received from [employer] for pacemaker at ERI, reached 08/01/23.  Attempted to contact patient. No answer, left message to call back.

## 2023-08-03 NOTE — Telephone Encounter (Signed)
 Spoke with Pt.  Advised device was ERI and time to schedule appointment to discuss gen change.  Pt scheduled to see Dr. Waddell August 10, 2023.  Directions given to new building.

## 2023-08-07 ENCOUNTER — Ambulatory Visit: Payer: Medicare Other

## 2023-08-07 ENCOUNTER — Ambulatory Visit

## 2023-08-07 DIAGNOSIS — I442 Atrioventricular block, complete: Secondary | ICD-10-CM

## 2023-08-07 LAB — CUP PACEART REMOTE DEVICE CHECK
Battery Remaining Longevity: 0 mo
Battery Voltage: 2.6 V
Brady Statistic AP VP Percent: 62 %
Brady Statistic AP VS Percent: 1 %
Brady Statistic AS VP Percent: 38 %
Brady Statistic AS VS Percent: 1 %
Brady Statistic RA Percent Paced: 62 %
Brady Statistic RV Percent Paced: 99 %
Date Time Interrogation Session: 20250803054254
Implantable Lead Connection Status: 753985
Implantable Lead Connection Status: 753985
Implantable Lead Implant Date: 20150202
Implantable Lead Implant Date: 20150202
Implantable Lead Location: 753859
Implantable Lead Location: 753860
Implantable Pulse Generator Implant Date: 20150202
Lead Channel Impedance Value: 440 Ohm
Lead Channel Impedance Value: 540 Ohm
Lead Channel Pacing Threshold Amplitude: 0.5 V
Lead Channel Pacing Threshold Amplitude: 0.75 V
Lead Channel Pacing Threshold Pulse Width: 0.4 ms
Lead Channel Pacing Threshold Pulse Width: 0.4 ms
Lead Channel Sensing Intrinsic Amplitude: 4.3 mV
Lead Channel Sensing Intrinsic Amplitude: 4.5 mV
Lead Channel Setting Pacing Amplitude: 1 V
Lead Channel Setting Pacing Amplitude: 2 V
Lead Channel Setting Pacing Pulse Width: 0.4 ms
Lead Channel Setting Sensing Sensitivity: 2 mV
Pulse Gen Model: 2240
Pulse Gen Serial Number: 7588973

## 2023-08-08 ENCOUNTER — Ambulatory Visit: Payer: Self-pay | Admitting: Internal Medicine

## 2023-08-10 ENCOUNTER — Encounter: Payer: Self-pay | Admitting: Internal Medicine

## 2023-08-10 ENCOUNTER — Ambulatory Visit: Attending: Internal Medicine | Admitting: Internal Medicine

## 2023-08-10 ENCOUNTER — Encounter: Payer: Self-pay | Admitting: *Deleted

## 2023-08-10 VITALS — BP 140/72 | HR 74 | Ht 63.5 in | Wt 171.6 lb

## 2023-08-10 DIAGNOSIS — Z01812 Encounter for preprocedural laboratory examination: Secondary | ICD-10-CM | POA: Insufficient documentation

## 2023-08-10 DIAGNOSIS — I442 Atrioventricular block, complete: Secondary | ICD-10-CM | POA: Diagnosis not present

## 2023-08-10 LAB — CBC

## 2023-08-10 NOTE — H&P (View-Only) (Signed)
 HPI [name] returns today for followup of HTN and CHB, s/p PPM insertion. She has HTN.  She has class 2 CHF symptoms. She has lost another 16 lbs.   She has not had syncope and has minimal palpitations. She has reached ERI on her PPM. She denies chest pain or sob.  Allergies  Allergen Reactions   Moxifloxacin Hives, Shortness Of Breath and Other (See Comments)    Caused syncope and throat tightness   Penicillins Hives, Shortness Of Breath and Other (See Comments)    Caused syncope and throat tightness Has patient had a PCN reaction causing immediate rash, facial/tongue/throat swelling, SOB or lightheadedness with hypotension: Yes Has patient had a PCN reaction causing severe rash involving mucus membranes or skin necrosis: No Has patient had a PCN reaction that required hospitalization: hospital visit but not overnight Has patient had a PCN reaction occurring within the last 10 years: No If all of the above answers are NO, then may proceed with Cephalosporin   Arimidex [Anastrozole]      Current Outpatient Medications  Medication Sig Dispense Refill   albuterol  (VENTOLIN  HFA) 108 (90 Base) MCG/ACT inhaler Inhale 1-2 puffs into the lungs every 4 (four) hours as needed for wheezing or shortness of breath. 1 each 6   amLODipine  (NORVASC ) 10 MG tablet Take 10 mg by mouth daily.     aspirin  EC 81 MG tablet Take 81 mg by mouth daily.     fluticasone  (FLONASE ) 50 MCG/ACT nasal spray Place 1 spray into both nostrils daily. 16 g 11   fluticasone -salmeterol (ADVAIR) 250-50 MCG/ACT AEPB Inhale 1 puff into the lungs every 12 (twelve) hours. 60 each 11   hydrochlorothiazide  (HYDRODIURIL ) 25 MG tablet Take 25 mg by mouth daily.      latanoprost (XALATAN) 0.005 % ophthalmic solution Place 1 drop into both eyes at bedtime.     levETIRAcetam  (KEPPRA ) 250 MG tablet Take 1 tablet (250 mg total) by mouth 2 (two) times daily. (Patient taking differently: Take 125 mg by mouth 2 (two) times daily.)  60 tablet 2   losartan  (COZAAR ) 100 MG tablet Take 100 mg by mouth daily.     lovastatin (MEVACOR) 10 MG tablet Take 10 mg by mouth daily with supper.     metFORMIN  (GLUCOPHAGE ) 1000 MG tablet Take 1,000 mg by mouth daily with breakfast.     nystatin  (MYCOSTATIN ) 100000 UNIT/ML suspension SMARTSIG:4 Milliliter(s) By Mouth 4 Times Daily 60 mL 6   omeprazole  (PRILOSEC) 20 MG capsule TAKE 1 CAPSULE (20 MG TOTAL) BY MOUTH 2 (TWO) TIMES DAILY BEFORE A MEAL. 180 capsule 3   Polyethyl Glycol-Propyl Glycol (SYSTANE OP) Place 1 drop into both eyes daily as needed (dry eyes).     Spacer/Aero-Holding Chambers (AEROCHAMBER MV) inhaler Use as instructed 1 each 0   No current facility-administered medications for this visit.     Past Medical History:  Diagnosis Date   Allergic rhinitis    Arthritis    left leg (02/04/2013)   Asthma    CHF (congestive heart failure) (HCC)    Chronic bronchitis (HCC)    usually get it q yr (02/04/2013)   GERD (gastroesophageal reflux disease)    Glaucoma    Gout    used to; haven't had it lately (02/04/2013)   Hypertension    Pacemaker    PONV (postoperative nausea and vomiting)    Sleep apnea    Symptomatic bradycardia    s/p STJ dual chamber pacemaker by  Dr Waddell 02/2013   Type II diabetes mellitus (HCC)     ROS:   All systems reviewed and negative except as noted in the HPI.   Past Surgical History:  Procedure Laterality Date   ABDOMINAL HYSTERECTOMY  ?1980's   BREAST BIOPSY Right    benign   BREAST LUMPECTOMY Right    benign   DILATION AND CURETTAGE OF UTERUS     DIRECT LARYNGOSCOPY N/A 10/19/2022   Procedure: DIRECT LARYNGOSCOPY WITH BIOPSY;  Surgeon: Carlie Clark, MD;  Location: [facility] OR;  Service: ENT;  Laterality: N/A;   GANGLION CYST EXCISION Right 2003   Ganglion of wrist, volar and distal radius   PACEMAKER INSERTION  02/04/2013   STJ [employer]urity dual chamber pacemaker implanted by Dr Waddell for symptomatic bradycardia   PERMANENT  PACEMAKER INSERTION N/A 02/04/2013   Procedure: PERMANENT PACEMAKER INSERTION;  Surgeon: Danelle LELON Waddell, MD;  Location: [facility] CATH LAB;  Service: Cardiovascular;  Laterality: N/A;   TUBAL LIGATION       Family History  Problem Relation Age of Onset   Breast cancer Mother    Emphysema Father    Colon polyps Neg Hx    Colon cancer Neg Hx    Esophageal cancer Neg Hx    Rectal cancer Neg Hx    Stomach cancer Neg Hx      Social History   Socioeconomic History   Marital status: Divorced    Spouse name: Not on file   Number of children: Not on file   Years of education: Not on file   Highest education level: Not on file  Occupational History   Occupation: disabled   Tobacco Use   Smoking status: Never   Smokeless tobacco: Never  Vaping Use   Vaping status: Never Used  Substance and Sexual Activity   Alcohol use: Not Currently    Comment: occas   Drug use: No   Sexual activity: Not on file  Other Topics Concern   Not on file  Social History Narrative   Right handed   Retired/disabilty   Caffeine 2 cups   Social Drivers of Health   Financial Resource Strain: Not at Risk (11/23/2022)   Received from [employer] Strain    How hard is it for you to pay for the very basics like food, housing, heating, medical care, and medications?: 1  Food Insecurity: Not at Risk (11/23/2022)   Received from [employer] Insecurity    Within the past 12 months, you worried that your food would run out before you got money to buy more.: 1  Transportation Needs: Not at Risk (11/23/2022)   Received from [facility] Needs    In the past 12 months, has lack of transportation kept you from medical appointments, meetings, work or from getting things needed for daily living?: 1  Physical Activity: Not at Risk (11/23/2022)   Received from [facility]   Physical Activity    Weekly Physical Activity: 1  Stress: Not at Risk (11/23/2022)   Received from [facility]   Stress    Do you  feel these kinds of stress these days?: 1  Social Connections: Not at Risk (11/23/2022)   Received from [facility]   Social Connections    How often do you see or talk to people that you care about and feel close to? (For example: talking to friends on phone, visiting friends or family, going to church or club meetings): 1  Intimate Partner  Violence: Not At Risk (08/13/2022)   Humiliation, Afraid, Rape, and Kick questionnaire    Fear of Current or Ex-Partner: No    Emotionally Abused: No    Physically Abused: No    Sexually Abused: No     BP (!) 140/72   Pulse 74   Ht 5' 3.5 (1.613 m)   Wt 171 lb 9.6 oz (77.8 kg)   SpO2 98%   BMI 29.92 kg/m   Physical Exam:  Well appearing 73 yo woman, NAD HEENT: Unremarkable Neck:  No JVD, no thyromegally Lymphatics:  No adenopathy Back:  No CVA tenderness Lungs:  Clear with no wheezes HEART:  Regular rate rhythm, no murmurs, no rubs, no clicks Abd:  soft, positive bowel sounds, no organomegally, no rebound, no guarding Ext:  2 plus pulses, no edema, no cyanosis, no clubbing Skin:  No rashes no nodules Neuro:  CN II through XII intact, motor grossly intact  DEVICE  Normal device function.  See PaceArt for details. ERI.  Assess/Plan: CHB - she is asymptomatic s/p PPM insertion. PPM - her [employer] DDD PM has reached ERI. She will undergo gen change out. I have reviewed the indications/risks/benefits/goals/expectations and she wishes to proceed.  Danelle Vollie Brunty,MD

## 2023-08-10 NOTE — Progress Notes (Signed)
 HPI [name] returns today for followup of HTN and CHB, s/p PPM insertion. She has HTN.  She has class 2 CHF symptoms. She has lost another 16 lbs.   She has not had syncope and has minimal palpitations. She has reached ERI on her PPM. She denies chest pain or sob.  Allergies  Allergen Reactions   Moxifloxacin Hives, Shortness Of Breath and Other (See Comments)    Caused syncope and throat tightness   Penicillins Hives, Shortness Of Breath and Other (See Comments)    Caused syncope and throat tightness Has patient had a PCN reaction causing immediate rash, facial/tongue/throat swelling, SOB or lightheadedness with hypotension: Yes Has patient had a PCN reaction causing severe rash involving mucus membranes or skin necrosis: No Has patient had a PCN reaction that required hospitalization: hospital visit but not overnight Has patient had a PCN reaction occurring within the last 10 years: No If all of the above answers are NO, then may proceed with Cephalosporin   Arimidex [Anastrozole]      Current Outpatient Medications  Medication Sig Dispense Refill   albuterol  (VENTOLIN  HFA) 108 (90 Base) MCG/ACT inhaler Inhale 1-2 puffs into the lungs every 4 (four) hours as needed for wheezing or shortness of breath. 1 each 6   amLODipine  (NORVASC ) 10 MG tablet Take 10 mg by mouth daily.     aspirin  EC 81 MG tablet Take 81 mg by mouth daily.     fluticasone  (FLONASE ) 50 MCG/ACT nasal spray Place 1 spray into both nostrils daily. 16 g 11   fluticasone -salmeterol (ADVAIR) 250-50 MCG/ACT AEPB Inhale 1 puff into the lungs every 12 (twelve) hours. 60 each 11   hydrochlorothiazide  (HYDRODIURIL ) 25 MG tablet Take 25 mg by mouth daily.      latanoprost (XALATAN) 0.005 % ophthalmic solution Place 1 drop into both eyes at bedtime.     levETIRAcetam  (KEPPRA ) 250 MG tablet Take 1 tablet (250 mg total) by mouth 2 (two) times daily. (Patient taking differently: Take 125 mg by mouth 2 (two) times daily.)  60 tablet 2   losartan  (COZAAR ) 100 MG tablet Take 100 mg by mouth daily.     lovastatin (MEVACOR) 10 MG tablet Take 10 mg by mouth daily with supper.     metFORMIN  (GLUCOPHAGE ) 1000 MG tablet Take 1,000 mg by mouth daily with breakfast.     nystatin  (MYCOSTATIN ) 100000 UNIT/ML suspension SMARTSIG:4 Milliliter(s) By Mouth 4 Times Daily 60 mL 6   omeprazole  (PRILOSEC) 20 MG capsule TAKE 1 CAPSULE (20 MG TOTAL) BY MOUTH 2 (TWO) TIMES DAILY BEFORE A MEAL. 180 capsule 3   Polyethyl Glycol-Propyl Glycol (SYSTANE OP) Place 1 drop into both eyes daily as needed (dry eyes).     Spacer/Aero-Holding Chambers (AEROCHAMBER MV) inhaler Use as instructed 1 each 0   No current facility-administered medications for this visit.     Past Medical History:  Diagnosis Date   Allergic rhinitis    Arthritis    left leg (02/04/2013)   Asthma    CHF (congestive heart failure) (HCC)    Chronic bronchitis (HCC)    usually get it q yr (02/04/2013)   GERD (gastroesophageal reflux disease)    Glaucoma    Gout    used to; haven't had it lately (02/04/2013)   Hypertension    Pacemaker    PONV (postoperative nausea and vomiting)    Sleep apnea    Symptomatic bradycardia    s/p STJ dual chamber pacemaker by  Dr Waddell 02/2013   Type II diabetes mellitus (HCC)     ROS:   All systems reviewed and negative except as noted in the HPI.   Past Surgical History:  Procedure Laterality Date   ABDOMINAL HYSTERECTOMY  ?1980's   BREAST BIOPSY Right    benign   BREAST LUMPECTOMY Right    benign   DILATION AND CURETTAGE OF UTERUS     DIRECT LARYNGOSCOPY N/A 10/19/2022   Procedure: DIRECT LARYNGOSCOPY WITH BIOPSY;  Surgeon: Carlie Clark, MD;  Location: [facility] OR;  Service: ENT;  Laterality: N/A;   GANGLION CYST EXCISION Right 2003   Ganglion of wrist, volar and distal radius   PACEMAKER INSERTION  02/04/2013   STJ [employer]urity dual chamber pacemaker implanted by Dr Waddell for symptomatic bradycardia   PERMANENT  PACEMAKER INSERTION N/A 02/04/2013   Procedure: PERMANENT PACEMAKER INSERTION;  Surgeon: Danelle LELON Waddell, MD;  Location: [facility] CATH LAB;  Service: Cardiovascular;  Laterality: N/A;   TUBAL LIGATION       Family History  Problem Relation Age of Onset   Breast cancer Mother    Emphysema Father    Colon polyps Neg Hx    Colon cancer Neg Hx    Esophageal cancer Neg Hx    Rectal cancer Neg Hx    Stomach cancer Neg Hx      Social History   Socioeconomic History   Marital status: Divorced    Spouse name: Not on file   Number of children: Not on file   Years of education: Not on file   Highest education level: Not on file  Occupational History   Occupation: disabled   Tobacco Use   Smoking status: Never   Smokeless tobacco: Never  Vaping Use   Vaping status: Never Used  Substance and Sexual Activity   Alcohol use: Not Currently    Comment: occas   Drug use: No   Sexual activity: Not on file  Other Topics Concern   Not on file  Social History Narrative   Right handed   Retired/disabilty   Caffeine 2 cups   Social Drivers of Health   Financial Resource Strain: Not at Risk (11/23/2022)   Received from [employer] Strain    How hard is it for you to pay for the very basics like food, housing, heating, medical care, and medications?: 1  Food Insecurity: Not at Risk (11/23/2022)   Received from [employer] Insecurity    Within the past 12 months, you worried that your food would run out before you got money to buy more.: 1  Transportation Needs: Not at Risk (11/23/2022)   Received from [facility] Needs    In the past 12 months, has lack of transportation kept you from medical appointments, meetings, work or from getting things needed for daily living?: 1  Physical Activity: Not at Risk (11/23/2022)   Received from [facility]   Physical Activity    Weekly Physical Activity: 1  Stress: Not at Risk (11/23/2022)   Received from [facility]   Stress    Do you  feel these kinds of stress these days?: 1  Social Connections: Not at Risk (11/23/2022)   Received from [facility]   Social Connections    How often do you see or talk to people that you care about and feel close to? (For example: talking to friends on phone, visiting friends or family, going to church or club meetings): 1  Intimate Partner  Violence: Not At Risk (08/13/2022)   Humiliation, Afraid, Rape, and Kick questionnaire    Fear of Current or Ex-Partner: No    Emotionally Abused: No    Physically Abused: No    Sexually Abused: No     BP (!) 140/72   Pulse 74   Ht 5' 3.5 (1.613 m)   Wt 171 lb 9.6 oz (77.8 kg)   SpO2 98%   BMI 29.92 kg/m   Physical Exam:  Well appearing 73 yo woman, NAD HEENT: Unremarkable Neck:  No JVD, no thyromegally Lymphatics:  No adenopathy Back:  No CVA tenderness Lungs:  Clear with no wheezes HEART:  Regular rate rhythm, no murmurs, no rubs, no clicks Abd:  soft, positive bowel sounds, no organomegally, no rebound, no guarding Ext:  2 plus pulses, no edema, no cyanosis, no clubbing Skin:  No rashes no nodules Neuro:  CN II through XII intact, motor grossly intact  DEVICE  Normal device function.  See PaceArt for details. ERI.  Assess/Plan: CHB - she is asymptomatic s/p PPM insertion. PPM - her St. Jude DDD PM has reached ERI. She will undergo gen change out. I have reviewed the indications/risks/benefits/goals/expectations and she wishes to proceed.  Danelle Vollie Brunty,MD

## 2023-08-10 NOTE — Patient Instructions (Addendum)
 Medication Instructions:  Your physician recommends that you continue on your current medications as directed. Please refer to the Current Medication list given to you today.  *If you need a refill on your cardiac medications before your next appointment, please call your pharmacy*  Lab Work: Pre procedure labs today: BMET & CBC  If you have any lab test that is abnormal or we need to change your treatment, we will call you to review the results.  Testing/Procedures: Your physician has recommended that you have a pacemaker battery change.  Please see the instruction sheet given to you today for more information.   Follow-Up: At [facility], you and your health needs are our priority.  As part of our continuing mission to provide you with exceptional heart care, our providers are all part of one team.  This team includes your primary Cardiologist (physician) and Advanced Practice Providers or APPs (Physician Assistants and Nurse Practitioners) who all work together to provide you with the care you need, when you need it.  Your next appointment:   2 week(s) after your battery change  Provider:   Device clinic for a wound check    We recommend signing up for the patient portal called MyChart.  Sign up information is provided on this After Visit Summary.  MyChart is used to connect with patients for Virtual Visits (Telemedicine).  Patients are able to view lab/test results, encounter notes, upcoming appointments, etc.  Non-urgent messages can be sent to your provider as well.   To learn more about what you can do with MyChart, go to [email or web address].   Thank you for choosing [facility]!!   [phone]

## 2023-08-11 ENCOUNTER — Encounter (HOSPITAL_COMMUNITY): Payer: Self-pay | Admitting: Emergency Medicine

## 2023-08-11 ENCOUNTER — Ambulatory Visit (HOSPITAL_COMMUNITY)
Admission: EM | Admit: 2023-08-11 | Discharge: 2023-08-11 | Disposition: A | Discharge status: Home / Self Care | Attending: Emergency Medicine | Admitting: Emergency Medicine

## 2023-08-11 DIAGNOSIS — B9689 Other specified bacterial agents as the cause of diseases classified elsewhere: Secondary | ICD-10-CM

## 2023-08-11 DIAGNOSIS — R1032 Left lower quadrant pain: Secondary | ICD-10-CM

## 2023-08-11 DIAGNOSIS — J019 Acute sinusitis, unspecified: Secondary | ICD-10-CM

## 2023-08-11 LAB — POCT URINALYSIS DIP (MANUAL ENTRY)
Blood, UA: NEGATIVE
Glucose, UA: NEGATIVE mg/dL
Leukocytes, UA: NEGATIVE
Nitrite, UA: NEGATIVE
Protein Ur, POC: 100 mg/dL — AB
Spec Grav, UA: >=1.03 — AB (ref 1.010–1.025)
Urobilinogen, UA: 0.2 U/dL
pH, UA: 5.5 (ref 5.0–8.0)

## 2023-08-11 LAB — CBC
Hematocrit: 46.3 (ref 34.0–46.6)
Hemoglobin: 14.7 g/dL (ref 11.1–15.9)
MCH: 27.6 pg (ref 26.6–33.0)
MCHC: 31.7 g/dL (ref 31.5–35.7)
MCV: 87 fL (ref 79–97)
Platelets: 307 x10E3/uL (ref 150–450)
RBC: 5.32 x10E6/uL — AB (ref 3.77–5.28)
RDW: 14.1 (ref 11.7–15.4)
WBC: 5.8 x10E3/uL (ref 3.4–10.8)

## 2023-08-11 LAB — BASIC METABOLIC PANEL WITH GFR
BUN/Creatinine Ratio: 16 (ref 12–28)
BUN: 19 mg/dL (ref 8–27)
CO2: 19 mmol/L — ABNORMAL LOW (ref 20–29)
Calcium: 10.1 mg/dL (ref 8.7–10.3)
Chloride: 103 mmol/L (ref 96–106)
Creatinine, Ser: 1.17 mg/dL — ABNORMAL HIGH (ref 0.57–1.00)
Glucose: 99 mg/dL (ref 70–99)
Potassium: 4.9 mmol/L (ref 3.5–5.2)
Sodium: 140 mmol/L (ref 134–144)
eGFR: 49 mL/min/1.73 — ABNORMAL LOW (ref >59)

## 2023-08-11 MED ORDER — AZITHROMYCIN 250 MG PO TABS: ORAL_TABLET | ORAL | 0 refills | Status: DC

## 2023-08-11 MED ORDER — FLUCONAZOLE 150 MG PO TABS: 150.0000 mg | ORAL_TABLET | Freq: Once | ORAL | 0 refills | Status: DC | PRN

## 2023-08-11 NOTE — ED Triage Notes (Signed)
 Pt c/o left flank pain and dysuria for 2 weeks. Tried drinking water to try to help with symptoms but hasn't. Denies any blood in urine.

## 2023-08-11 NOTE — ED Provider Notes (Signed)
 [facility]    CSN: [id number] Arrival date & time: 08/11/23  1006      History   Chief Complaint Chief Complaint  Patient presents with   Flank Pain   Urinary Frequency    HPI Jessica Carpenter is a 73 y.o. female.  2 week history of left lower bladder pressure, and left low back pain No hematuria, dysuria, urgency, frequency  No fever, nausea, vomiting, abdominal pain, flank pain Tried increasing water. She also drinks a lot of coffee She does report some straining with stool recently. Last BM yesterday. No blood.  Hx DM   Additionally notes 2 week history of sinus congestion.  No cough  Past Medical History:  Diagnosis Date   Allergic rhinitis    Arthritis    left leg (02/04/2013)   Asthma    CHF (congestive heart failure) (HCC)    Chronic bronchitis (HCC)    usually get it q yr (02/04/2013)   GERD (gastroesophageal reflux disease)    Glaucoma    Gout    used to; haven't had it lately (02/04/2013)   Hypertension    Pacemaker    PONV (postoperative nausea and vomiting)    Sleep apnea    Symptomatic bradycardia    s/p STJ dual chamber pacemaker by Dr Waddell 02/2013   Type II diabetes mellitus [facility])     Patient Active Problem List   Diagnosis Date Noted   TIA (transient ischemic attack) 08/13/2022   Hyperlipidemia 06/29/2018   Pacemaker 05/07/2013   Atrioventricular block, 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   CHF (congestive heart failure) (HCC) 01/31/2013   Diabetes mellitus, type II (HCC) 10/15/2012   Asthma, moderate persistent 08/17/2012   ACUTE BRONCHITIS 02/10/2009   SINUSITIS, ACUTE 06/17/2008   CANDIDIASIS, ORAL 03/14/2008   Intrinsic asthma 09/19/2006   Essential hypertension 09/15/2006   ALLERGIC RHINITIS 09/15/2006   COCAINE ABUSE, HX OF 09/15/1998    Past Surgical History:  Procedure Laterality Date   ABDOMINAL HYSTERECTOMY  ?1980's   BREAST BIOPSY Right    benign   BREAST LUMPECTOMY Right     benign   DILATION AND CURETTAGE OF UTERUS     DIRECT LARYNGOSCOPY N/A 10/19/2022   Procedure: DIRECT LARYNGOSCOPY WITH BIOPSY;  Surgeon: Carlie Clark, MD;  Location: [facility] OR;  Service: ENT;  Laterality: N/A;   GANGLION CYST EXCISION Right 2003   Ganglion of wrist, volar and distal radius   PACEMAKER INSERTION  02/04/2013   STJ [employer]urity dual chamber pacemaker implanted by Dr Waddell for symptomatic bradycardia   PERMANENT PACEMAKER INSERTION N/A 02/04/2013   Procedure: PERMANENT PACEMAKER INSERTION;  Surgeon: Danelle LELON Waddell, MD;  Location: [facility] CATH LAB;  Service: Cardiovascular;  Laterality: N/A;   TUBAL LIGATION      OB History   No obstetric history on file.      Home Medications    Prior to Admission medications   Medication Sig Start Date End Date Taking? Authorizing Provider  azithromycin  (ZITHROMAX ) 250 MG tablet Take 2 tablets together on day 1, then take 1 tablet daily for 4 days 08/11/23  Yes Thelton Graca, [place], PA-C  fluconazole  (DIFLUCAN ) 150 MG tablet Take 1 tablet (150 mg total) by mouth once as needed for up to 2 doses (take one pill on day 1, and the second pill 3 days later). 08/11/23  Yes Izsak Meir, [place], PA-C  albuterol  (VENTOLIN  HFA) 108 (90 Base) MCG/ACT inhaler Inhale 1-2 puffs into the lungs every 4 (  four) hours as needed for wheezing or shortness of breath. 09/27/21   Kara Dorn NOVAK, MD  amLODipine  (NORVASC ) 10 MG tablet Take 10 mg by mouth daily.    [provider]  aspirin  EC 81 MG tablet Take 81 mg by mouth daily.    [provider]  fluticasone  (FLONASE ) 50 MCG/ACT nasal spray Place 1 spray into both nostrils daily. 09/27/21   Kara Dorn NOVAK, MD  fluticasone -salmeterol (ADVAIR) 250-50 MCG/ACT AEPB Inhale 1 puff into the lungs every 12 (twelve) hours. 03/22/23   Kara Dorn NOVAK, MD  hydrochlorothiazide  (HYDRODIURIL ) 25 MG tablet Take 25 mg by mouth daily.     [provider]  latanoprost (XALATAN) 0.005 % ophthalmic solution Place 1  drop into both eyes at bedtime. 07/12/22   [provider]  levETIRAcetam  (KEPPRA ) 250 MG tablet Take 1 tablet (250 mg total) by mouth 2 (two) times daily. Patient taking differently: Take 125 mg by mouth 2 (two) times daily. 01/22/23 08/10/23  Franklyn Sid SAILOR, MD  losartan  (COZAAR ) 100 MG tablet Take 100 mg by mouth daily.    [provider]  lovastatin (MEVACOR) 10 MG tablet Take 10 mg by mouth daily with supper. 03/15/20   [provider]  metFORMIN  (GLUCOPHAGE ) 1000 MG tablet Take 1,000 mg by mouth daily with breakfast. 12/23/20   [provider]  nystatin  (MYCOSTATIN ) 100000 UNIT/ML suspension SMARTSIG:4 Milliliter(s) By Mouth 4 Times Daily 03/18/21   Kara Dorn NOVAK, MD  omeprazole  (PRILOSEC) 20 MG capsule TAKE 1 CAPSULE (20 MG TOTAL) BY MOUTH 2 (TWO) TIMES DAILY BEFORE A MEAL. 06/01/23   Waddell Danelle ORN, MD  Polyethyl Glycol-Propyl Glycol (SYSTANE OP) Place 1 drop into both eyes daily as needed (dry eyes).    [provider]  Spacer/Aero-Holding Chambers (AEROCHAMBER MV) inhaler Use as instructed 09/27/21   Kara Dorn NOVAK, MD    Family History Family History  Problem Relation Age of Onset   Breast cancer Mother    Emphysema Father    Colon polyps Neg Hx    Colon cancer Neg Hx    Esophageal cancer Neg Hx    Rectal cancer Neg Hx    Stomach cancer Neg Hx     Social History Social History   Tobacco Use   Smoking status: Never   Smokeless tobacco: Never  Vaping Use   Vaping status: Never Used  Substance Use Topics   Alcohol use: Not Currently    Comment: occas   Drug use: No     Allergies   Moxifloxacin, Penicillins, and Arimidex [anastrozole]   Review of Systems Review of Systems As per HPI  Physical Exam Triage Vital Signs ED Triage Vitals  Encounter Vitals Group     BP 08/11/23 1039 127/66     Girls Systolic BP Percentile --      Girls Diastolic BP Percentile --      Boys Systolic BP Percentile --      Boys  Diastolic BP Percentile --      Pulse Rate 08/11/23 1039 60     Resp 08/11/23 1039 17     Temp 08/11/23 1039 98 F (36.7 C)     Temp Source 08/11/23 1039 Oral     SpO2 08/11/23 1039 100 %     Weight --      Height --      Head Circumference --      Peak Flow --      Pain Score 08/11/23 1038 7  Pain Loc --      Pain Education --      Exclude from Growth Chart --    No data found.  Updated Vital Signs BP 127/66 (BP Location: Right Arm)   Pulse 60   Temp 98 F (36.7 C) (Oral)   Resp 17   SpO2 100%    Physical Exam Vitals and nursing note reviewed.  Constitutional:      General: She is not in acute distress.    Appearance: Normal appearance. She is not ill-appearing.  HENT:     Right Ear: Tympanic membrane and ear canal normal.     Left Ear: Tympanic membrane and ear canal normal.     Nose: No rhinorrhea.     Mouth/Throat:     Mouth: Mucous membranes are moist.     Pharynx: Oropharynx is clear.  Eyes:     Conjunctiva/sclera: Conjunctivae normal.  Cardiovascular:     Rate and Rhythm: Normal rate and regular rhythm.     Pulses: Normal pulses.     Heart sounds: Normal heart sounds.  Pulmonary:     Effort: Pulmonary effort is normal. No respiratory distress.     Breath sounds: Normal breath sounds. No wheezing, rhonchi or rales.  Abdominal:     General: Bowel sounds are normal.     Palpations: Abdomen is soft.     Tenderness: There is abdominal tenderness in the left lower quadrant. There is no right CVA tenderness, left CVA tenderness, guarding or rebound.     Comments: LLQ discomfort with palpation, non severe. No guarding or rebound  Musculoskeletal:        General: Normal range of motion.  Skin:    General: Skin is warm and dry.  Neurological:     Mental Status: She is alert and oriented to person, place, and time.     UC Treatments / Results  Labs (all labs ordered are listed, but only abnormal results are displayed) Labs Reviewed  POCT URINALYSIS DIP  (MANUAL ENTRY) - Abnormal; Notable for the following components:      Result Value   Clarity, UA cloudy (*)    Bilirubin, UA small (*)    Ketones, POC UA trace (5) (*)    Spec Grav, UA >=1.030 (*)    Protein Ur, POC =100 (*)    All other components within normal limits    EKG  Radiology No results found.  Procedures Procedures  Medications Ordered in UC Medications - No data to display  Initial Impression / Assessment and Plan / UC Course  I have reviewed the triage vital signs and the nursing notes.  Pertinent labs & imaging results that were available during my care of the patient were reviewed by me and considered in my medical decision making (see chart for details).  Minimal discomfort LLQ on exam. No Flank pain. Overall well appearing, afebrile UA elevated spec grav. No sign of infection.  Discussed possibility of constipation, advised miralax and increased water, decreased coffee. Strict ED precautions are verbalized if pain persists or worsens.  With 2 weeks nasal congestion will provide azithromycin . She requests fluconazole  as well.   Final Clinical Impressions(s) / UC Diagnoses   Final diagnoses:  Left lower quadrant abdominal pain  Acute bacterial sinusitis     Discharge Instructions      Azithromycin  as directed for sinus infection Fluconazole  has been sent to prevent yeast infection   Regarding your abdominal pain, please try miralax and increase your water intake.  Try to decrease coffee intake as this can dehydrate you. Please go to the emergency department if symptoms worsen or pain becomes severe     ED Prescriptions     Medication Sig Dispense Auth. Provider   azithromycin  (ZITHROMAX ) 250 MG tablet Take 2 tablets together on day 1, then take 1 tablet daily for 4 days 6 tablet Avory Mimbs, PA-C   fluconazole  (DIFLUCAN ) 150 MG tablet Take 1 tablet (150 mg total) by mouth once as needed for up to 2 doses (take one pill on day 1, and the  second pill 3 days later). 2 tablet Denise Bramblett, [place], PA-C      PDMP not reviewed this encounter.   Corneisha Alvi, [place], [place] 08/11/23 1221

## 2023-08-11 NOTE — Discharge Instructions (Addendum)
 Azithromycin  as directed for sinus infection Fluconazole  has been sent to prevent yeast infection   Regarding your abdominal pain, please try miralax and increase your water intake. Try to decrease coffee intake as this can dehydrate you. Please go to the emergency department if symptoms worsen or pain becomes severe

## 2023-08-12 ENCOUNTER — Ambulatory Visit: Payer: Self-pay | Admitting: Internal Medicine

## 2023-08-14 ENCOUNTER — Telehealth (HOSPITAL_COMMUNITY): Payer: Self-pay

## 2023-08-14 NOTE — Telephone Encounter (Signed)
 Pt is sched for PPM Gen chg on 8/15. Noticed she went to UC on 8/8- given Zpac for acute bacterial sinusitis- should be finished by tomorrow. Will ask Dr. Waddell if we need to r/s procedure?    Attempted to reach patient to discuss upcoming procedure, no answer. Left VM for patient to return call.

## 2023-08-14 NOTE — Telephone Encounter (Signed)
 According to Urgent Care notes pulmonary effort is normal. No respiratory distress, wheezing, rhonchi or rales. Medication is for nasal congestion.

## 2023-08-15 NOTE — Telephone Encounter (Signed)
 No need to reschedule per Dr. Waddell.

## 2023-08-18 ENCOUNTER — Other Ambulatory Visit: Payer: Self-pay

## 2023-08-18 ENCOUNTER — Ambulatory Visit (HOSPITAL_COMMUNITY)
Admission: RE | Admit: 2023-08-18 | Discharge: 2023-08-18 | Disposition: A | Discharge status: Home / Self Care | Attending: Internal Medicine | Admitting: Internal Medicine

## 2023-08-18 ENCOUNTER — Encounter (HOSPITAL_COMMUNITY)
Admission: RE | Disposition: A | Discharge status: Home / Self Care | Payer: Self-pay | Source: Home / Self Care | Attending: Internal Medicine

## 2023-08-18 DIAGNOSIS — I509 Heart failure, unspecified: Secondary | ICD-10-CM | POA: Diagnosis not present

## 2023-08-18 DIAGNOSIS — Z45010 Encounter for checking and testing of cardiac pacemaker pulse generator [battery]: Secondary | ICD-10-CM | POA: Insufficient documentation

## 2023-08-18 DIAGNOSIS — I442 Atrioventricular block, complete: Secondary | ICD-10-CM | POA: Diagnosis not present

## 2023-08-18 DIAGNOSIS — I110 Hypertensive heart disease with heart failure: Secondary | ICD-10-CM | POA: Insufficient documentation

## 2023-08-18 HISTORY — PX: PPM GENERATOR CHANGEOUT: EP1233

## 2023-08-18 LAB — GLUCOSE, CAPILLARY: Glucose-Capillary: 112 mg/dL — ABNORMAL HIGH (ref 70–99)

## 2023-08-18 SURGERY — PPM GENERATOR CHANGEOUT

## 2023-08-18 MED ORDER — ONDANSETRON HCL 4 MG/2ML IJ SOLN: 4.0000 mg | Freq: Four times a day (QID) | INTRAMUSCULAR | Status: DC | PRN

## 2023-08-18 MED ORDER — SODIUM CHLORIDE 0.9 % IV SOLN: INTRAVENOUS | Status: DC

## 2023-08-18 MED ORDER — VANCOMYCIN HCL IN DEXTROSE 1-5 GM/200ML-% IV SOLN: 1000.0000 mg | INTRAVENOUS | Status: AC

## 2023-08-18 MED ORDER — LIDOCAINE HCL (PF) 1 % IJ SOLN
INTRAMUSCULAR | Status: AC
  Filled 2023-08-18: qty 60

## 2023-08-18 MED ORDER — CHLORHEXIDINE GLUCONATE 4 % EX SOLN: 4.0000 | Freq: Once | CUTANEOUS | Status: DC

## 2023-08-18 MED ORDER — LIDOCAINE HCL (PF) 1 % IJ SOLN
INTRAMUSCULAR | Status: AC
Start: 2023-08-18 — End: 2023-08-18
  Filled 2023-08-18: qty 60

## 2023-08-18 MED ORDER — VANCOMYCIN HCL IN DEXTROSE 1-5 GM/200ML-% IV SOLN
INTRAVENOUS | Status: AC
  Administered 2023-08-18: 1000 mg via INTRAVENOUS
  Filled 2023-08-18: qty 200

## 2023-08-18 MED ORDER — SODIUM CHLORIDE 0.9 % IV SOLN: 80.0000 mg | INTRAVENOUS | Status: AC

## 2023-08-18 MED ORDER — SODIUM CHLORIDE 0.9 % IV SOLN
INTRAVENOUS | Status: AC
  Administered 2023-08-18: 80 mg
  Filled 2023-08-18: qty 2

## 2023-08-18 MED ORDER — LIDOCAINE HCL (PF) 1 % IJ SOLN
INTRAMUSCULAR | Status: DC | PRN
  Administered 2023-08-18: 60 mL

## 2023-08-18 MED ORDER — ACETAMINOPHEN 325 MG PO TABS: 325.0000 mg | ORAL_TABLET | ORAL | Status: DC | PRN

## 2023-08-18 SURGICAL SUPPLY — 5 items
CABLE SURGICAL S-101-97-12 (CABLE) ×1 IMPLANT
PACEMAKER ASSURITY DR-RF (Pacemaker) IMPLANT
PAD DEFIB RADIO PHYSIO CONN (PAD) ×1 IMPLANT
POUCH AIGIS-R ANTIBACT PPM MED (Mesh General) IMPLANT
TRAY PACEMAKER INSERTION (PACKS) ×1 IMPLANT

## 2023-08-18 NOTE — Interval H&P Note (Signed)
 History and Physical Interval Note:  08/18/2023 12:18 PM  Jessica Carpenter  has presented today for surgery, with the diagnosis of eri - heart block.  The various methods of treatment have been discussed with the patient and family. After consideration of risks, benefits and other options for treatment, the patient has consented to  Procedure(s): PPM GENERATOR CHANGEOUT (N/A) as a surgical intervention.  The patient's history has been reviewed, patient examined, no change in status, stable for surgery.  I have reviewed the patient's chart and labs.  Questions were answered to the patient's satisfaction.     Danelle [place]

## 2023-08-18 NOTE — Discharge Instructions (Signed)

## 2023-08-20 ENCOUNTER — Encounter (HOSPITAL_COMMUNITY): Payer: Self-pay | Admitting: Internal Medicine

## 2023-09-04 NOTE — Patient Instructions (Signed)

## 2023-09-05 ENCOUNTER — Ambulatory Visit: Attending: Cardiology

## 2023-09-05 DIAGNOSIS — I442 Atrioventricular block, complete: Secondary | ICD-10-CM | POA: Insufficient documentation

## 2023-09-05 LAB — CUP PACEART INCLINIC DEVICE CHECK
Battery Remaining Longevity: 121 mo
Battery Voltage: 3.05 V
Brady Statistic RA Percent Paced: 51 %
Brady Statistic RV Percent Paced: 99.99 %
Date Time Interrogation Session: 20250902173853
Implantable Lead Connection Status: 753985
Implantable Lead Connection Status: 753985
Implantable Lead Implant Date: 20150202
Implantable Lead Implant Date: 20150202
Implantable Lead Location: 753859
Implantable Lead Location: 753860
Implantable Pulse Generator Implant Date: 20250815
Lead Channel Impedance Value: 462.5 Ohm
Lead Channel Impedance Value: 512.5 Ohm
Lead Channel Pacing Threshold Amplitude: 0.5 V
Lead Channel Pacing Threshold Amplitude: 0.5 V
Lead Channel Pacing Threshold Amplitude: 0.875 V
Lead Channel Pacing Threshold Pulse Width: 0.4 ms
Lead Channel Pacing Threshold Pulse Width: 0.4 ms
Lead Channel Pacing Threshold Pulse Width: 0.4 ms
Lead Channel Sensing Intrinsic Amplitude: 5 mV
Lead Channel Setting Pacing Amplitude: 1.125
Lead Channel Setting Pacing Amplitude: 2.5 V
Lead Channel Setting Pacing Pulse Width: 0.4 ms
Lead Channel Setting Sensing Sensitivity: 4 mV
Pulse Gen Model: 2272
Pulse Gen Serial Number: 8296747

## 2023-09-05 NOTE — Progress Notes (Signed)
 Normal dual chamber pacemaker wound check. Presenting rhythm: AP/VP- 60 . Wound well healed. Routine testing performed. Thresholds, sensing, and impedance consistent with implant measurements and at 3.5V safety margin/auto capture until 3 month visit. AMS episodes noted during procedure- no EGM's- will continue to monitor. VP alert- limit turned off due to known CHB. Reviewed arm restrictions to continue for 6 weeks total post op.  Pt enrolled in remote follow-up.

## 2023-09-07 ENCOUNTER — Ambulatory Visit

## 2023-09-25 ENCOUNTER — Encounter (HOSPITAL_COMMUNITY): Payer: Self-pay | Admitting: Emergency Medicine

## 2023-09-25 ENCOUNTER — Ambulatory Visit (HOSPITAL_COMMUNITY)
Admission: EM | Admit: 2023-09-25 | Discharge: 2023-09-25 | Disposition: A | Discharge status: Home / Self Care | Attending: Internal Medicine | Admitting: Internal Medicine

## 2023-09-25 DIAGNOSIS — T781XXA Other adverse food reactions, not elsewhere classified, initial encounter: Secondary | ICD-10-CM

## 2023-09-25 MED ORDER — PREDNISONE 20 MG PO TABS: 40.0000 mg | ORAL_TABLET | Freq: Every day | ORAL | 0 refills | Status: AC

## 2023-09-25 MED ORDER — METHYLPREDNISOLONE SODIUM SUCC 125 MG IJ SOLR
80.0000 mg | Freq: Once | INTRAMUSCULAR | Status: AC
  Administered 2023-09-25: 80 mg via INTRAMUSCULAR

## 2023-09-25 MED ORDER — METHYLPREDNISOLONE SODIUM SUCC 125 MG IJ SOLR
INTRAMUSCULAR | Status: AC
  Filled 2023-09-25: qty 2

## 2023-09-25 NOTE — ED Provider Notes (Signed)
 [facility]    CSN: [id number] Arrival date & time: 09/25/23  9074      History   Chief Complaint Chief Complaint  Patient presents with   Allergic Reaction    HPI Jessica Carpenter is a 73 y.o. female.   Jessica Carpenter is a 73 y.o. female presenting for chief complaint of Allergic Reaction.  Patient ate white fish last night for dinner and developed a full-body itching/hives to the lower back and legs less than 1 hour after eating the fish.  She woke up this morning with worsening diffuse pruritus, puffy eyes, itchy eyes, and hives to the low back.  Denies previous fish allergy or other food allergies.  Denies tongue swelling, throat swelling, lip swelling, nausea, vomiting, diarrhea, fever/chills, myalgias, shortness of breath, chest pain, and heart palpitations.  She has anaphylactic allergy to penicillin and moxifloxacin.  Denies recent changes in laundry detergents, personal hygiene products, bedding, make-up, or other new foods/medication exposures. She took Benadryl yesterday with some relief of itching.  She also took Claritin  last night with some relief.  Recently had a pacemaker placed in August 2025.  History of CHF.  Last echocardiogram from 2024 shows ejection fraction of 55 to 60%.  History of diabetes.  Last hemoglobin A1c in May 2025 was 5.9.  Denies recent steroid use.   Allergic Reaction   Past Medical History:  Diagnosis Date   Allergic rhinitis    Arthritis    left leg (02/04/2013)   Asthma    CHF (congestive heart failure) (HCC)    Chronic bronchitis (HCC)    usually get it q yr (02/04/2013)   GERD (gastroesophageal reflux disease)    Glaucoma    Gout    used to; haven't had it lately (02/04/2013)   Hypertension    Pacemaker    PONV (postoperative nausea and vomiting)    Sleep apnea    Symptomatic bradycardia    s/p STJ dual chamber pacemaker by Dr Waddell 02/2013   Type II diabetes mellitus [facility])     Patient Active Problem List    Diagnosis Date Noted   TIA (transient ischemic attack) 08/13/2022   Hyperlipidemia 06/29/2018   Pacemaker 05/07/2013   Atrioventricular block, 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   CHF (congestive heart failure) (HCC) 01/31/2013   Diabetes mellitus, type II (HCC) 10/15/2012   Asthma, moderate persistent 08/17/2012   ACUTE BRONCHITIS 02/10/2009   SINUSITIS, ACUTE 06/17/2008   CANDIDIASIS, ORAL 03/14/2008   Intrinsic asthma 09/19/2006   Essential hypertension 09/15/2006   ALLERGIC RHINITIS 09/15/2006   COCAINE ABUSE, HX OF 09/15/1998    Past Surgical History:  Procedure Laterality Date   ABDOMINAL HYSTERECTOMY  ?1980's   BREAST BIOPSY Right    benign   BREAST LUMPECTOMY Right    benign   DILATION AND CURETTAGE OF UTERUS     DIRECT LARYNGOSCOPY N/A 10/19/2022   Procedure: DIRECT LARYNGOSCOPY WITH BIOPSY;  Surgeon: Carlie Clark, MD;  Location: [facility] OR;  Service: ENT;  Laterality: N/A;   GANGLION CYST EXCISION Right 2003   Ganglion of wrist, volar and distal radius   PACEMAKER INSERTION  02/04/2013   STJ [employer] dual chamber pacemaker implanted by Dr Waddell for symptomatic bradycardia   PERMANENT PACEMAKER INSERTION N/A 02/04/2013   Procedure: PERMANENT PACEMAKER INSERTION;  Surgeon: Danelle LELON Waddell, MD;  Location: [facility] CATH LAB;  Service: Cardiovascular;  Laterality: N/A;   PPM GENERATOR CHANGEOUT N/A 08/18/2023   Procedure: PPM  GENERATOR CHANGEOUT;  Surgeon: Waddell Danelle ORN, MD;  Location: [facility] INVASIVE CV LAB;  Service: Cardiovascular;  Laterality: N/A;   TUBAL LIGATION      OB History   No obstetric history on file.      Home Medications    Prior to Admission medications   Medication Sig Start Date End Date Taking? Authorizing Provider  predniSONE  (DELTASONE ) 20 MG tablet Take 2 tablets (40 mg total) by mouth daily with breakfast for 5 days. 09/25/23 09/30/23 Yes Enedelia Dorna HERO, FNP  albuterol  (VENTOLIN  HFA) 108 (90 Base) MCG/ACT inhaler  Inhale 1-2 puffs into the lungs every 4 (four) hours as needed for wheezing or shortness of breath. 09/27/21   Kara Dorn NOVAK, MD  amLODipine  (NORVASC ) 10 MG tablet Take 10 mg by mouth daily.    [provider]  aspirin  EC 81 MG tablet Take 81 mg by mouth daily.    [provider]  dorzolamide (TRUSOPT) 2 % ophthalmic solution Place 1 drop into the right eye 2 (two) times daily.    [provider]  fluticasone  (FLONASE ) 50 MCG/ACT nasal spray Place 1 spray into both nostrils daily. Patient taking differently: Place 1 spray into both nostrils daily as needed for allergies or rhinitis. 09/27/21   Kara Dorn NOVAK, MD  fluticasone -salmeterol (ADVAIR) 250-50 MCG/ACT AEPB Inhale 1 puff into the lungs every 12 (twelve) hours. Patient taking differently: Inhale 1 puff into the lungs daily. 03/22/23   Kara Dorn NOVAK, MD  hydrochlorothiazide  (HYDRODIURIL ) 25 MG tablet Take 25 mg by mouth daily.     [provider]  latanoprost (XALATAN) 0.005 % ophthalmic solution Place 1 drop into both eyes at bedtime. 07/12/22   [provider]  losartan  (COZAAR ) 100 MG tablet Take 100 mg by mouth daily.    [provider]  lovastatin (MEVACOR) 10 MG tablet Take 10 mg by mouth daily with supper. 03/15/20   [provider]  metFORMIN  (GLUCOPHAGE ) 500 MG tablet Take 500 mg by mouth daily with breakfast. 12/23/20   [provider]  montelukast  (SINGULAIR ) 10 MG tablet Take 10 mg by mouth daily as needed (Allergies).    [provider]  nystatin  (MYCOSTATIN ) 100000 UNIT/ML suspension SMARTSIG:4 Milliliter(s) By Mouth 4 Times Daily Patient taking differently: Use as directed 5 mLs in the mouth or throat daily as needed (Mouth pain). 03/18/21   Kara Dorn NOVAK, MD  omeprazole  (PRILOSEC) 20 MG capsule TAKE 1 CAPSULE (20 MG TOTAL) BY MOUTH 2 (TWO) TIMES DAILY BEFORE A MEAL. Patient taking differently: Take 20 mg by mouth daily as needed (Acid  reflux). 06/01/23   Waddell Danelle ORN, MD  Spacer/Aero-Holding Raguel (AEROCHAMBER MV) inhaler Use as instructed 09/27/21   Kara Dorn NOVAK, MD    Family History Family History  Problem Relation Age of Onset   Breast cancer Mother    Emphysema Father    Colon polyps Neg Hx    Colon cancer Neg Hx    Esophageal cancer Neg Hx    Rectal cancer Neg Hx    Stomach cancer Neg Hx     Social History Social History   Tobacco Use   Smoking status: Never   Smokeless tobacco: Never  Vaping Use   Vaping status: Never Used  Substance Use Topics   Alcohol use: Not Currently    Comment: occas   Drug use: No     Allergies   Moxifloxacin, Penicillins, and Arimidex [anastrozole]   Review of Systems Review of Systems Per  HPI  Physical Exam Triage Vital Signs ED Triage Vitals [09/25/23 1051]  Encounter Vitals Group     BP 118/71     Girls Systolic BP Percentile      Girls Diastolic BP Percentile      Boys Systolic BP Percentile      Boys Diastolic BP Percentile      Pulse Rate 74     Resp 18     Temp 98.8 F (37.1 C)     Temp Source Oral     SpO2 96 %     Weight      Height      Head Circumference      Peak Flow      Pain Score 0     Pain Loc      Pain Education      Exclude from Growth Chart    No data found.  Updated Vital Signs BP 118/71 (BP Location: Left Arm)   Pulse 74   Temp 98.8 F (37.1 C) (Oral)   Resp 18   SpO2 96%   Visual Acuity Right Eye Distance:   Left Eye Distance:   Bilateral Distance:    Right Eye Near:   Left Eye Near:    Bilateral Near:     Physical Exam Vitals and nursing note reviewed.  Constitutional:      Appearance: She is not ill-appearing or toxic-appearing.  HENT:     Head: Normocephalic and atraumatic.     Right Ear: Hearing, tympanic membrane, ear canal and external ear normal.     Left Ear: Hearing, tympanic membrane, ear canal and external ear normal.     Nose: Nose normal.     Mouth/Throat:     Lips: Pink.      Mouth: Mucous membranes are moist. No injury or oral lesions.     Dentition: Normal dentition.     Tongue: No lesions.     Pharynx: Oropharynx is clear. Uvula midline. No pharyngeal swelling, oropharyngeal exudate, posterior oropharyngeal erythema, uvula swelling or postnasal drip.     Tonsils: No tonsillar exudate.     Comments: No trismus, phonation normal, maintaining secretions without difficulty.  Eyes:     General: Lids are normal. Vision grossly intact. Gaze aligned appropriately.     Extraocular Movements: Extraocular movements intact.     Conjunctiva/sclera: Conjunctivae normal.     Comments: Periorbital regions bilaterally appear puffy and swollen.  No erythema or tenderness.  No rash to face.  Neck:     Trachea: Trachea and phonation normal.  Cardiovascular:     Rate and Rhythm: Normal rate and regular rhythm.     Heart sounds: Normal heart sounds, S1 normal and S2 normal.  Pulmonary:     Effort: Pulmonary effort is normal. No respiratory distress.     Breath sounds: Normal breath sounds and air entry.     Comments: Speaking in full sentences without difficulty. Musculoskeletal:     Cervical back: Neck supple.  Lymphadenopathy:     Cervical: No cervical adenopathy.  Skin:    General: Skin is warm and dry.     Capillary Refill: Capillary refill takes less than 2 seconds.     Findings: Rash (Patchy erythematous hives to the diffuse bilateral lower back and bilateral inner thighs.  Pinpoint maculopapular erythematous rash to the bilateral arms.) present.  Neurological:     General: No focal deficit present.     Mental Status: She is alert and oriented to person, place,  and time. Mental status is at baseline.     Cranial Nerves: No dysarthria or facial asymmetry.  Psychiatric:        Mood and Affect: Mood normal.        Speech: Speech normal.        Behavior: Behavior normal.        Thought Content: Thought content normal.        Judgment: Judgment normal.      UC  Treatments / Results  Labs (all labs ordered are listed, but only abnormal results are displayed) Labs Reviewed - No data to display  EKG   Radiology No results found.  Procedures Procedures (including critical care time)  Medications Ordered in UC Medications  methylPREDNISolone  sodium succinate (SOLU-MEDROL ) 125 mg/2 mL injection 80 mg (80 mg Intramuscular Given 09/25/23 1147)    Initial Impression / Assessment and Plan / UC Course  I have reviewed the triage vital signs and the nursing notes.  Pertinent labs & imaging results that were available during my care of the patient were reviewed by me and considered in my medical decision making (see chart for details).   1.  Allergic reaction to food Presentation consistent with acute hypersensitivity reaction, likely acute allergic reaction.  No signs of anaphylaxis, HEENT exam stable, lungs clear.  Will treat with steroids, antihistamines, H2 blocker (famotidine ) and supportive care as outlined in AVS. Given Solu-Medrol  80 mg IM in clinic for acute symptoms.  Advised to avoid known and potential allergens. Recommend further use of Claritin  daily.  Her creatinine clearance based on last basic metabolic panel/creatinine from a few days ago is 42 mL/min (corrected). She may have normal dosing of Claritin  based on creatinine clearance.  Information for [facility] allergy [facility] asthma specialist given on paperwork.  Advised to call them to schedule an appointment for allergy testing.  Counseled patient on potential for adverse effects with medications prescribed/recommended today, strict ER and return-to-clinic precautions discussed, patient verbalized understanding.    Final Clinical Impressions(s) / UC Diagnoses   Final diagnoses:  Allergic reaction to food, initial encounter     Discharge Instructions      You have been evaluated today for an allergic reaction.  We gave you an injection of steroid to start treating the  allergic reaction today.   Take steroid pills starting tomorrow as prescribed (prednisone - 2 pills once daily for 5 days).   Take Claritin  over-the-counter tablet once daily for the next 7 days to continue to suppress allergic reaction.  Do not eat anything else containing fish until you are able to see an allergy specialist  Please schedule an appointment with your primary care provider for follow-up and ongoing management. Return if you experience rashes, difficulty breathing or swallowing, lip/mouth/tongue swelling, vomiting, or for any other concerning symptoms. If symptoms are severe, please go to the ER for further workup.     ED Prescriptions     Medication Sig Dispense Auth. Provider   predniSONE  (DELTASONE ) 20 MG tablet Take 2 tablets (40 mg total) by mouth daily with breakfast for 5 days. 10 tablet Enedelia Dorna HERO, FNP      PDMP not reviewed this encounter.   Enedelia Dorna HERO, [place] 09/25/23 1153

## 2023-09-25 NOTE — ED Triage Notes (Signed)
 Pt st's she ate fish for dinner last night, st's she has never had a problem eating fish but shortly afterwards she started itching on her arms and legs.  Then when she woke up this am her face and eyes were swollen.  Pt st's she took benadryl last night for the itching without relief.  No resp. Distress noted

## 2023-09-25 NOTE — Discharge Instructions (Addendum)
 You have been evaluated today for an allergic reaction.  We gave you an injection of steroid to start treating the allergic reaction today.   Take steroid pills starting tomorrow as prescribed (prednisone - 2 pills once daily for 5 days).   Take Claritin  over-the-counter tablet once daily for the next 7 days to continue to suppress allergic reaction.  Do not eat anything else containing fish until you are able to see an allergy specialist  Please schedule an appointment with your primary care provider for follow-up and ongoing management. Return if you experience rashes, difficulty breathing or swallowing, lip/mouth/tongue swelling, vomiting, or for any other concerning symptoms. If symptoms are severe, please go to the ER for further workup.

## 2023-10-02 NOTE — Progress Notes (Signed)
 Remote PPM Transmission

## 2023-10-03 ENCOUNTER — Ambulatory Visit: Payer: Self-pay | Admitting: Pulmonary Disease

## 2023-10-03 NOTE — Telephone Encounter (Signed)
 FYI Only or Action Required?: FYI only for provider.  Patient is followed in Pulmonology for asthma, last seen on 12/07/2022 by Kara Dorn NOVAK, MD.  Called Nurse Triage reporting Cough.  Symptoms began a week ago.  Interventions attempted: Prescription medications: Singulair .  Symptoms are: gradually worsening.  Triage Disposition: See Physician Within 24 Hours  Patient/caregiver understands and will follow disposition?: Yes     Copied from CRM #[id number]. Topic: Clinical - Red Word Triage >> Oct 03, 2023 10:23 AM Jessica Carpenter wrote: Red Word that prompted transfer to Nurse Triage: pt stated she is coughing up dark mucus and some congestion       Reason for Disposition  [1] Known COPD or other severe lung disease (i.e., bronchiectasis, cystic fibrosis, lung surgery) AND [2] symptoms getting worse (i.e., increased sputum purulence or amount, increased breathing difficulty  Answer Assessment - Initial Assessment Questions 1. ONSET: When did the cough begin?      1 week ago  2. SEVERITY: How bad is the cough today?      Moderate  3. SPUTUM: Describe the color of your sputum (e.g., none, dry cough; clear, white, yellow, green)     Dark sputum  4. HEMOPTYSIS: Are you coughing up any blood? If Yes, ask: How much? (e.g., flecks, streaks, tablespoons, etc.)     No 5. DIFFICULTY BREATHING: Are you having difficulty breathing? If Yes, ask: How bad is it? (e.g., mild, moderate, severe)      Mild  6. FEVER: Do you have a fever? If Yes, ask: What is your temperature, how was it measured, and when did it start?     No 7. CARDIAC HISTORY: Do you have any history of heart disease? (e.g., heart attack, congestive heart failure)      Yes 8. LUNG HISTORY: Do you have any history of lung disease?  (e.g., pulmonary embolus, asthma, emphysema)     Yes 9. PE RISK FACTORS: Do you have a history of blood clots? (or: recent major surgery, recent prolonged travel,  bedridden)     No 10. OTHER SYMPTOMS: Do you have any other symptoms? (e.g., runny nose, wheezing, chest pain)       Congestion  Protocols used: Cough - Acute Productive-A-AH

## 2023-10-03 NOTE — Telephone Encounter (Signed)
 Patient has OV on 10/1    - NFN

## 2023-10-04 ENCOUNTER — Ambulatory Visit: Admitting: Pulmonary Disease

## 2023-10-04 ENCOUNTER — Encounter: Payer: Self-pay | Admitting: Pulmonary Disease

## 2023-10-04 VITALS — BP 126/84 | HR 69 | Ht 63.0 in | Wt 174.0 lb

## 2023-10-04 DIAGNOSIS — J0110 Acute frontal sinusitis, unspecified: Secondary | ICD-10-CM | POA: Diagnosis not present

## 2023-10-04 DIAGNOSIS — J4530 Mild persistent asthma, uncomplicated: Secondary | ICD-10-CM | POA: Diagnosis not present

## 2023-10-04 MED ORDER — FLUTICASONE-SALMETEROL 115-21 MCG/ACT IN AERO: 2.0000 | INHALATION_SPRAY | Freq: Two times a day (BID) | RESPIRATORY_TRACT | 12 refills | Status: AC

## 2023-10-04 MED ORDER — DOXYCYCLINE HYCLATE 100 MG PO TABS: 100.0000 mg | ORAL_TABLET | Freq: Two times a day (BID) | ORAL | 0 refills | Status: AC

## 2023-10-04 MED ORDER — ALBUTEROL SULFATE HFA 108 (90 BASE) MCG/ACT IN AERS: 1.0000 | INHALATION_SPRAY | RESPIRATORY_TRACT | 6 refills | Status: AC | PRN

## 2023-10-04 NOTE — Progress Notes (Signed)
 Synopsis: Referred in March 2023 for Asthma by Dedra Essex, MD  Subjective:   PATIENT ID: Jessica Carpenter Ned GENDER: female DOB: 11/15/1950, MRN: [id number]  HPI  Chief Complaint  Patient presents with   Medical Management of Chronic Issues    Acute- sputum greenish , head presser    Jessica Carpenter is a 73 year old woman, never smoker with GERD, hypertension, complete heart block s/p pacemaker and DMII who returns to pulmonary clinic for asthma.   Initial OV 03/18/21 She was last seen in clinic in 2016 by Dr. Shelah. PFTs showed mild obstruction based on flow volume loop. She was being treated with advair 250-50mcg 1 puff twice daily at that time. She was also being treated for GERD and allergies at that time as aggravating factors of her asthma.   She was using symbicort  as needed before. She reports it was making her chest and throat sore. She reports episode of respiratory distress where she lost consciousness after coughing and losing her breath. She reports coughing and passing out. PCP changed her to pulmicort  inhaler 90mcg which does not hurt her chest or throat.   She experiences occasoinal dyspnea, cough and wheezing. She notices the dyspnea and wheezing at night sometimes that is relieved by albuterol .   She does report spring allergies.   She is a never smoker. She does report second hand smoke from her father.   OV 03/18/21 PFTs 09/14/21 are within normal limits.  She has been using budesonide  90mcg 2 puffs twice daily and as needed albuterol . She is using albuterol  about once daily. She is on Singulair  daily. She is using flonase /atrovent  nasal sprays.   She has increased sinus congestion and chest congestion. She is having harder time coughing up chest congestion. She hasn't had bronchitis in the past 2 years.   OV 12/07/22 She presents with concerns about the high cost of their inhalers. They report that their insurance, Medicaid, recently stopped covering the cost of  their inhalers, which they now find unaffordable. The patient is currently using an inhaler they found in their drawer, which is due to expire in April of the following year which is symbicort  . They express fear about running out of their inhaler and not being able to breathe properly. She has intermittent wheezing and cough. ACT 12.  OV 10/04/23 She reports with increase cough, mucous production and sinus pressure/pain since last week. She was treated at urgent care for possible allergic reaction to a food with prednisone . Reports increasing cough and mucous production since. Denies fevers, chills and sweats.  She continues on advair 115-21mcg 2 puffs twice daily and albuterol  as needed for asthma. Reports intermittent wheezing over past week.    Past Medical History:  Diagnosis Date   Allergic rhinitis    Arthritis    left leg (02/04/2013)   Asthma    CHF (congestive heart failure) (HCC)    Chronic bronchitis (HCC)    usually get it q yr (02/04/2013)   GERD (gastroesophageal reflux disease)    Glaucoma    Gout    used to; haven't had it lately (02/04/2013)   Hypertension    Pacemaker    PONV (postoperative nausea and vomiting)    Sleep apnea    Symptomatic bradycardia    s/p STJ dual chamber pacemaker by Dr Waddell 02/2013   Type II diabetes mellitus (HCC)      Family History  Problem Relation Age of Onset   Breast cancer Mother  Emphysema Father    Colon polyps Neg Hx    Colon cancer Neg Hx    Esophageal cancer Neg Hx    Rectal cancer Neg Hx    Stomach cancer Neg Hx      Social History   Socioeconomic History   Marital status: Divorced    Spouse name: Not on file   Number of children: Not on file   Years of education: Not on file   Highest education level: Not on file  Occupational History   Occupation: disabled   Tobacco Use   Smoking status: Never   Smokeless tobacco: Never  Vaping Use   Vaping status: Never Used  Substance and Sexual Activity    Alcohol use: Not Currently    Comment: occas   Drug use: No   Sexual activity: Not on file  Other Topics Concern   Not on file  Social History Narrative   Right handed   Retired/disabilty   Caffeine 2 cups   Social Drivers of Health   Financial Resource Strain: Not at Risk (11/23/2022)   Received from [employer] Strain    How hard is it for you to pay for the very basics like food, housing, heating, medical care, and medications?: 1  Food Insecurity: Not at Risk (11/23/2022)   Received from [employer] Insecurity    Within the past 12 months, you worried that your food would run out before you got money to buy more.: 1  Transportation Needs: Not at Risk (11/23/2022)   Received from [facility] Needs    In the past 12 months, has lack of transportation kept you from medical appointments, meetings, work or from getting things needed for daily living?: 1  Physical Activity: Not at Risk (11/23/2022)   Received from [facility]   Physical Activity    Weekly Physical Activity: 1  Stress: Not at Risk (11/23/2022)   Received from [facility]   Stress    Do you feel these kinds of stress these days?: 1  Social Connections: Not at Risk (11/23/2022)   Received from [facility]   Social Connections    How often do you see or talk to people that you care about and feel close to? (For example: talking to friends on phone, visiting friends or family, going to church or club meetings): 1  Intimate Partner Violence: Not At Risk (08/13/2022)   Humiliation, Afraid, Rape, and Kick questionnaire    Fear of Current or Ex-Partner: No    Emotionally Abused: No    Physically Abused: No    Sexually Abused: No     Allergies  Allergen Reactions   Moxifloxacin Hives, Shortness Of Breath and Other (See Comments)    Caused syncope and throat tightness   Penicillins Hives, Shortness Of Breath and Other (See Comments)    Caused syncope and throat tightness Has patient had a PCN reaction  causing immediate rash, facial/tongue/throat swelling, SOB or lightheadedness with hypotension: Yes Has patient had a PCN reaction causing severe rash involving mucus membranes or skin necrosis: No Has patient had a PCN reaction that required hospitalization: hospital visit but not overnight Has patient had a PCN reaction occurring within the last 10 years: No If all of the above answers are NO, then may proceed with Cephalosporin   Arimidex [Anastrozole]      Outpatient Medications Prior to Visit  Medication Sig Dispense Refill   amLODipine  (NORVASC ) 10 MG tablet Take 10 mg by mouth  daily.     aspirin  EC 81 MG tablet Take 81 mg by mouth daily.     dorzolamide (TRUSOPT) 2 % ophthalmic solution Place 1 drop into the right eye 2 (two) times daily.     fluticasone  (FLONASE ) 50 MCG/ACT nasal spray Place 1 spray into both nostrils daily. (Patient taking differently: Place 1 spray into both nostrils daily as needed for allergies or rhinitis.) 16 g 11   hydrochlorothiazide  (HYDRODIURIL ) 25 MG tablet Take 25 mg by mouth daily.      latanoprost (XALATAN) 0.005 % ophthalmic solution Place 1 drop into both eyes at bedtime.     losartan  (COZAAR ) 100 MG tablet Take 100 mg by mouth daily.     lovastatin (MEVACOR) 10 MG tablet Take 10 mg by mouth daily with supper.     metFORMIN  (GLUCOPHAGE ) 500 MG tablet Take 500 mg by mouth daily with breakfast.     montelukast  (SINGULAIR ) 10 MG tablet Take 10 mg by mouth daily as needed (Allergies).     nystatin  (MYCOSTATIN ) 100000 UNIT/ML suspension SMARTSIG:4 Milliliter(s) By Mouth 4 Times Daily (Patient taking differently: Use as directed 5 mLs in the mouth or throat daily as needed (Mouth pain).) 60 mL 6   omeprazole  (PRILOSEC) 20 MG capsule TAKE 1 CAPSULE (20 MG TOTAL) BY MOUTH 2 (TWO) TIMES DAILY BEFORE A MEAL. (Patient taking differently: Take 20 mg by mouth daily as needed (Acid reflux).) 180 capsule 3   Spacer/Aero-Holding Chambers (AEROCHAMBER MV) inhaler Use  as instructed 1 each 0   albuterol  (VENTOLIN  HFA) 108 (90 Base) MCG/ACT inhaler Inhale 1-2 puffs into the lungs every 4 (four) hours as needed for wheezing or shortness of breath. 1 each 6   fluticasone -salmeterol (ADVAIR) 250-50 MCG/ACT AEPB Inhale 1 puff into the lungs every 12 (twelve) hours. (Patient taking differently: Inhale 1 puff into the lungs daily.) 60 each 11   No facility-administered medications prior to visit.   Review of Systems  Constitutional:  Negative for chills, fever, malaise/fatigue and weight loss.  HENT:  Positive for congestion and sinus pain. Negative for sore throat.   Eyes: Negative.   Respiratory:  Positive for cough, sputum production and wheezing. Negative for hemoptysis and shortness of breath.   Cardiovascular:  Negative for chest pain, palpitations, orthopnea, claudication and leg swelling.  Gastrointestinal:  Negative for abdominal pain, heartburn, nausea and vomiting.  Genitourinary: Negative.   Musculoskeletal:  Negative for joint pain and myalgias.  Skin:  Negative for rash.  Neurological:  Negative for weakness.  Endo/Heme/Allergies: Negative.   Psychiatric/Behavioral: Negative.      Objective:   Vitals:   10/04/23 1459  BP: 126/84  Pulse: 69  SpO2: 100%  Weight: 174 lb (78.9 kg)  Height: 5' 3 (1.6 m)     Physical Exam Constitutional:      General: She is not in acute distress.    Appearance: She is not ill-appearing.  HENT:     Head: Normocephalic and atraumatic.  Eyes:     General: No scleral icterus. Cardiovascular:     Rate and Rhythm: Normal rate and regular rhythm.     Pulses: Normal pulses.     Heart sounds: Normal heart sounds. No murmur heard. Pulmonary:     Effort: Pulmonary effort is normal.     Breath sounds: Normal breath sounds. No wheezing, rhonchi or rales.  Musculoskeletal:     Right lower leg: No edema.     Left lower leg: No edema.  Skin:  General: Skin is warm and dry.  Neurological:     General: No  focal deficit present.     Mental Status: She is alert.    CBC    Component Value Date/Time   WBC 5.8 08/10/2023 0939   WBC 6.2 01/22/2023 0656   RBC 5.32 (H) 08/10/2023 0939   RBC 4.66 01/22/2023 0656   HGB 14.7 08/10/2023 0939   HCT 46.3 08/10/2023 0939   PLT 307 08/10/2023 0939   MCV 87 08/10/2023 0939   MCH 27.6 08/10/2023 0939   MCH 27.0 01/22/2023 0656   MCHC 31.7 08/10/2023 0939   MCHC 31.9 01/22/2023 0656   RDW 14.1 08/10/2023 0939   LYMPHSABS 3.1 01/22/2023 0656   MONOABS 0.8 01/22/2023 0656   EOSABS 0.3 01/22/2023 0656   BASOSABS 0.1 01/22/2023 0656      Latest Ref Rng & Units 08/10/2023    9:39 AM 01/22/2023    6:56 AM 10/19/2022    9:25 AM  BMP  Glucose 70 - 99 mg/dL 99  868  99   BUN 8 - 27 mg/dL 19  17  21    Creatinine 0.57 - 1.00 mg/dL 8.82  8.78  8.89   BUN/Creat Ratio 12 - 28 16     Sodium 134 - 144 mmol/L 140  138  143   Potassium 3.5 - 5.2 mmol/L 4.9  3.9  3.7   Chloride 96 - 106 mmol/L 103  105  107   CO2 20 - 29 mmol/L 19  18    Calcium 8.7 - 10.3 mg/dL 89.8  9.3     Chest imaging: CXR 11/23/2017 LEFT-sided transvenous pacemaker with leads to the RIGHT atrium and RIGHT ventricle. The heart size is normal. There are no focal consolidations. No pleural effusions or edema.  PFT:    Latest Ref Rng & Units 09/14/2021   10:14 AM 07/30/2014    3:34 PM  PFT Results  FVC-Pre L 2.30  2.19   FVC-Predicted Pre % 75  83   FVC-Post L 2.31  2.24   FVC-Predicted Post % 75  85   Pre FEV1/FVC % % 83  83   Post FEV1/FCV % % 86  85   FEV1-Pre L 1.91  1.81   FEV1-Predicted Pre % 83  88   FEV1-Post L 1.98  1.89   DLCO uncorrected ml/min/mmHg 15.97  10.97   DLCO UNC% % 79  42   DLCO corrected ml/min/mmHg 15.97    DLCO COR %Predicted % 79    DLVA Predicted % 109  100   TLC L 4.57    TLC % Predicted % 87    RV % Predicted % 98     Labs:  Path:  Echo 2015: LV EF 55-60%. Grade I diastolic dysfunction.   Heart Catheterization:  Assessment & Plan:    Mild persistent asthma without complication - Plan: fluticasone -salmeterol (ADVAIR HFA) 115-21 MCG/ACT inhaler, albuterol  (VENTOLIN  HFA) 108 (90 Base) MCG/ACT inhaler  Acute frontal sinusitis, recurrence not specified - Plan: doxycycline  (VIBRA -TABS) 100 MG tablet  Discussion: Jessica Carpenter is a 73 year old woman, never smoker with GERD, hypertension, complete heart block s/p pacemaker and DMII who returns to pulmonary clinic for asthma.   Asthma -continue adviar HFA 115-21mcg 2 puff twice daily - Use albuterol  inhaler 1-2 puffs every 4-6 hours as needed  Acute Sinus Infection - start doxycycline  1 tab twice daily for 10 days  Follow up in 6 months  Dorn Chill, MD [facility] Office: [phone]   Current Outpatient Medications:    amLODipine  (NORVASC ) 10 MG tablet, Take 10 mg by mouth daily., Disp: , Rfl:    aspirin  EC 81 MG tablet, Take 81 mg by mouth daily., Disp: , Rfl:    dorzolamide (TRUSOPT) 2 % ophthalmic solution, Place 1 drop into the right eye 2 (two) times daily., Disp: , Rfl:    doxycycline  (VIBRA -TABS) 100 MG tablet, Take 1 tablet (100 mg total) by mouth 2 (two) times daily., Disp: 20 tablet, Rfl: 0   fluticasone  (FLONASE ) 50 MCG/ACT nasal spray, Place 1 spray into both nostrils daily. (Patient taking differently: Place 1 spray into both nostrils daily as needed for allergies or rhinitis.), Disp: 16 g, Rfl: 11   fluticasone -salmeterol (ADVAIR HFA) 115-21 MCG/ACT inhaler, Inhale 2 puffs into the lungs 2 (two) times daily., Disp: 1 each, Rfl: 12   hydrochlorothiazide  (HYDRODIURIL ) 25 MG tablet, Take 25 mg by mouth daily. , Disp: , Rfl:    latanoprost (XALATAN) 0.005 % ophthalmic solution, Place 1 drop into both eyes at bedtime., Disp: , Rfl:    losartan  (COZAAR ) 100 MG tablet, Take 100 mg by mouth daily., Disp: , Rfl:    lovastatin (MEVACOR) 10 MG tablet, Take 10 mg by mouth daily with supper., Disp: , Rfl:    metFORMIN  (GLUCOPHAGE ) 500 MG  tablet, Take 500 mg by mouth daily with breakfast., Disp: , Rfl:    montelukast  (SINGULAIR ) 10 MG tablet, Take 10 mg by mouth daily as needed (Allergies)., Disp: , Rfl:    nystatin  (MYCOSTATIN ) 100000 UNIT/ML suspension, SMARTSIG:4 Milliliter(s) By Mouth 4 Times Daily (Patient taking differently: Use as directed 5 mLs in the mouth or throat daily as needed (Mouth pain).), Disp: 60 mL, Rfl: 6   omeprazole  (PRILOSEC) 20 MG capsule, TAKE 1 CAPSULE (20 MG TOTAL) BY MOUTH 2 (TWO) TIMES DAILY BEFORE A MEAL. (Patient taking differently: Take 20 mg by mouth daily as needed (Acid reflux).), Disp: 180 capsule, Rfl: 3   Spacer/Aero-Holding Chambers (AEROCHAMBER MV) inhaler, Use as instructed, Disp: 1 each, Rfl: 0   albuterol  (VENTOLIN  HFA) 108 (90 Base) MCG/ACT inhaler, Inhale 1-2 puffs into the lungs every 4 (four) hours as needed for wheezing or shortness of breath., Disp: 1 each, Rfl: 6

## 2023-10-04 NOTE — Patient Instructions (Addendum)
 Continue advair 115-21mcg 2 puffs twice daily - rinse mouth out after each use  Use albuterol  inhaler 1-2 puffs every 4-6 hours as needed  Start doxycycline  1 tab twice daily for 10 days for concern of sinus infection  Follow up in 6 months, call sooner if needed

## 2023-10-09 ENCOUNTER — Ambulatory Visit

## 2023-10-12 NOTE — Progress Notes (Signed)
 Remote PPM Transmission

## 2023-11-06 ENCOUNTER — Ambulatory Visit: Payer: Medicare Other

## 2023-11-09 ENCOUNTER — Ambulatory Visit

## 2023-11-20 ENCOUNTER — Ambulatory Visit (INDEPENDENT_AMBULATORY_CARE_PROVIDER_SITE_OTHER)

## 2023-11-20 DIAGNOSIS — I442 Atrioventricular block, complete: Secondary | ICD-10-CM | POA: Diagnosis not present

## 2023-11-21 LAB — CUP PACEART REMOTE DEVICE CHECK
Battery Remaining Longevity: 114 mo
Battery Remaining Percentage: 95.5 %
Battery Voltage: 3.02 V
Brady Statistic AP VP Percent: 49 %
Brady Statistic AP VS Percent: 1 %
Brady Statistic AS VP Percent: 51 %
Brady Statistic AS VS Percent: 1 %
Brady Statistic RA Percent Paced: 49 %
Brady Statistic RV Percent Paced: 99 %
Date Time Interrogation Session: 20251117040016
Implantable Lead Connection Status: 753985
Implantable Lead Connection Status: 753985
Implantable Lead Implant Date: 20150202
Implantable Lead Implant Date: 20150202
Implantable Lead Location: 753859
Implantable Lead Location: 753860
Implantable Pulse Generator Implant Date: 20250815
Lead Channel Impedance Value: 410 Ohm
Lead Channel Impedance Value: 430 Ohm
Lead Channel Pacing Threshold Amplitude: 0.5 V
Lead Channel Pacing Threshold Amplitude: 0.875 V
Lead Channel Pacing Threshold Pulse Width: 0.4 ms
Lead Channel Pacing Threshold Pulse Width: 0.4 ms
Lead Channel Sensing Intrinsic Amplitude: 3.1 mV
Lead Channel Setting Pacing Amplitude: 1.125
Lead Channel Setting Pacing Amplitude: 2.5 V
Lead Channel Setting Pacing Pulse Width: 0.4 ms
Lead Channel Setting Sensing Sensitivity: 4 mV
Pulse Gen Model: 2272
Pulse Gen Serial Number: 8296747

## 2023-11-22 ENCOUNTER — Ambulatory Visit: Payer: Self-pay | Admitting: Internal Medicine

## 2023-11-22 NOTE — Progress Notes (Signed)
 Remote PPM Transmission

## 2023-11-27 ENCOUNTER — Encounter: Payer: Self-pay | Admitting: Internal Medicine

## 2023-11-27 ENCOUNTER — Ambulatory Visit: Attending: Internal Medicine | Admitting: Internal Medicine

## 2023-11-27 VITALS — BP 148/79 | HR 60 | Ht 63.0 in | Wt 179.0 lb

## 2023-11-27 DIAGNOSIS — I10 Essential (primary) hypertension: Secondary | ICD-10-CM | POA: Diagnosis not present

## 2023-11-27 DIAGNOSIS — I442 Atrioventricular block, complete: Secondary | ICD-10-CM | POA: Diagnosis not present

## 2023-11-27 LAB — CUP PACEART INCLINIC DEVICE CHECK
Battery Remaining Longevity: 117 mo
Battery Voltage: 3.02 V
Brady Statistic RA Percent Paced: 49 %
Brady Statistic RV Percent Paced: 99.99 %
Date Time Interrogation Session: 20251124151349
Implantable Lead Connection Status: 753985
Implantable Lead Connection Status: 753985
Implantable Lead Implant Date: 20150202
Implantable Lead Implant Date: 20150202
Implantable Lead Location: 753859
Implantable Lead Location: 753860
Implantable Pulse Generator Implant Date: 20250815
Lead Channel Impedance Value: 462.5 Ohm
Lead Channel Impedance Value: 512.5 Ohm
Lead Channel Pacing Threshold Amplitude: 0.5 V
Lead Channel Pacing Threshold Amplitude: 0.5 V
Lead Channel Pacing Threshold Amplitude: 1.25 V
Lead Channel Pacing Threshold Amplitude: 1.25 V
Lead Channel Pacing Threshold Pulse Width: 0.4 ms
Lead Channel Pacing Threshold Pulse Width: 0.4 ms
Lead Channel Pacing Threshold Pulse Width: 0.4 ms
Lead Channel Pacing Threshold Pulse Width: 0.4 ms
Lead Channel Sensing Intrinsic Amplitude: 4.5 mV
Lead Channel Setting Pacing Amplitude: 1.375
Lead Channel Setting Pacing Amplitude: 2.5 V
Lead Channel Setting Pacing Pulse Width: 0.4 ms
Lead Channel Setting Sensing Sensitivity: 4 mV
Pulse Gen Model: 2272
Pulse Gen Serial Number: 8296747

## 2023-11-27 NOTE — Progress Notes (Signed)
 HPI [name] returns today for followup of HTN and CHB, s/p PPM insertion. She has HTN.  She has class 2 CHF symptoms. She has had no trouble healing since her PPM gen change out. She has not had syncope and has minimal palpitations. She denies chest pain or sob. She is pending a cortisone injection.  Allergies  Allergen Reactions   Moxifloxacin Hives, Shortness Of Breath and Other (See Comments)    Caused syncope and throat tightness   Penicillins Hives, Shortness Of Breath and Other (See Comments)    Caused syncope and throat tightness Has patient had a PCN reaction causing immediate rash, facial/tongue/throat swelling, SOB or lightheadedness with hypotension: Yes Has patient had a PCN reaction causing severe rash involving mucus membranes or skin necrosis: No Has patient had a PCN reaction that required hospitalization: hospital visit but not overnight Has patient had a PCN reaction occurring within the last 10 years: No If all of the above answers are NO, then may proceed with Cephalosporin   Arimidex [Anastrozole]      Current Outpatient Medications  Medication Sig Dispense Refill   albuterol  (VENTOLIN  HFA) 108 (90 Base) MCG/ACT inhaler Inhale 1-2 puffs into the lungs every 4 (four) hours as needed for wheezing or shortness of breath. 1 each 6   amLODipine  (NORVASC ) 10 MG tablet Take 10 mg by mouth daily.     aspirin  EC 81 MG tablet Take 81 mg by mouth daily.     dorzolamide (TRUSOPT) 2 % ophthalmic solution Place 1 drop into the right eye 2 (two) times daily.     doxycycline  (VIBRA -TABS) 100 MG tablet Take 1 tablet (100 mg total) by mouth 2 (two) times daily. 20 tablet 0   fluticasone  (FLONASE ) 50 MCG/ACT nasal spray Place 1 spray into both nostrils daily. (Patient taking differently: Place 1 spray into both nostrils daily as needed for allergies or rhinitis.) 16 g 11   fluticasone -salmeterol (ADVAIR HFA) 115-21 MCG/ACT inhaler Inhale 2 puffs into the lungs 2 (two) times  daily. 1 each 12   hydrochlorothiazide  (HYDRODIURIL ) 25 MG tablet Take 25 mg by mouth daily.      latanoprost (XALATAN) 0.005 % ophthalmic solution Place 1 drop into both eyes at bedtime.     losartan  (COZAAR ) 100 MG tablet Take 100 mg by mouth daily.     lovastatin (MEVACOR) 10 MG tablet Take 10 mg by mouth daily with supper.     metFORMIN  (GLUCOPHAGE ) 500 MG tablet Take 500 mg by mouth daily with breakfast.     montelukast  (SINGULAIR ) 10 MG tablet Take 10 mg by mouth daily as needed (Allergies).     nystatin  (MYCOSTATIN ) 100000 UNIT/ML suspension SMARTSIG:4 Milliliter(s) By Mouth 4 Times Daily (Patient taking differently: Use as directed 5 mLs in the mouth or throat daily as needed (Mouth pain).) 60 mL 6   omeprazole  (PRILOSEC) 20 MG capsule TAKE 1 CAPSULE (20 MG TOTAL) BY MOUTH 2 (TWO) TIMES DAILY BEFORE A MEAL. (Patient taking differently: Take 20 mg by mouth daily as needed (Acid reflux).) 180 capsule 3   Spacer/Aero-Holding Chambers (AEROCHAMBER MV) inhaler Use as instructed 1 each 0   No current facility-administered medications for this visit.     Past Medical History:  Diagnosis Date   Allergic rhinitis    Arthritis    left leg (02/04/2013)   Asthma    CHF (congestive heart failure) (HCC)    Chronic bronchitis (HCC)    usually get it q yr (02/04/2013)  GERD (gastroesophageal reflux disease)    Glaucoma    Gout    used to; haven't had it lately (02/04/2013)   Hypertension    Pacemaker    PONV (postoperative nausea and vomiting)    Sleep apnea    Symptomatic bradycardia    s/p STJ dual chamber pacemaker by Dr Waddell 02/2013   Type II diabetes mellitus (HCC)     ROS:   All systems reviewed and negative except as noted in the HPI.   Past Surgical History:  Procedure Laterality Date   ABDOMINAL HYSTERECTOMY  ?1980's   BREAST BIOPSY Right    benign   BREAST LUMPECTOMY Right    benign   DILATION AND CURETTAGE OF UTERUS     DIRECT LARYNGOSCOPY N/A 10/19/2022    Procedure: DIRECT LARYNGOSCOPY WITH BIOPSY;  Surgeon: Carlie Clark, MD;  Location[facility] OR;  Service: ENT;  Laterality: N/A;   GANGLION CYST EXCISION Right 2003   Ganglion of wrist, volar and distal radius   PACEMAKER INSERTION  02/04/2013   STJ [employer] dual chamber pacemaker implanted by Dr Waddell for symptomatic bradycardia   PERMANENT PACEMAKER INSERTION N/A 02/04/2013   Procedure: PERMANENT PACEMAKER INSERTION;  Surgeon: Danelle LELON Waddell, MD;  Location: [facility] CATH LAB;  Service: Cardiovascular;  Laterality: N/A;   PPM GENERATOR CHANGEOUT N/A 08/18/2023   Procedure: PPM GENERATOR CHANGEOUT;  Surgeon: Waddell Danelle LELON, MD;  Location: [facility] INVASIVE CV LAB;  Service: Cardiovascular;  Laterality: N/A;   TUBAL LIGATION       Family History  Problem Relation Age of Onset   Breast cancer Mother    Emphysema Father    Colon polyps Neg Hx    Colon cancer Neg Hx    Esophageal cancer Neg Hx    Rectal cancer Neg Hx    Stomach cancer Neg Hx      Social History   Socioeconomic History   Marital status: Divorced    Spouse name: Not on file   Number of children: Not on file   Years of education: Not on file   Highest education level: Not on file  Occupational History   Occupation: disabled   Tobacco Use   Smoking status: Never   Smokeless tobacco: Never  Vaping Use   Vaping status: Never Used  Substance and Sexual Activity   Alcohol use: Not Currently    Comment: occas   Drug use: No   Sexual activity: Not on file  Other Topics Concern   Not on file  Social History Narrative   Right handed   Retired/disabilty   Caffeine 2 cups   Social Drivers of Health   Financial Resource Strain: Not at Risk (11/23/2022)   Received from [employer] Strain    How hard is it for you to pay for the very basics like food, housing, heating, medical care, and medications?: 1  Food Insecurity: Not at Risk (11/23/2022)   Received from [employer] Insecurity    Within the past 12 months,  you worried that your food would run out before you got money to buy more.: 1  Transportation Needs: Not at Risk (11/23/2022)   Received from [facility] Needs    In the past 12 months, has lack of transportation kept you from medical appointments, meetings, work or from getting things needed for daily living?: 1  Physical Activity: Not at Risk (11/23/2022)   Received from [facility]   Physical Activity    Weekly  Physical Activity: 1  Stress: Not at Risk (11/23/2022)   Received from [facility]   Stress    Do you feel these kinds of stress these days?: 1  Social Connections: Not at Risk (11/23/2022)   Received from [facility]   Social Connections    How often do you see or talk to people that you care about and feel close to? (For example: talking to friends on phone, visiting friends or family, going to church or club meetings): 1  Intimate Partner Violence: Not At Risk (08/13/2022)   Humiliation, Afraid, Rape, and Kick questionnaire    Fear of Current or Ex-Partner: No    Emotionally Abused: No    Physically Abused: No    Sexually Abused: No     BP (!) 148/79   Pulse 60   Ht 5' 3 (1.6 m)   Wt 179 lb (81.2 kg)   SpO2 99%   BMI 31.71 kg/m   Physical Exam:  Well appearing NAD HEENT: Unremarkable Neck:  No JVD, no thyromegally Lymphatics:  No adenopathy Back:  No CVA tenderness Lungs:  Clear HEART:  Regular rate rhythm, no murmurs, no rubs, no clicks Abd:  soft, positive bowel sounds, no organomegally, no rebound, no guarding Ext:  2 plus pulses, no edema, no cyanosis, no clubbing Skin:  No rashes no nodules Neuro:  CN II through XII intact, motor grossly intact  EKG -  AV pacing  DEVICE  Normal device function.  See PaceArt for details.   Assess/Plan:  CHB - she is asymptomatic s/p PPM insertion. PPM - her [employer] Jude DDD PM is working normally, s/p gen change out.  3. HTN - her bp is a little high today but has been good in the past. We will follow.   Danelle  Nazier Neyhart,MD

## 2023-11-27 NOTE — Patient Instructions (Signed)

## 2024-02-05 ENCOUNTER — Ambulatory Visit: Payer: Medicare Other

## 2024-02-19 ENCOUNTER — Encounter

## 2024-05-06 ENCOUNTER — Ambulatory Visit: Payer: Medicare Other

## 2024-05-20 ENCOUNTER — Encounter

## 2024-08-05 ENCOUNTER — Ambulatory Visit: Payer: Medicare Other

## 2024-08-19 ENCOUNTER — Encounter

## 2024-11-18 ENCOUNTER — Encounter
